# Patient Record
Sex: Female | Born: 1945 | Race: White | Hispanic: No | Smoking: Never smoker
Health system: Southern US, Community
[De-identification: ages and names within clinical notes are randomized; demographics above are authoritative.]

## PROBLEM LIST (undated history)

## (undated) DIAGNOSIS — D649 Anemia, unspecified: Secondary | ICD-10-CM

## (undated) DIAGNOSIS — E785 Hyperlipidemia, unspecified: Secondary | ICD-10-CM

## (undated) DIAGNOSIS — K900 Celiac disease: Secondary | ICD-10-CM

## (undated) DIAGNOSIS — R112 Nausea with vomiting, unspecified: Secondary | ICD-10-CM

## (undated) DIAGNOSIS — K449 Diaphragmatic hernia without obstruction or gangrene: Secondary | ICD-10-CM

## (undated) DIAGNOSIS — Z9889 Other specified postprocedural states: Secondary | ICD-10-CM

## (undated) DIAGNOSIS — E0500 Thyrotoxicosis with diffuse goiter without thyrotoxic crisis or storm: Secondary | ICD-10-CM

## (undated) DIAGNOSIS — J45909 Unspecified asthma, uncomplicated: Secondary | ICD-10-CM

## (undated) DIAGNOSIS — R011 Cardiac murmur, unspecified: Secondary | ICD-10-CM

## (undated) DIAGNOSIS — E039 Hypothyroidism, unspecified: Secondary | ICD-10-CM

## (undated) DIAGNOSIS — K219 Gastro-esophageal reflux disease without esophagitis: Secondary | ICD-10-CM

## (undated) DIAGNOSIS — I10 Essential (primary) hypertension: Secondary | ICD-10-CM

## (undated) DIAGNOSIS — Z9289 Personal history of other medical treatment: Secondary | ICD-10-CM

## (undated) DIAGNOSIS — G43909 Migraine, unspecified, not intractable, without status migrainosus: Secondary | ICD-10-CM

## (undated) DIAGNOSIS — Z8739 Personal history of other diseases of the musculoskeletal system and connective tissue: Secondary | ICD-10-CM

## (undated) DIAGNOSIS — J302 Other seasonal allergic rhinitis: Secondary | ICD-10-CM

## (undated) DIAGNOSIS — J189 Pneumonia, unspecified organism: Secondary | ICD-10-CM

## (undated) DIAGNOSIS — M1990 Unspecified osteoarthritis, unspecified site: Secondary | ICD-10-CM

## (undated) DIAGNOSIS — I639 Cerebral infarction, unspecified: Secondary | ICD-10-CM

## (undated) HISTORY — PX: COLONOSCOPY: SHX174

## (undated) HISTORY — DX: Diaphragmatic hernia without obstruction or gangrene: K44.9

## (undated) HISTORY — DX: Gastro-esophageal reflux disease without esophagitis: K21.9

## (undated) HISTORY — DX: Unspecified osteoarthritis, unspecified site: M19.90

## (undated) HISTORY — DX: Cerebral infarction, unspecified: I63.9

## (undated) HISTORY — DX: Anemia, unspecified: D64.9

## (undated) HISTORY — PX: THYROIDECTOMY: SHX17

## (undated) HISTORY — PX: SHOULDER ARTHROSCOPY W/ ROTATOR CUFF REPAIR: SHX2400

## (undated) HISTORY — PX: TONSILLECTOMY: SUR1361

## (undated) HISTORY — DX: Thyrotoxicosis with diffuse goiter without thyrotoxic crisis or storm: E05.00

## (undated) HISTORY — DX: Essential (primary) hypertension: I10

## (undated) HISTORY — DX: Hyperlipidemia, unspecified: E78.5

## (undated) HISTORY — DX: Cardiac murmur, unspecified: R01.1

## (undated) HISTORY — DX: Celiac disease: K90.0

## (undated) HISTORY — DX: Unspecified asthma, uncomplicated: J45.909

## (undated) HISTORY — PX: POLYPECTOMY: SHX149

---

## 1998-07-09 ENCOUNTER — Other Ambulatory Visit: Admission: RE | Admit: 1998-07-09 | Discharge: 1998-07-09 | Payer: Self-pay | Admitting: Obstetrics and Gynecology

## 1999-01-03 ENCOUNTER — Other Ambulatory Visit: Admission: RE | Admit: 1999-01-03 | Discharge: 1999-01-03 | Payer: Self-pay | Admitting: Obstetrics and Gynecology

## 2007-09-09 ENCOUNTER — Ambulatory Visit: Payer: Self-pay | Admitting: Internal Medicine

## 2007-10-19 ENCOUNTER — Ambulatory Visit: Payer: Self-pay | Admitting: Internal Medicine

## 2007-10-19 ENCOUNTER — Encounter: Payer: Self-pay | Admitting: Internal Medicine

## 2007-10-19 DIAGNOSIS — K5730 Diverticulosis of large intestine without perforation or abscess without bleeding: Secondary | ICD-10-CM | POA: Insufficient documentation

## 2007-10-19 DIAGNOSIS — K210 Gastro-esophageal reflux disease with esophagitis: Secondary | ICD-10-CM

## 2007-10-19 DIAGNOSIS — D126 Benign neoplasm of colon, unspecified: Secondary | ICD-10-CM

## 2008-03-16 DIAGNOSIS — E7800 Pure hypercholesterolemia, unspecified: Secondary | ICD-10-CM | POA: Insufficient documentation

## 2008-03-16 DIAGNOSIS — K900 Celiac disease: Secondary | ICD-10-CM | POA: Insufficient documentation

## 2008-03-16 DIAGNOSIS — E785 Hyperlipidemia, unspecified: Secondary | ICD-10-CM

## 2008-03-16 DIAGNOSIS — I10 Essential (primary) hypertension: Secondary | ICD-10-CM | POA: Insufficient documentation

## 2008-03-16 DIAGNOSIS — E039 Hypothyroidism, unspecified: Secondary | ICD-10-CM | POA: Insufficient documentation

## 2008-03-16 DIAGNOSIS — K219 Gastro-esophageal reflux disease without esophagitis: Secondary | ICD-10-CM

## 2009-01-03 ENCOUNTER — Ambulatory Visit: Payer: Self-pay | Admitting: Internal Medicine

## 2009-01-03 DIAGNOSIS — R05 Cough: Secondary | ICD-10-CM

## 2009-01-03 DIAGNOSIS — R930 Abnormal findings on diagnostic imaging of skull and head, not elsewhere classified: Secondary | ICD-10-CM | POA: Insufficient documentation

## 2009-01-21 ENCOUNTER — Telehealth (INDEPENDENT_AMBULATORY_CARE_PROVIDER_SITE_OTHER): Payer: Self-pay | Admitting: *Deleted

## 2009-01-24 ENCOUNTER — Telehealth (INDEPENDENT_AMBULATORY_CARE_PROVIDER_SITE_OTHER): Payer: Self-pay | Admitting: *Deleted

## 2009-01-24 ENCOUNTER — Ambulatory Visit: Payer: Self-pay | Admitting: Internal Medicine

## 2009-01-25 ENCOUNTER — Ambulatory Visit: Payer: Self-pay | Admitting: Cardiovascular Disease

## 2009-02-08 ENCOUNTER — Ambulatory Visit: Payer: Self-pay | Admitting: Internal Medicine

## 2009-02-08 DIAGNOSIS — L509 Urticaria, unspecified: Secondary | ICD-10-CM

## 2009-02-20 ENCOUNTER — Ambulatory Visit: Payer: Self-pay | Admitting: Internal Medicine

## 2009-05-06 ENCOUNTER — Ambulatory Visit (HOSPITAL_COMMUNITY): Admission: RE | Admit: 2009-05-06 | Discharge: 2009-05-06 | Payer: Self-pay | Admitting: Endocrinology

## 2009-05-13 ENCOUNTER — Ambulatory Visit: Payer: Self-pay | Admitting: Internal Medicine

## 2009-06-21 ENCOUNTER — Ambulatory Visit: Payer: Self-pay | Admitting: Internal Medicine

## 2009-06-21 DIAGNOSIS — J328 Other chronic sinusitis: Secondary | ICD-10-CM | POA: Insufficient documentation

## 2009-06-21 LAB — CONVERTED CEMR LAB
Eosinophils Absolute: 0.2 10*3/uL (ref 0.0–0.7)
Eosinophils Relative: 2 % (ref 0.0–5.0)
HCT: 39.5 % (ref 36.0–46.0)
Hemoglobin: 14.2 g/dL (ref 12.0–15.0)
Lymphocytes Relative: 27.2 % (ref 12.0–46.0)
Monocytes Absolute: 0.5 10*3/uL (ref 0.1–1.0)
Neutrophils Relative %: 64.3 % (ref 43.0–77.0)
RBC: 4.14 M/uL (ref 3.87–5.11)
Sed Rate: 23 mm/hr — ABNORMAL HIGH (ref 0–22)
WBC: 7.7 10*3/uL (ref 4.5–10.5)

## 2009-07-26 ENCOUNTER — Ambulatory Visit: Payer: Self-pay | Admitting: Internal Medicine

## 2009-08-16 ENCOUNTER — Ambulatory Visit: Payer: Self-pay | Admitting: Internal Medicine

## 2009-08-16 ENCOUNTER — Telehealth: Payer: Self-pay | Admitting: Internal Medicine

## 2009-08-22 ENCOUNTER — Telehealth: Payer: Self-pay | Admitting: Critical Care Medicine

## 2009-08-26 DIAGNOSIS — G43009 Migraine without aura, not intractable, without status migrainosus: Secondary | ICD-10-CM | POA: Insufficient documentation

## 2009-09-09 ENCOUNTER — Telehealth (INDEPENDENT_AMBULATORY_CARE_PROVIDER_SITE_OTHER): Payer: Self-pay | Admitting: *Deleted

## 2010-04-28 ENCOUNTER — Telehealth: Payer: Self-pay | Admitting: Internal Medicine

## 2010-08-25 ENCOUNTER — Encounter: Admission: RE | Admit: 2010-08-25 | Discharge: 2010-08-25 | Payer: Self-pay | Admitting: Orthopedic Surgery

## 2011-01-29 NOTE — Progress Notes (Signed)
Summary: requesting to change doctors  Phone Note Call from Patient Call back at [phone]   Caller: Patient Call For: wert Reason for Call: Talk to Nurse, Talk to Doctor Summary of Call: Patient is a Dr. Melvyn Novas patient. Patient calling to request to change doctors to Dr. Joya Gaskins. Initial call taken by: Mateo Flow,  August 22, 2009 12:47 PM  Follow-up for Phone Call        Dr. Melvyn Novas please advise.  Follow-up by: Shelton Bing CMA,  August 22, 2009 4:29 PM  Additional Follow-up for Phone Call Additional follow up Details #1::        1)needs to keep ov with Tammy if she wants our help.  2)  already sees Dr Annamaria Boots - she should see him after Tammy NP and they can decide whether a third pulmonary opinion is warranted or not Additional Follow-up by: Tanda Rockers MD,  August 22, 2009 5:17 PM    Additional Follow-up for Phone Call Additional follow up Details #2::    Pt is scheduled to see TP on 9/13 and follows Dr. Annamaria Boots for allergies. Pt request to see Dr. Joya Gaskins for pulmonary. Dr. Joya Gaskins please advise.  Follow-up by: [place] Bing CMA,  August 22, 2009 5:24 PM  Additional Follow-up for Phone Call Additional follow up Details #3:: Details for Additional Follow-up Action Taken: she should see Annamaria Boots and Parrett in my opinion  pw Additional Follow-up by: Elsie Stain MD,  August 22, 2009 5:26 PM   Appended Document: requesting to change doctors Spoke to pt and advised that MW and PW recs were to see TP and CY and discuss her issues with CY. Pt was aggitated with this response, but agreed tothe plan.

## 2011-01-29 NOTE — Progress Notes (Signed)
Summary: Needs f/u chest ct due to MPN- To be done by Dr Forde Dandy  ---- Converted from flag ---- ---- 05/13/2009 3:01 PM, Tanda Rockers MD wrote: ct of chest due for f/u MPN ------------------------------  Dr. Melvyn Novas, it looks like this pt switched to Dr Annamaria Boots, but she never followed up with TP or Dr Annamaria Boots, she cancelled her last 3 appts.  Please advise, thanks Tilden Dome  Apr 28, 2010 10:23 AM  Chest CT 05/06/09 showed multiple pulmonary nodules in LLL but pt declined f/u here. Let pt know we have deferred this issue to Dr Forde Dandy since she declined to f/u here and that we strongly rec the f/u scan but we cannot force her to have one.  Hanna Raskin  Apr 28, 2010 3:21 PM  Spoke with pt and informed of the above.  She agrees to have scan done and will call Dr Forde Dandy to get this set up. Tilden Dome  Apr 29, 2010 2:30 PM   Thank you. Tanda Rockers MD  Apr 29, 2010 4:43 PM

## 2011-05-12 NOTE — Assessment & Plan Note (Signed)
[facility]                         GASTROENTEROLOGY OFFICE NOTE   ARLEN, LEGENDRE                        MRN:          [id number]  DATE:09/09/2007                            DOB:          [birth year]    REFERRING PHYSICIAN:  Ishmael Holter. [place], M.D.   OFFICE CONSULTATION NOTE   REASON FOR CONSULTATION:  Celiac disease.   HISTORY:  This is a 65 year old white female with a history of  hypertension, hypothyroidism, hyperlipidemia, and gastroesophageal  reflux disease.  She is referred through the courtesy of Dr. Forde Dandy  regarding newly identified celiac disease.  The patient's twin sister is  followed in this office and is known to have celiac disease.  As such,  the patient underwent antibody testing July 29, 2007.  Antigliadin,  antiendomysial, and tissue transglutaminase antibodies were all markedly  positive.  The patient also had a low vitamin D level.  Her liver  function tests were normal.  As well, her hemoglobin was normal.  In  addition to intermittent reflux, for which she takes Nexium, she does  report occasional urgency with loose stools, which are somewhat  explosive depending upon diet.  She has started to eliminate gluten from  her diet, and she states this makes her feel better.  She has not had  prior GI evaluations.   PAST MEDICAL HISTORY:  1. Hypertension.  2. Dyslipidemia.  3. Hypothyroidism.   PAST SURGICAL HISTORY:  Thyroid surgery in 1980 and again in 1986.   ALLERGIES:  CODEINE.   CURRENT MEDICATIONS:  1. Synthroid 225 mcg daily.  2. Hydrochlorothiazide 25 mg daily.  3. Nexium 40 mg daily.  4. Altace 10 mg daily.  5. Vitamin D once weekly.  6. Ampicillin 500 mg daily.   FAMILY HISTORY:  A twin sister with celiac sprue.  No gastrointestinal  malignancy.   SOCIAL HISTORY:  The patient is widowed with 3 children.  She lives  alone.  She attended high school.  She works as an Passenger transport manager.  She  does not  smoke.  She occasionally uses alcohol.   REVIEW OF SYSTEMS:  Per diagnostic evaluation form.   PHYSICAL EXAM:  Well-appearing female in no acute distress.  Blood pressure 152/90, heart rate is 88, weight 165.4 pounds.  She is 5  feet 6 inches in height.  HEENT:  Sclerae anicteric, conjunctivae pink, no adenopathy.  LUNGS:  Clear.  HEART:  Regular.  ABDOMEN:  Soft without tenderness, mass, or hernia.  Good bowel sounds  heard.  No skin rash on the extremities.   IMPRESSION:  1. Celiac disease.  2. Colon cancer screening.  Baseline risk.   RECOMMENDATIONS:  1. Upper endoscopy with duodenal biopsies to confirm the diagnosis,      though there is little doubt.  2. Discussed today celiac disease, as well reviewed multiple web sites      regarding the disorder and dietary measures.  Offered her an      opportunity to see a dietician, though she states this was not      particularly helpful for her sister.  3. Colonoscopy  with polypectomy if necessary.  The nature of the      procedure as well as the risks, benefits, and alternatives were      reviewed.  She understood and agreed to proceed.     Docia Chuck. Henrene Pastor, MD  Electronically Signed    JNP/MedQ  DD: 09/09/2007  DT: 09/09/2007  Job #: [id number]   cc:   Annie Main A. Forde Dandy, M.D.

## 2011-09-11 ENCOUNTER — Telehealth: Payer: Self-pay | Admitting: *Deleted

## 2011-09-16 NOTE — Telephone Encounter (Signed)
Error

## 2011-10-09 ENCOUNTER — Other Ambulatory Visit: Payer: Self-pay | Admitting: Dermatology

## 2012-10-06 ENCOUNTER — Encounter: Payer: Self-pay | Admitting: Internal Medicine

## 2013-07-07 ENCOUNTER — Encounter: Payer: Self-pay | Admitting: Internal Medicine

## 2013-11-13 DIAGNOSIS — E890 Postprocedural hypothyroidism: Secondary | ICD-10-CM | POA: Insufficient documentation

## 2015-04-18 ENCOUNTER — Encounter: Payer: Self-pay | Admitting: Internal Medicine

## 2015-12-08 ENCOUNTER — Ambulatory Visit (INDEPENDENT_AMBULATORY_CARE_PROVIDER_SITE_OTHER): Payer: Managed Care, Other (non HMO) | Admitting: Family Medicine

## 2015-12-08 VITALS — BP 120/80 | HR 86 | Temp 98.1°F | Resp 16 | Ht 66.0 in | Wt 155.0 lb

## 2015-12-08 DIAGNOSIS — S93602A Unspecified sprain of left foot, initial encounter: Secondary | ICD-10-CM

## 2015-12-08 MED ORDER — PREDNISONE 20 MG PO TABS: ORAL_TABLET | ORAL | Status: DC

## 2015-12-08 NOTE — Patient Instructions (Signed)
This has the features of a foot strain.

## 2015-12-08 NOTE — Progress Notes (Signed)
69 yo woman with new shoes as of last Thursday with subsequent left foot pain and swelling over the proximal dorsum.  No trauma  Tried ibuprofen  Objective:  NAD BP 120/80 mmHg  Pulse 86  Temp(Src) 98.1 F (36.7 C)  Resp 16  Ht 5\' 6"  (1.676 m)  Wt 155 lb (70.308 kg)  BMI 25.03 kg/m2  SpO2 98% Left foot is tender over the proximal dorsum of the left foot. There is mild swelling over the dorsum of the foot as well without ecchymosis. There is no lateral bony tenderness and she has full range of motion.  Assessment. This chart was scribed in my presence and reviewed by me personally.    ICD-9-CM ICD-10-CM   1. Foot sprain, left, initial encounter 845.10 S93.602A predniSONE (DELTASONE) 20 MG tablet     Signed, Robyn Haber, MD

## 2016-01-17 ENCOUNTER — Telehealth: Payer: Self-pay | Admitting: Internal Medicine

## 2016-01-17 NOTE — Telephone Encounter (Signed)
Patient returned phone call. Best # [phone]

## 2016-01-17 NOTE — Telephone Encounter (Signed)
Left message for patient to call back  

## 2016-01-17 NOTE — Telephone Encounter (Signed)
Patient with a history of celiac disease.  She reports that she has been having lower abdominal cramping and diarrhea.  She will come in and see Nicoletta Ba PA on 01/20/16 2:00

## 2016-01-20 ENCOUNTER — Ambulatory Visit (INDEPENDENT_AMBULATORY_CARE_PROVIDER_SITE_OTHER): Payer: Managed Care, Other (non HMO) | Admitting: Physician Assistant

## 2016-01-20 ENCOUNTER — Encounter: Payer: Self-pay | Admitting: Physician Assistant

## 2016-01-20 ENCOUNTER — Other Ambulatory Visit (INDEPENDENT_AMBULATORY_CARE_PROVIDER_SITE_OTHER): Payer: Managed Care, Other (non HMO)

## 2016-01-20 VITALS — BP 94/60 | HR 92 | Ht 64.75 in | Wt 149.4 lb

## 2016-01-20 DIAGNOSIS — K900 Celiac disease: Secondary | ICD-10-CM | POA: Diagnosis not present

## 2016-01-20 DIAGNOSIS — R197 Diarrhea, unspecified: Secondary | ICD-10-CM

## 2016-01-20 DIAGNOSIS — A09 Infectious gastroenteritis and colitis, unspecified: Secondary | ICD-10-CM

## 2016-01-20 DIAGNOSIS — Z86010 Personal history of colonic polyps: Secondary | ICD-10-CM | POA: Diagnosis not present

## 2016-01-20 LAB — CBC WITH DIFFERENTIAL/PLATELET
BASOS PCT: 0.5 % (ref 0.0–3.0)
Basophils Absolute: 0 10*3/uL (ref 0.0–0.1)
EOS PCT: 2.1 % (ref 0.0–5.0)
Eosinophils Absolute: 0.1 10*3/uL (ref 0.0–0.7)
HCT: 35.1 % — ABNORMAL LOW (ref 36.0–46.0)
HEMOGLOBIN: 11.8 g/dL — AB (ref 12.0–15.0)
LYMPHS ABS: 1.8 10*3/uL (ref 0.7–4.0)
Lymphocytes Relative: 28.7 % (ref 12.0–46.0)
MCHC: 33.6 g/dL (ref 30.0–36.0)
MCV: 93.2 fl (ref 78.0–100.0)
MONO ABS: 0.6 10*3/uL (ref 0.1–1.0)
MONOS PCT: 9 % (ref 3.0–12.0)
Neutro Abs: 3.8 10*3/uL (ref 1.4–7.7)
Neutrophils Relative %: 59.7 % (ref 43.0–77.0)
Platelets: 226 10*3/uL (ref 150.0–400.0)
RBC: 3.76 Mil/uL — ABNORMAL LOW (ref 3.87–5.11)
RDW: 12.8 % (ref 11.5–15.5)
WBC: 6.4 10*3/uL (ref 4.0–10.5)

## 2016-01-20 LAB — COMPREHENSIVE METABOLIC PANEL
ALBUMIN: 4.5 g/dL (ref 3.5–5.2)
ALK PHOS: 41 U/L (ref 39–117)
ALT: 15 U/L (ref 0–35)
AST: 19 U/L (ref 0–37)
BUN: 74 mg/dL — AB (ref 6–23)
CALCIUM: 9.8 mg/dL (ref 8.4–10.5)
CHLORIDE: 101 meq/L (ref 96–112)
CO2: 25 mEq/L (ref 19–32)
Creatinine, Ser: 2.29 mg/dL — ABNORMAL HIGH (ref 0.40–1.20)
GFR: 22.56 mL/min — AB (ref >60.00)
Glucose, Bld: 99 mg/dL (ref 70–99)
POTASSIUM: 3.7 meq/L (ref 3.5–5.1)
SODIUM: 139 meq/L (ref 135–145)
TOTAL PROTEIN: 7.4 g/dL (ref 6.0–8.3)
Total Bilirubin: 0.5 mg/dL (ref 0.2–1.2)

## 2016-01-20 MED ORDER — NA SULFATE-K SULFATE-MG SULF 17.5-3.13-1.6 GM/177ML PO SOLN: 1.0000 | Freq: Once | ORAL | Status: AC

## 2016-01-20 NOTE — Progress Notes (Signed)
Patient ID: Felicia Washington, female   DOB: 1946-07-21, 70 y.o.   MRN: [id number]   Subjective:    Patient ID: Felicia Washington, female    DOB: 1946-06-02, 70 y.o.   MRN: [id number]  HPI  Felicia Washington  Is a pleasant 70 year old white female known to Dr. Henrene Pastor who has not been seen here since 2008. She has a diagnosis of celiac disease and history of diverticulosis and adenomatous colon polyps. Her last colonoscopy was in October 2008 she had marked diverticulosis at that time and 2 polyps were removed 8 mm and 3 mm in size , both tubular adenomas. She was due for follow-up in 2013 .  Patient comes in today with complaints of acute diarrheal illness. She says she had been doing very well and follows a strict gluten-free diet at home, says she may get some gluten when she eats out but other than that has been fairly strict with her diet. He had onset about a week and a half ago of an acute diarrheal illness with multiple watery stools per day nonbloody and at least 8-10 episodes per day. She says a lot of this was occurring in the middle of the night. She had some mild abdominal discomfort no nausea vomiting fever chills etc. She says after couple of days of diarrhea she began developing pretty intense cramping in her legs and toes. She started on an over-the-counter k+ supplement.   says over the past today she's been feeling better and today has not had any diarrhea. She was concerned about whether this was due to celiac disease or some other issue. She's not been on any new medications, no recent antibiotics other than low dose amoxicillin she takes for her face, and no known infectious exposures.  She states she knew she was overdue for colonoscopy.  Review of Systems Pertinent positive and negative review of systems were noted in the above HPI section.  All other review of systems was otherwise negative.  Outpatient Encounter Prescriptions as of 01/20/2016  Medication Sig  . albuterol (PROAIR HFA) 108 (90 Base)  MCG/ACT inhaler Inhale 2 puffs into the lungs every 6 (six) hours as needed for wheezing or shortness of breath.  Marland Kitchen atorvastatin (LIPITOR) 40 MG tablet Take 40 mg by mouth daily.  . Choline Fenofibrate (FENOFIBRIC ACID) 135 MG CPDR Take 1 tablet by mouth daily.  Marland Kitchen levothyroxine (SYNTHROID, LEVOTHROID) 125 MCG tablet Take 125 mcg by mouth daily before breakfast.  . montelukast (SINGULAIR) 10 MG tablet Take 10 mg by mouth at bedtime.  Marland Kitchen olmesartan (BENICAR) 40 MG tablet Take 40 mg by mouth daily.  Marland Kitchen omeprazole (PRILOSEC) 40 MG capsule Take 40 mg by mouth daily.  . hydrochlorothiazide (HYDRODIURIL) 25 MG tablet Take 25 mg by mouth daily. Reported on 01/20/2016  . Na Sulfate-K Sulfate-Mg Sulf SOLN Take 1 kit by mouth once.  . [DISCONTINUED] fenofibrate (TRICOR) 145 MG tablet Take 145 mg by mouth daily. Reported on 01/20/2016  . [DISCONTINUED] predniSONE (DELTASONE) 20 MG tablet Two daily with food   No facility-administered encounter medications on file as of 01/20/2016.   Allergies  Allergen Reactions  . Codeine    Patient Active Problem List   Diagnosis Date Noted  . 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 URTICARIA 02/08/2009  . COUGH 01/03/2009  . Nonspecific (abnormal) findings on radiological and other examination of body structure 01/03/2009  . ABNORMAL LUNG XRAY 01/03/2009  . HYPOTHYROIDISM 03/16/2008  . DYSLIPIDEMIA 03/16/2008  . HYPERTENSION 03/16/2008  . GERD 03/16/2008  .  CELIAC DISEASE 03/16/2008  . COLONIC POLYPS 10/19/2007  . ESOPHAGITIS, REFLUX 10/19/2007  . DIVERTICULOSIS, COLON 68/22/2008   Social History   Social History  . Marital Status: Widowed    Spouse Name: N/A  . Number of Children: 3  . Years of Education: N/A   Occupational History  . sales    Social History Main Topics  . Smoking status: Never Smoker   . Smokeless tobacco: Never Used  . Alcohol Use: No  . Drug Use: No  . Sexual Activity: Not on file   Other Topics Concern  . Not on file    Social History Narrative    [name]'s family history includes Bone cancer in her father; Celiac disease in her sister; Gallstones in her mother; Heart attack in her maternal grandfather; Multiple myeloma in her father; Thyroid disease in her mother.      Objective:    Filed Vitals:   01/20/16 1348  BP: 94/60  Pulse: 92    Physical Exam   Well-developed older white female in no acute distress, blood pressure 94/60 pulse 92 height 5 foot 4 weight 149 . HEENT: nontraumatic normocephalic EOMI PERRLA sclera anicteric, Cardiovascular :regular rate and rhythm with S1-S2 no murmur or gallop, Pulmonary :clear bilaterally, Abdomen soft bowel sounds are present she is nontender there is no palpable mass or hepatosplenomegaly , Rectal :exam not done, Extremities: no clubbing cyanosis or edema skin warm and dry, Neuropsych :mood and affect appropriate     Assessment & Plan:   #1 70 yo female with acute diarrheal illness x 68 days -resolving Suspect infectious/viral gastroenteritis #2 Celiac disease- previously asymptomatic on gluten free diet #3Hx adenomatous polyps- overdue for follow up #4 Diverticulosis #5 HTN #6 GERD  Plan; check CBC,BMET,TTG Stool cultures only if diarrhea recurs  Bland diet, push fluids  Add daily Align x one month Schedule for Colonoscopy with Dr Henrene Pastor- procedures discussed in detail with pt and she is agreeable to proceed    Alfredia Ferguson PA-C 01/20/2016   Cc: No ref. provider found

## 2016-01-20 NOTE — Progress Notes (Signed)
Agree with assessment and plans

## 2016-01-20 NOTE — Patient Instructions (Signed)
Please go to the basement level to have your labs drawn.  Call us if diarrhea reoccurs, we will do stool cultures.  You have been scheduled for a colonoscopy. Please follow written instructions given to you at your visit today.  Please pick up your prep supplies at the pharmacy within the next 1-3 days. [place]  If you use inhalers (even only as needed), please bring them with you on the day of your procedure. Your physician has requested that you go to www.startemmi.com and enter the access code given to you at your visit today. This web site gives a general overview about your procedure. However, you should still follow specific instructions given to you by our office regarding your preparation for the procedure.  Start Align, 1 capsule daily for one month. You can get this at the pharmacy, [address], [place], [place]. [employer].

## 2016-01-21 LAB — TISSUE TRANSGLUTAMINASE, IGG: Tissue Transglut Ab: 3 U/mL (ref <6)

## 2016-03-24 ENCOUNTER — Encounter: Payer: Self-pay | Admitting: Internal Medicine

## 2016-03-24 ENCOUNTER — Ambulatory Visit (AMBULATORY_SURGERY_CENTER): Payer: Managed Care, Other (non HMO) | Admitting: Internal Medicine

## 2016-03-24 VITALS — BP 130/78 | HR 72 | Temp 98.0°F | Resp 24 | Ht 64.75 in | Wt 149.0 lb

## 2016-03-24 DIAGNOSIS — Z86010 Personal history of colonic polyps: Secondary | ICD-10-CM

## 2016-03-24 MED ORDER — SODIUM CHLORIDE 0.9 % IV SOLN: 500.0000 mL | INTRAVENOUS | Status: DC

## 2016-03-24 NOTE — Patient Instructions (Addendum)
FOLLOW DISCHARGE INSTRUCTIONS (BLUE AND GREEN SHEETS).YOU HAD AN ENDOSCOPIC PROCEDURE TODAY AT [facility]:   Refer to the procedure report that was given to you for any specific questions about what was found during the examination.  If the procedure report does not answer your questions, please call your gastroenterologist to clarify.  If you requested that your care partner not be given the details of your procedure findings, then the procedure report has been included in a sealed envelope for you to review at your convenience later.  YOU SHOULD EXPECT: Some feelings of bloating in the abdomen. Passage of more gas than usual.  Walking can help get rid of the air that was put into your GI tract during the procedure and reduce the bloating. If you had a lower endoscopy (such as a colonoscopy or flexible sigmoidoscopy) you may notice spotting of blood in your stool or on the toilet paper. If you underwent a bowel prep for your procedure, you may not have a normal bowel movement for a few days.  Please Note:  You might notice some irritation and congestion in your nose or some drainage.  This is from the oxygen used during your procedure.  There is no need for concern and it should clear up in a day or so.  SYMPTOMS TO REPORT IMMEDIATELY:   Following lower endoscopy (colonoscopy or flexible sigmoidoscopy):  Excessive amounts of blood in the stool  Significant tenderness or worsening of abdominal pains  Swelling of the abdomen that is new, acute  Fever of 100F or higher  For urgent or emergent issues, a gastroenterologist can be reached at any hour by calling [phone].   DIET: Your first meal following the procedure should be a small meal and then it is ok to progress to your normal diet. Heavy or fried foods are harder to digest and may make you feel nauseous or bloated.  Likewise, meals heavy in dairy and vegetables can increase bloating.  Drink plenty of fluids but you  should avoid alcoholic beverages for 24 hours.  ACTIVITY:  You should plan to take it easy for the rest of today and you should NOT DRIVE or use heavy machinery until tomorrow (because of the sedation medicines used during the test).    FOLLOW UP: Our staff will call the number listed on your records the next business day following your procedure to check on you and address any questions or concerns that you may have regarding the information given to you following your procedure. If we do not reach you, we will leave a message.  However, if you are feeling well and you are not experiencing any problems, there is no need to return our call.  We will assume that you have returned to your regular daily activities without incident.  If any biopsies were taken you will be contacted by phone or by letter within the next 1-3 weeks.  Please call us at [phone] if you have not heard about the biopsies in 3 weeks.    SIGNATURES/CONFIDENTIALITY: You and/or your care partner have signed paperwork which will be entered into your electronic medical record.  These signatures attest to the fact that that the information above on your After Visit Summary has been reviewed and is understood.  Full responsibility of the confidentiality of this discharge information lies with you and/or your care-partner.  Diverticulosis information given.  Recall 10 years-2027.

## 2016-03-24 NOTE — Progress Notes (Signed)
Patient awakening,vss,report to rn 

## 2016-03-24 NOTE — Op Note (Signed)
[facility] Patient Name: Felicia Washington Procedure Date: 03/24/2016 8:03 AM MRN: [id number] Endoscopist: Docia Chuck. Henrene Pastor , MD Age: 70 Referring MD:  Date of Birth: [birth year] Gender: Female Procedure:                Colonoscopy Indications:              Surveillance: Personal history of adenomatous                            polyps on last colonoscopy > 5 years ago Medicines:                Monitored Anesthesia Care Procedure:                Pre-Anesthesia Assessment:                           - Prior to the procedure, a History and Physical                            was performed, and patient medications and                            allergies were reviewed. The patient's tolerance of                            previous anesthesia was also reviewed. The risks                            and benefits of the procedure and the sedation                            options and risks were discussed with the patient.                            All questions were answered, and informed consent                            was obtained. Prior Anticoagulants: The patient has                            taken no previous anticoagulant or antiplatelet                            agents. ASA Grade Assessment: II - A patient with                            mild systemic disease. After reviewing the risks                            and benefits, the patient was deemed in                            satisfactory condition to undergo the procedure.  After obtaining informed consent, the colonoscope                            was passed under direct vision. Throughout the                            procedure, the patient's blood pressure, pulse, and                            oxygen saturations were monitored continuously. The                            Model CF-[id number]) scope was introduced                            through the anus and advanced to the the  cecum,                            identified by appendiceal orifice and ileocecal                            valve. The colonoscopy was performed with ease. The                            patient tolerated the procedure well. The quality                            of the bowel preparation was excellent. The bowel                            preparation used was SUPREP. The ileocecal valve,                            appendiceal orifice, and rectum were photographed. Scope In: 8:26:58 AM Scope Out: 8:35:30 AM Scope Withdrawal Time: 0 hours 6 minutes 40 seconds  Total Procedure Duration: 0 hours 8 minutes 32 seconds  Findings:      The perianal and digital rectal examinations were normal.      Multiple small and large-mouthed diverticula were found in the entire       colon.      The exam was otherwise without abnormality on direct and retroflexion       views. Complications:            No immediate complications. Estimated Blood Loss:     Estimated blood loss: none. Impression:               - Diverticulosis in the entire examined colon.                           - The examination was otherwise normal on direct                            and retroflexion views.                           -  No specimens collected. Recommendation:           - Patient has a contact number available for                            emergencies. The signs and symptoms of potential                            delayed complications were discussed with the                            patient. Return to normal activities tomorrow.                            Written discharge instructions were provided to the                            patient.                           - Resume previous diet.                           - Continue present medications.                           - Repeat colonoscopy in 10 years for surveillance.                           - Return to referring physician PRN. Procedure Code(s):        ---  Professional ---                           [phone], Colonoscopy, flexible; diagnostic, including                            collection of specimen(s) by brushing or washing,                            when performed (separate procedure) CPT copyright 2016 American Medical Association. All rights reserved. Docia Chuck. Henrene Pastor, MD 03/24/2016 8:45:43 AM This report has been signed electronically. Number of Addenda: 0 Referring MD:      Reynold Bowen

## 2016-03-25 ENCOUNTER — Telehealth: Payer: Self-pay

## 2016-03-25 NOTE — Telephone Encounter (Signed)
  Follow up Call-  Call back number 03/24/2016  Post procedure Call Back phone  # [phone]  Permission to leave phone message Yes    Patient states she was extremely happy with the care that she received while in the [facility]oscopy Center.   Patient questions:  Do you have a fever, pain , or abdominal swelling? No. Pain Score  0 *  Have you tolerated food without any problems? Yes.    Have you been able to return to your normal activities? Yes.    Do you have any questions about your discharge instructions: Diet   No. Medications  No. Follow up visit  No.  Do you have questions or concerns about your Care? No.  Actions: * If pain score is 4 or above: No action needed, pain <4.

## 2016-08-11 ENCOUNTER — Other Ambulatory Visit: Payer: Self-pay | Admitting: Internal Medicine

## 2016-08-11 DIAGNOSIS — N179 Acute kidney failure, unspecified: Secondary | ICD-10-CM

## 2016-08-11 DIAGNOSIS — N189 Chronic kidney disease, unspecified: Principal | ICD-10-CM

## 2016-08-13 ENCOUNTER — Ambulatory Visit
Admission: RE | Admit: 2016-08-13 | Discharge: 2016-08-13 | Disposition: A | Discharge status: Home / Self Care | Payer: Managed Care, Other (non HMO) | Source: Ambulatory Visit | Attending: Internal Medicine | Admitting: Internal Medicine

## 2016-08-13 DIAGNOSIS — N179 Acute kidney failure, unspecified: Secondary | ICD-10-CM

## 2016-08-13 DIAGNOSIS — N189 Chronic kidney disease, unspecified: Principal | ICD-10-CM

## 2016-09-07 ENCOUNTER — Telehealth: Payer: Self-pay | Admitting: Internal Medicine

## 2016-09-07 NOTE — Telephone Encounter (Signed)
Returned pts call and she states she has "already spoken to someone so she is ok."

## 2018-01-26 DIAGNOSIS — M75100 Unspecified rotator cuff tear or rupture of unspecified shoulder, not specified as traumatic: Secondary | ICD-10-CM | POA: Insufficient documentation

## 2018-06-17 DIAGNOSIS — M109 Gout, unspecified: Secondary | ICD-10-CM | POA: Insufficient documentation

## 2018-08-22 ENCOUNTER — Encounter (HOSPITAL_COMMUNITY): Payer: Self-pay

## 2018-08-22 NOTE — Pre-Procedure Instructions (Signed)
Orphia Mctigue  08/22/2018      [id number] [place], [address] AT [facility] OF [address] [place] [address] Phone: [phone] Fax: [phone]    Your procedure is scheduled on Thursday September 5.  Report to [facility] Admitting at 5:30 A.M.  Call this number if you have problems the morning of surgery:  [phone]   Remember:  Do not eat or drink after midnight.    Take these medicines the morning of surgery with A SIP OF WATER:   Omeprazole (prilosec) Allopurinol (Zyloprim) Ampicillin (principen) Doxycycline (Vibramycin) Levothyroxine (synthroid) Albuterol if needed (please bring to hospital with you)  7 days prior to surgery STOP taking any Aspirin(unless otherwise instructed by your surgeon), Aleve, Naproxen, Ibuprofen, Motrin, Advil, Goody's, BC's, all herbal medications, fish oil, and all vitamins     Do not wear jewelry, make-up or nail polish.  Do not wear lotions, powders, or perfumes, or deodorant.  Do not shave 48 hours prior to surgery.  Men may shave face and neck.  Do not bring valuables to the hospital.  [facility] is not responsible for any belongings or valuables.  Contacts, dentures or bridgework may not be worn into surgery.  Leave your suitcase in the car.  After surgery it may be brought to your room.  For patients admitted to the hospital, discharge time will be determined by your treatment team.  Patients discharged the day of surgery will not be allowed to drive home.   Special instructions:    [facility]- Preparing For Surgery  Before surgery, you can play an important role. Because skin is not sterile, your skin needs to be as free of germs as possible. You can reduce the number of germs on your skin by washing with CHG (chlorahexidine gluconate) Soap before surgery.  CHG is an antiseptic cleaner which kills germs and bonds with the skin to continue killing germs  even after washing.    Oral Hygiene is also important to reduce your risk of infection.  Remember - BRUSH YOUR TEETH THE MORNING OF SURGERY WITH YOUR REGULAR TOOTHPASTE  Please do not use if you have an allergy to CHG or antibacterial soaps. If your skin becomes reddened/irritated stop using the CHG.  Do not shave (including legs and underarms) for at least 48 hours prior to first CHG shower. It is OK to shave your face.  Please follow these instructions carefully.   1. Shower the NIGHT BEFORE SURGERY and the MORNING OF SURGERY with CHG.   2. If you chose to wash your hair, wash your hair first as usual with your normal shampoo.  3. After you shampoo, rinse your hair and body thoroughly to remove the shampoo.  4. Use CHG as you would any other liquid soap. You can apply CHG directly to the skin and wash gently with a scrungie or a clean washcloth.   5. Apply the CHG Soap to your body ONLY FROM THE NECK DOWN.  Do not use on open wounds or open sores. Avoid contact with your eyes, ears, mouth and genitals (private parts). Wash Face and genitals (private parts)  with your normal soap.  6. Wash thoroughly, paying special attention to the area where your surgery will be performed.  7. Thoroughly rinse your body with warm water from the neck down.  8. DO NOT shower/wash with your normal soap after using and rinsing off  the CHG Soap.  9. Pat yourself dry with a CLEAN TOWEL.  10. Wear CLEAN PAJAMAS to bed the night before surgery, wear comfortable clothes the morning of surgery  11. Place CLEAN SHEETS on your bed the night of your first shower and DO NOT SLEEP WITH PETS.    Day of Surgery:  Do not apply any deodorants/lotions.  Please wear clean clothes to the hospital/surgery center.   Remember to brush your teeth WITH YOUR REGULAR TOOTHPASTE.    Please read over the following fact sheets that you were given. Coughing and Deep Breathing, MRSA Information and Surgical Site Infection  Prevention

## 2018-08-23 ENCOUNTER — Encounter (HOSPITAL_COMMUNITY)
Admission: RE | Admit: 2018-08-23 | Discharge: 2018-08-23 | Disposition: A | Discharge status: Home / Self Care | Payer: Managed Care, Other (non HMO) | Source: Ambulatory Visit | Attending: Orthopedic Surgery | Admitting: Orthopedic Surgery

## 2018-08-23 ENCOUNTER — Other Ambulatory Visit: Payer: Self-pay

## 2018-08-23 ENCOUNTER — Encounter (HOSPITAL_COMMUNITY): Payer: Self-pay

## 2018-08-23 DIAGNOSIS — Z01810 Encounter for preprocedural cardiovascular examination: Secondary | ICD-10-CM | POA: Diagnosis present

## 2018-08-23 DIAGNOSIS — Z01812 Encounter for preprocedural laboratory examination: Secondary | ICD-10-CM | POA: Diagnosis present

## 2018-08-23 HISTORY — DX: Nausea with vomiting, unspecified: R11.2

## 2018-08-23 HISTORY — DX: Other specified postprocedural states: Z98.890

## 2018-08-23 LAB — CBC
HEMATOCRIT: 38.8 % (ref 36.0–46.0)
HEMOGLOBIN: 12.9 g/dL (ref 12.0–15.0)
MCH: 31.2 pg (ref 26.0–34.0)
MCHC: 33.2 g/dL (ref 30.0–36.0)
MCV: 93.7 fL (ref 78.0–100.0)
Platelets: 166 10*3/uL (ref 150–400)
RBC: 4.14 MIL/uL (ref 3.87–5.11)
RDW: 12.3 % (ref 11.5–15.5)
WBC: 5.4 10*3/uL (ref 4.0–10.5)

## 2018-08-23 LAB — BASIC METABOLIC PANEL
Anion gap: 11 (ref 5–15)
BUN: 33 mg/dL — AB (ref 8–23)
CALCIUM: 10.2 mg/dL (ref 8.9–10.3)
CHLORIDE: 99 mmol/L (ref 98–111)
CO2: 28 mmol/L (ref 22–32)
CREATININE: 1.17 mg/dL — AB (ref 0.44–1.00)
GFR calc Af Amer: 53 mL/min — ABNORMAL LOW (ref >60)
GFR calc non Af Amer: 46 mL/min — ABNORMAL LOW (ref >60)
Glucose, Bld: 99 mg/dL (ref 70–99)
Potassium: 3.4 mmol/L — ABNORMAL LOW (ref 3.5–5.1)
Sodium: 138 mmol/L (ref 135–145)

## 2018-08-23 LAB — SURGICAL PCR SCREEN
MRSA, PCR: NEGATIVE
Staphylococcus aureus: NEGATIVE

## 2018-08-23 NOTE — Progress Notes (Signed)
PCP - Reynold Bowen MD  EKG -  08/23/18 Stress Test - > 10 years    Anesthesia review: none  Patient denies shortness of breath, fever, cough and chest pain at PAT appointment   Patient verbalized understanding of instructions that were given to them at the PAT appointment. Patient was also instructed that they will need to review over the PAT instructions again at home before surgery.

## 2018-08-31 MED ORDER — SODIUM CHLORIDE 0.9 % IV SOLN
1000.0000 mg | INTRAVENOUS | Status: AC
  Administered 2018-09-01: 1000 mg via INTRAVENOUS
  Filled 2018-08-31: qty 1100

## 2018-09-01 ENCOUNTER — Observation Stay (HOSPITAL_COMMUNITY)
Admission: RE | Admit: 2018-09-01 | Discharge: 2018-09-02 | Disposition: A | Discharge status: Home / Self Care | Payer: Managed Care, Other (non HMO) | Source: Ambulatory Visit | Attending: Orthopedic Surgery | Admitting: Orthopedic Surgery

## 2018-09-01 ENCOUNTER — Ambulatory Visit (HOSPITAL_COMMUNITY): Payer: Managed Care, Other (non HMO) | Admitting: Anesthesiology

## 2018-09-01 ENCOUNTER — Encounter (HOSPITAL_COMMUNITY): Payer: Self-pay | Admitting: Certified Registered"

## 2018-09-01 ENCOUNTER — Other Ambulatory Visit: Payer: Self-pay

## 2018-09-01 ENCOUNTER — Encounter (HOSPITAL_COMMUNITY)
Admission: RE | Disposition: A | Discharge status: Home / Self Care | Payer: Self-pay | Source: Ambulatory Visit | Attending: Orthopedic Surgery

## 2018-09-01 DIAGNOSIS — M13811 Other specified arthritis, right shoulder: Principal | ICD-10-CM | POA: Insufficient documentation

## 2018-09-01 DIAGNOSIS — Z86010 Personal history of colonic polyps: Secondary | ICD-10-CM | POA: Diagnosis not present

## 2018-09-01 DIAGNOSIS — Z79890 Hormone replacement therapy (postmenopausal): Secondary | ICD-10-CM | POA: Diagnosis not present

## 2018-09-01 DIAGNOSIS — J45909 Unspecified asthma, uncomplicated: Secondary | ICD-10-CM | POA: Diagnosis not present

## 2018-09-01 DIAGNOSIS — E785 Hyperlipidemia, unspecified: Secondary | ICD-10-CM | POA: Insufficient documentation

## 2018-09-01 DIAGNOSIS — E0500 Thyrotoxicosis with diffuse goiter without thyrotoxic crisis or storm: Secondary | ICD-10-CM | POA: Insufficient documentation

## 2018-09-01 DIAGNOSIS — Z79899 Other long term (current) drug therapy: Secondary | ICD-10-CM | POA: Diagnosis not present

## 2018-09-01 DIAGNOSIS — K219 Gastro-esophageal reflux disease without esophagitis: Secondary | ICD-10-CM | POA: Diagnosis not present

## 2018-09-01 DIAGNOSIS — M109 Gout, unspecified: Secondary | ICD-10-CM | POA: Insufficient documentation

## 2018-09-01 DIAGNOSIS — Z8371 Family history of colonic polyps: Secondary | ICD-10-CM | POA: Diagnosis not present

## 2018-09-01 DIAGNOSIS — I10 Essential (primary) hypertension: Secondary | ICD-10-CM | POA: Diagnosis not present

## 2018-09-01 DIAGNOSIS — Z96611 Presence of right artificial shoulder joint: Secondary | ICD-10-CM

## 2018-09-01 DIAGNOSIS — Z79891 Long term (current) use of opiate analgesic: Secondary | ICD-10-CM | POA: Diagnosis not present

## 2018-09-01 HISTORY — DX: Hypothyroidism, unspecified: E03.9

## 2018-09-01 HISTORY — DX: Personal history of other medical treatment: Z92.89

## 2018-09-01 HISTORY — DX: Personal history of other diseases of the musculoskeletal system and connective tissue: Z87.39

## 2018-09-01 HISTORY — DX: Other seasonal allergic rhinitis: J30.2

## 2018-09-01 HISTORY — PX: REVERSE SHOULDER ARTHROPLASTY: SHX5054

## 2018-09-01 HISTORY — DX: Pneumonia, unspecified organism: J18.9

## 2018-09-01 HISTORY — DX: Migraine, unspecified, not intractable, without status migrainosus: G43.909

## 2018-09-01 SURGERY — ARTHROPLASTY, SHOULDER, TOTAL, REVERSE
Anesthesia: Regional | Site: Shoulder | Laterality: Right

## 2018-09-01 MED ORDER — ZOLPIDEM TARTRATE 5 MG PO TABS
5.0000 mg | ORAL_TABLET | Freq: Once | ORAL | Status: AC
  Administered 2018-09-01: 5 mg via ORAL
  Filled 2018-09-01: qty 1

## 2018-09-01 MED ORDER — HYDROMORPHONE HCL 1 MG/ML IJ SOLN: 0.5000 mg | INTRAMUSCULAR | Status: DC | PRN

## 2018-09-01 MED ORDER — HYDROCHLOROTHIAZIDE 25 MG PO TABS
25.0000 mg | ORAL_TABLET | Freq: Every day | ORAL | Status: DC
  Administered 2018-09-01 – 2018-09-02 (×2): 25 mg via ORAL
  Filled 2018-09-01 (×3): qty 1

## 2018-09-01 MED ORDER — DOCUSATE SODIUM 100 MG PO CAPS
100.0000 mg | ORAL_CAPSULE | Freq: Two times a day (BID) | ORAL | Status: DC
  Administered 2018-09-01 – 2018-09-02 (×3): 100 mg via ORAL
  Filled 2018-09-01 (×3): qty 1

## 2018-09-01 MED ORDER — FENOFIBRATE 54 MG PO TABS
54.0000 mg | ORAL_TABLET | Freq: Every day | ORAL | Status: DC
  Administered 2018-09-02: 54 mg via ORAL
  Filled 2018-09-01 (×2): qty 1

## 2018-09-01 MED ORDER — LIDOCAINE 2% (20 MG/ML) 5 ML SYRINGE
INTRAMUSCULAR | Status: AC
  Filled 2018-09-01: qty 5

## 2018-09-01 MED ORDER — METHOCARBAMOL 1000 MG/10ML IJ SOLN
500.0000 mg | Freq: Four times a day (QID) | INTRAVENOUS | Status: DC | PRN
  Filled 2018-09-01: qty 5

## 2018-09-01 MED ORDER — LEVOTHYROXINE SODIUM 112 MCG PO TABS
112.0000 ug | ORAL_TABLET | Freq: Every day | ORAL | Status: DC
  Administered 2018-09-02: 112 ug via ORAL
  Filled 2018-09-01: qty 1

## 2018-09-01 MED ORDER — MAGNESIUM CITRATE PO SOLN: 1.0000 | Freq: Once | ORAL | Status: DC | PRN

## 2018-09-01 MED ORDER — MENTHOL 3 MG MT LOZG: 1.0000 | LOZENGE | OROMUCOSAL | Status: DC | PRN

## 2018-09-01 MED ORDER — COLCHICINE 0.6 MG PO TABS
0.6000 mg | ORAL_TABLET | Freq: Every day | ORAL | Status: DC
  Administered 2018-09-01 – 2018-09-02 (×2): 0.6 mg via ORAL
  Filled 2018-09-01 (×2): qty 1

## 2018-09-01 MED ORDER — CHLORHEXIDINE GLUCONATE 4 % EX LIQD: 60.0000 mL | Freq: Once | CUTANEOUS | Status: DC

## 2018-09-01 MED ORDER — ALBUTEROL SULFATE (2.5 MG/3ML) 0.083% IN NEBU: 2.5000 mg | INHALATION_SOLUTION | Freq: Four times a day (QID) | RESPIRATORY_TRACT | Status: DC | PRN

## 2018-09-01 MED ORDER — ATORVASTATIN CALCIUM 40 MG PO TABS
40.0000 mg | ORAL_TABLET | Freq: Every day | ORAL | Status: DC
  Administered 2018-09-01: 40 mg via ORAL
  Filled 2018-09-01: qty 1

## 2018-09-01 MED ORDER — AMPICILLIN 500 MG PO CAPS
500.0000 mg | ORAL_CAPSULE | Freq: Every day | ORAL | Status: DC
  Administered 2018-09-02: 500 mg via ORAL
  Filled 2018-09-01: qty 1

## 2018-09-01 MED ORDER — SUGAMMADEX SODIUM 200 MG/2ML IV SOLN
INTRAVENOUS | Status: DC | PRN
  Administered 2018-09-01: 150 mg via INTRAVENOUS

## 2018-09-01 MED ORDER — EPHEDRINE SULFATE-NACL 50-0.9 MG/10ML-% IV SOSY
PREFILLED_SYRINGE | INTRAVENOUS | Status: DC | PRN
  Administered 2018-09-01 (×2): 5 mg via INTRAVENOUS

## 2018-09-01 MED ORDER — LIDOCAINE 2% (20 MG/ML) 5 ML SYRINGE
INTRAMUSCULAR | Status: DC | PRN
  Administered 2018-09-01: 40 mg via INTRAVENOUS

## 2018-09-01 MED ORDER — ALLOPURINOL 100 MG PO TABS
100.0000 mg | ORAL_TABLET | Freq: Two times a day (BID) | ORAL | Status: DC
  Administered 2018-09-01 – 2018-09-02 (×2): 100 mg via ORAL
  Filled 2018-09-01 (×2): qty 1

## 2018-09-01 MED ORDER — FENTANYL CITRATE (PF) 100 MCG/2ML IJ SOLN
INTRAMUSCULAR | Status: DC | PRN
  Administered 2018-09-01 (×2): 50 ug via INTRAVENOUS

## 2018-09-01 MED ORDER — ONDANSETRON HCL 4 MG/2ML IJ SOLN
INTRAMUSCULAR | Status: DC | PRN
  Administered 2018-09-01 (×2): 4 mg via INTRAVENOUS

## 2018-09-01 MED ORDER — SODIUM CHLORIDE 0.9 % IV SOLN
INTRAVENOUS | Status: DC | PRN
  Administered 2018-09-01: 40 ug/min via INTRAVENOUS

## 2018-09-01 MED ORDER — MIDAZOLAM HCL 2 MG/2ML IJ SOLN
INTRAMUSCULAR | Status: AC
  Filled 2018-09-01: qty 2

## 2018-09-01 MED ORDER — LACTATED RINGERS IV SOLN
INTRAVENOUS | Status: DC
  Administered 2018-09-01: 17:00:00 via INTRAVENOUS

## 2018-09-01 MED ORDER — DOXYCYCLINE HYCLATE 50 MG PO CAPS
50.0000 mg | ORAL_CAPSULE | Freq: Two times a day (BID) | ORAL | Status: DC
  Administered 2018-09-01 – 2018-09-02 (×2): 50 mg via ORAL
  Filled 2018-09-01 (×2): qty 1

## 2018-09-01 MED ORDER — LACTATED RINGERS IV SOLN
INTRAVENOUS | Status: DC | PRN
  Administered 2018-09-01 (×2): via INTRAVENOUS

## 2018-09-01 MED ORDER — EPHEDRINE 5 MG/ML INJ
INTRAVENOUS | Status: AC
  Filled 2018-09-01: qty 10

## 2018-09-01 MED ORDER — PHENOL 1.4 % MT LIQD: 1.0000 | OROMUCOSAL | Status: DC | PRN

## 2018-09-01 MED ORDER — DIPHENHYDRAMINE HCL 12.5 MG/5ML PO ELIX: 12.5000 mg | ORAL_SOLUTION | ORAL | Status: DC | PRN

## 2018-09-01 MED ORDER — OXYCODONE HCL 5 MG/5ML PO SOLN: 5.0000 mg | Freq: Once | ORAL | Status: DC | PRN

## 2018-09-01 MED ORDER — DEXAMETHASONE SODIUM PHOSPHATE 10 MG/ML IJ SOLN
INTRAMUSCULAR | Status: DC | PRN
  Administered 2018-09-01: 10 mg via INTRAVENOUS

## 2018-09-01 MED ORDER — ONDANSETRON HCL 4 MG/2ML IJ SOLN
INTRAMUSCULAR | Status: AC
  Filled 2018-09-01: qty 2

## 2018-09-01 MED ORDER — IVERMECTIN 1 % EX CREA: 1.0000 "application " | TOPICAL_CREAM | Freq: Every morning | CUTANEOUS | Status: DC

## 2018-09-01 MED ORDER — DEXAMETHASONE SODIUM PHOSPHATE 10 MG/ML IJ SOLN
INTRAMUSCULAR | Status: AC
  Filled 2018-09-01: qty 1

## 2018-09-01 MED ORDER — MIDAZOLAM HCL 5 MG/5ML IJ SOLN
INTRAMUSCULAR | Status: DC | PRN
  Administered 2018-09-01: 1 mg via INTRAVENOUS

## 2018-09-01 MED ORDER — KETOROLAC TROMETHAMINE 15 MG/ML IJ SOLN
7.5000 mg | Freq: Four times a day (QID) | INTRAMUSCULAR | Status: AC
  Administered 2018-09-01 – 2018-09-02 (×4): 7.5 mg via INTRAVENOUS
  Filled 2018-09-01 (×4): qty 1

## 2018-09-01 MED ORDER — METOCLOPRAMIDE HCL 5 MG PO TABS: 5.0000 mg | ORAL_TABLET | Freq: Three times a day (TID) | ORAL | Status: DC | PRN

## 2018-09-01 MED ORDER — METHOCARBAMOL 500 MG PO TABS
500.0000 mg | ORAL_TABLET | Freq: Four times a day (QID) | ORAL | Status: DC | PRN
  Administered 2018-09-01: 500 mg via ORAL
  Filled 2018-09-01: qty 1

## 2018-09-01 MED ORDER — PROPOFOL 10 MG/ML IV BOLUS
INTRAVENOUS | Status: AC
  Filled 2018-09-01: qty 20

## 2018-09-01 MED ORDER — POLYETHYLENE GLYCOL 3350 17 G PO PACK: 17.0000 g | PACK | Freq: Every day | ORAL | Status: DC | PRN

## 2018-09-01 MED ORDER — 0.9 % SODIUM CHLORIDE (POUR BTL) OPTIME
TOPICAL | Status: DC | PRN
  Administered 2018-09-01: 1000 mL

## 2018-09-01 MED ORDER — ROCURONIUM BROMIDE 50 MG/5ML IV SOSY
PREFILLED_SYRINGE | INTRAVENOUS | Status: AC
  Filled 2018-09-01: qty 5

## 2018-09-01 MED ORDER — METOCLOPRAMIDE HCL 5 MG/ML IJ SOLN: 5.0000 mg | Freq: Three times a day (TID) | INTRAMUSCULAR | Status: DC | PRN

## 2018-09-01 MED ORDER — MONTELUKAST SODIUM 10 MG PO TABS
10.0000 mg | ORAL_TABLET | Freq: Every day | ORAL | Status: DC
  Administered 2018-09-01: 10 mg via ORAL
  Filled 2018-09-01: qty 1

## 2018-09-01 MED ORDER — ROCURONIUM BROMIDE 100 MG/10ML IV SOLN
INTRAVENOUS | Status: DC | PRN
  Administered 2018-09-01: 50 mg via INTRAVENOUS

## 2018-09-01 MED ORDER — CEFAZOLIN SODIUM-DEXTROSE 2-4 GM/100ML-% IV SOLN
2.0000 g | INTRAVENOUS | Status: AC
  Administered 2018-09-01: 2 g via INTRAVENOUS
  Filled 2018-09-01: qty 100

## 2018-09-01 MED ORDER — OXYCODONE HCL 5 MG PO TABS
5.0000 mg | ORAL_TABLET | ORAL | Status: DC | PRN
  Administered 2018-09-01: 5 mg via ORAL
  Filled 2018-09-01: qty 1

## 2018-09-01 MED ORDER — ALUM & MAG HYDROXIDE-SIMETH 200-200-20 MG/5ML PO SUSP: 30.0000 mL | ORAL | Status: DC | PRN

## 2018-09-01 MED ORDER — ONDANSETRON HCL 4 MG/2ML IJ SOLN: 4.0000 mg | Freq: Four times a day (QID) | INTRAMUSCULAR | Status: DC | PRN

## 2018-09-01 MED ORDER — ONDANSETRON HCL 4 MG PO TABS: 4.0000 mg | ORAL_TABLET | Freq: Four times a day (QID) | ORAL | Status: DC | PRN

## 2018-09-01 MED ORDER — PROPOFOL 10 MG/ML IV BOLUS
INTRAVENOUS | Status: DC | PRN
  Administered 2018-09-01: 20 mg via INTRAVENOUS
  Administered 2018-09-01: 100 mg via INTRAVENOUS

## 2018-09-01 MED ORDER — FENTANYL CITRATE (PF) 100 MCG/2ML IJ SOLN: 25.0000 ug | INTRAMUSCULAR | Status: DC | PRN

## 2018-09-01 MED ORDER — OXYCODONE HCL 5 MG PO TABS: 5.0000 mg | ORAL_TABLET | Freq: Once | ORAL | Status: DC | PRN

## 2018-09-01 MED ORDER — BISACODYL 5 MG PO TBEC: 5.0000 mg | DELAYED_RELEASE_TABLET | Freq: Every day | ORAL | Status: DC | PRN

## 2018-09-01 MED ORDER — FENTANYL CITRATE (PF) 250 MCG/5ML IJ SOLN
INTRAMUSCULAR | Status: AC
  Filled 2018-09-01: qty 5

## 2018-09-01 MED ORDER — ACETAMINOPHEN 325 MG PO TABS: 325.0000 mg | ORAL_TABLET | Freq: Four times a day (QID) | ORAL | Status: DC | PRN

## 2018-09-01 MED ORDER — BUPIVACAINE HCL (PF) 0.5 % IJ SOLN
INTRAMUSCULAR | Status: DC | PRN
  Administered 2018-09-01: 15 mL via PERINEURAL

## 2018-09-01 MED ORDER — OXYCODONE HCL 5 MG PO TABS: 5.0000 mg | ORAL_TABLET | ORAL | Status: DC | PRN

## 2018-09-01 MED ORDER — BUPIVACAINE LIPOSOME 1.3 % IJ SUSP
INTRAMUSCULAR | Status: DC | PRN
  Administered 2018-09-01: 10 mL via PERINEURAL

## 2018-09-01 SURGICAL SUPPLY — 73 items
ADH SKN CLS APL DERMABOND .7 (GAUZE/BANDAGES/DRESSINGS) ×1
ADH SKN CLS LQ APL DERMABOND (GAUZE/BANDAGES/DRESSINGS) ×1
AID PSTN UNV HD RSTRNT DISP (MISCELLANEOUS) ×1
BASEPLATE GLENOID SHLDR SM (Shoulder) ×2 IMPLANT
BLADE SAW SGTL 83.5X18.5 (BLADE) ×3 IMPLANT
BSPLAT GLND SM PRFT SHLDR CA (Shoulder) ×1 IMPLANT
COVER SURGICAL LIGHT HANDLE (MISCELLANEOUS) ×3 IMPLANT
CUP SUT UNIV REVERS 36+2 RT (Cup) ×2 IMPLANT
DERMABOND ADHESIVE PROPEN (GAUZE/BANDAGES/DRESSINGS) ×2
DERMABOND ADVANCED (GAUZE/BANDAGES/DRESSINGS) ×2
DERMABOND ADVANCED .7 DNX12 (GAUZE/BANDAGES/DRESSINGS) ×1 IMPLANT
DERMABOND ADVANCED .7 DNX6 (GAUZE/BANDAGES/DRESSINGS) IMPLANT
DRAPE ORTHO SPLIT 77X108 STRL (DRAPES) ×6
DRAPE SURG 17X11 SM STRL (DRAPES) ×3 IMPLANT
DRAPE SURG ORHT 6 SPLT 77X108 (DRAPES) ×2 IMPLANT
DRAPE U-SHAPE 47X51 STRL (DRAPES) ×3 IMPLANT
DRSG AQUACEL AG ADV 3.5X10 (GAUZE/BANDAGES/DRESSINGS) ×3 IMPLANT
DURAPREP 26ML APPLICATOR (WOUND CARE) ×3 IMPLANT
ELECT BLADE 4.0 EZ CLEAN MEGAD (MISCELLANEOUS) ×3
ELECT CAUTERY BLADE 6.4 (BLADE) ×3 IMPLANT
ELECT REM PT RETURN 9FT ADLT (ELECTROSURGICAL) ×3
ELECTRODE BLDE 4.0 EZ CLN MEGD (MISCELLANEOUS) ×1 IMPLANT
ELECTRODE REM PT RTRN 9FT ADLT (ELECTROSURGICAL) ×1 IMPLANT
FACESHIELD WRAPAROUND (MASK) ×12 IMPLANT
FACESHIELD WRAPAROUND OR TEAM (MASK) ×3 IMPLANT
GLENOSPHERE LATERAL 36MM+4 (Shoulder) ×2 IMPLANT
GLOVE BIO SURGEON STRL SZ7.5 (GLOVE) ×3 IMPLANT
GLOVE BIO SURGEON STRL SZ8 (GLOVE) ×3 IMPLANT
GLOVE BIOGEL PI ORTHO PRO 7.5 (GLOVE) ×2
GLOVE EUDERMIC 7 POWDERFREE (GLOVE) ×3 IMPLANT
GLOVE INDICATOR 7.5 STRL GRN (GLOVE) ×2 IMPLANT
GLOVE PI ORTHO PRO STRL 7.5 (GLOVE) IMPLANT
GLOVE SS BIOGEL STRL SZ 7.5 (GLOVE) ×1 IMPLANT
GLOVE SUPERSENSE BIOGEL SZ 7.5 (GLOVE) ×2
GLOVE SURG SS PI 6.0 STRL IVOR (GLOVE) ×2 IMPLANT
GLOVE SURG SS PI 7.0 STRL IVOR (GLOVE) ×2 IMPLANT
GLOVE SURG SS PI 8.0 STRL IVOR (GLOVE) ×2 IMPLANT
GLOVE SURG SYN 7.5  E (GLOVE) ×2
GLOVE SURG SYN 7.5 E (GLOVE) ×1 IMPLANT
GLOVE SURG SYN 7.5 PF PI (GLOVE) IMPLANT
GLOVE SURG SYN 8.0 (GLOVE) ×3 IMPLANT
GLOVE SURG SYN 8.0 PF PI (GLOVE) IMPLANT
GOWN STRL REUS W/ TWL LRG LVL3 (GOWN DISPOSABLE) ×1 IMPLANT
GOWN STRL REUS W/ TWL XL LVL3 (GOWN DISPOSABLE) ×2 IMPLANT
GOWN STRL REUS W/TWL LRG LVL3 (GOWN DISPOSABLE) ×6
GOWN STRL REUS W/TWL XL LVL3 (GOWN DISPOSABLE) ×6
INSERT HUMERAL 36 +6 (Shoulder) ×2 IMPLANT
KIT BASIN OR (CUSTOM PROCEDURE TRAY) ×3 IMPLANT
KIT TURNOVER KIT B (KITS) ×3 IMPLANT
MANIFOLD NEPTUNE II (INSTRUMENTS) ×3 IMPLANT
NDL TAPERED W/ NITINOL LOOP (MISCELLANEOUS) ×1 IMPLANT
NEEDLE TAPERED W/ NITINOL LOOP (MISCELLANEOUS) ×3 IMPLANT
NS IRRIG 1000ML POUR BTL (IV SOLUTION) ×3 IMPLANT
PACK SHOULDER (CUSTOM PROCEDURE TRAY) ×3 IMPLANT
PAD ARMBOARD 7.5X6 YLW CONV (MISCELLANEOUS) ×6 IMPLANT
RESTRAINT HEAD UNIVERSAL NS (MISCELLANEOUS) ×3 IMPLANT
SCREW CENTRAL NONLOCK 25MM (Screw) ×2 IMPLANT
SCREW LOCK PERIPHERAL 30MM (Shoulder) ×2 IMPLANT
SCREW LOCK PERIPHERAL 36MM (Screw) ×2 IMPLANT
SET PIN UNIVERSAL REVERSE (SET/KITS/TRAYS/PACK) ×2 IMPLANT
SLING ARM FOAM STRAP LRG (SOFTGOODS) ×2 IMPLANT
SLING ARM FOAM STRAP MED (SOFTGOODS) ×2 IMPLANT
SPONGE LAP 18X18 X RAY DECT (DISPOSABLE) ×3 IMPLANT
SPONGE LAP 4X18 RFD (DISPOSABLE) ×3 IMPLANT
STEM HUMERAL MOD SZ 5 135 DEG (Stem) ×2 IMPLANT
SUT MNCRL AB 3-0 PS2 18 (SUTURE) ×3 IMPLANT
SUT MON AB 2-0 CT1 27 (SUTURE) ×3 IMPLANT
SUT VIC AB 1 CT1 27 (SUTURE) ×3
SUT VIC AB 1 CT1 27XBRD ANBCTR (SUTURE) ×1 IMPLANT
SUTURE TAPE 1.3 40 TPR END (SUTURE) ×1 IMPLANT
SUTURETAPE 1.3 40 TPR END (SUTURE) ×6
TOWEL OR 17X26 10 PK STRL BLUE (TOWEL DISPOSABLE) ×3 IMPLANT
WATER STERILE IRR 1000ML POUR (IV SOLUTION) ×3 IMPLANT

## 2018-09-01 NOTE — H&P (Signed)
[name]    Chief Complaint: right shoulder rotator cuff tear arthropathy HPI: The patient is a 72 y.o. 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 Allergy    SEASONAL  . Anemia   . Arthritis    FINGERS  . Asthma   . Blood transfusion without reported diagnosis   . Celiac disease   . GERD (gastroesophageal reflux disease)   . Gout    fingers  . Graves disease   . Heart murmur   . HLD (hyperlipidemia)   . HTN (hypertension)   . PONV (postoperative nausea and vomiting)     Past Surgical History:  Procedure Laterality Date  . CESAREAN SECTION    . COLONOSCOPY    . POLYPECTOMY    . ROTATOR CUFF REPAIR Bilateral   . THYROIDECTOMY    . TONSILLECTOMY      Family History  Problem Relation Age of Onset  . Multiple myeloma Father   . Bone cancer Father   . Celiac disease Sister   . Colon polyps Sister   . Thyroid disease Mother   . Gallstones Mother   . Heart attack Maternal Grandfather   . Colon polyps Brother   . Diabetes Maternal Grandmother     Social History:  reports that she has never smoked. She has never used smokeless tobacco. She reports that she does not drink alcohol or use drugs.   Medications Prior to Admission  Medication Sig Dispense Refill  . allopurinol (ZYLOPRIM) 100 MG tablet Take 100 mg by mouth 2 (two) times daily.  5  . ampicillin (PRINCIPEN) 500 MG capsule Take 500 mg by mouth daily.   5  . atorvastatin (LIPITOR) 40 MG tablet Take 40 mg by mouth daily.    . Colchicine 0.6 MG CAPS Take 0.6 mg by mouth daily.  3  . doxycycline (VIBRAMYCIN) 50 MG capsule Take 50 mg by mouth 2 (two) times daily.  0  . fenofibrate micronized (LOFIBRA) 67 MG capsule Take 67 mg by mouth daily.  5  . hydrochlorothiazide (HYDRODIURIL) 25 MG tablet Take 25 mg by mouth daily.     . Ivermectin (SOOLANTRA) 1 % CREA Apply 1 application topically every morning.    Marland Kitchen levothyroxine (SYNTHROID,  LEVOTHROID) 112 MCG tablet Take 124 mcg by mouth daily before breakfast.     . magnesium oxide (MAG-OX) 400 MG tablet Take 400 mg by mouth 2 (two) times daily.    . montelukast (SINGULAIR) 10 MG tablet Take 10 mg by mouth at bedtime.    Marland Kitchen olmesartan (BENICAR) 40 MG tablet Take 40 mg by mouth daily.    . Omega-3 Fatty Acids (FISH OIL) 1000 MG CAPS Take 1,000 mg by mouth 2 (two) times daily.    . Omeprazole 20 MG TBDD Take 20 mg by mouth 2 (two) times daily.     Marland Kitchen albuterol (PROAIR HFA) 108 (90 Base) MCG/ACT inhaler Inhale 2 puffs into the lungs every 6 (six) hours as needed for wheezing or shortness of breath.       Physical Exam: right shoulder with painful and restricted motion as noted at recent office visits  Vitals  Temp:  [97.8 F (36.6 C)] 97.8 F (36.6 C) (09/05 0548) Pulse Rate:  [85] 85 (09/05 0548) Resp:  [18] 18 (09/05 0548) BP: (117)/(69) 117/69 (09/05 0548) SpO2:  [100 %] 100 % (09/05 0548)  Assessment/Plan  Impression: right shoulder rotator cuff tear arthropathy  Plan of Action: Procedure(s): RIGHT REVERSE SHOULDER ARTHROPLASTY  Addaleigh Nicholls M Attie Nawabi 09/01/2018, 6:45 AM Contact # [phone]

## 2018-09-01 NOTE — Anesthesia Preprocedure Evaluation (Signed)
Anesthesia Evaluation  Patient identified by MRN, date of birth, ID band Patient awake    Reviewed: Allergy & Precautions, H&P , NPO status , Patient's Chart, lab work & pertinent test results  History of Anesthesia Complications (+) PONV and history of anesthetic complications  Airway Mallampati: II   Neck ROM: full    Dental   Pulmonary asthma ,    breath sounds clear to auscultation       Cardiovascular hypertension,  Rhythm:regular Rate:Normal     Neuro/Psych    GI/Hepatic GERD  ,  Endo/Other  Hypothyroidism Hyperthyroidism   Renal/GU      Musculoskeletal  (+) Arthritis ,   Abdominal   Peds  Hematology  (+) Blood dyscrasia, anemia ,   Anesthesia Other Findings   Reproductive/Obstetrics                             Anesthesia Physical Anesthesia Plan  ASA: II  Anesthesia Plan: General and Regional   Post-op Pain Management:  Regional for Post-op pain   Induction: Intravenous  PONV Risk Score and Plan: 4 or greater and Ondansetron, Dexamethasone, Scopolamine patch - Pre-op and Treatment may vary due to age or medical condition  Airway Management Planned: Oral ETT  Additional Equipment:   Intra-op Plan:   Post-operative Plan: Extubation in OR  Informed Consent: I have reviewed the patients History and Physical, chart, labs and discussed the procedure including the risks, benefits and alternatives for the proposed anesthesia with the patient or authorized representative who has indicated his/her understanding and acceptance.     Plan Discussed with: CRNA, Surgeon and Anesthesiologist  Anesthesia Plan Comments:         Anesthesia Quick Evaluation

## 2018-09-01 NOTE — Discharge Instructions (Signed)
° °Kevin M. Supple, M.D., F.A.A.O.S. °Orthopaedic Surgery °Specializing in Arthroscopic and Reconstructive °Surgery of the Shoulder and Knee °[phone] °[address] - [place], [place] [address] - Fax [phone] ° ° °POST-OP TOTAL SHOULDER REPLACEMENT INSTRUCTIONS ° °1. Call the office at [phone] to schedule your first post-op appointment 10-14 days from the date of your surgery. ° °2. The bandage over your incision is waterproof. You may begin showering with this dressing on. You may leave this dressing on until first follow up appointment within 2 weeks. We prefer you leave this dressing in place until follow up however after 5-7 days if you are having itching or skin irritation and would like to remove it you may do so. Go slow and tug at the borders gently to break the bond the dressing has with the skin. At this point if there is no drainage it is okay to go without a bandage or you may cover it with a light guaze and tape. You can also expect significant bruising around your shoulder that will drift down your arm and into your chest wall. This is very normal and should resolve over several days. ° ° 3. Wear your sling/immobilizer at all times except to perform the exercises below or to occasionally let your arm dangle by your side to stretch your elbow. You also need to sleep in your sling immobilizer until instructed otherwise. ° °4. Range of motion to your elbow, wrist, and hand are encouraged 3-5 times daily. Exercise to your hand and fingers helps to reduce swelling you may experience. ° °5. Utilize ice to the shoulder 3-5 times minimum a day and additionally if you are experiencing pain. ° °6. Prescriptions for a pain medication and a muscle relaxant are provided for you. It is recommended that if you are experiencing pain that you pain medication alone is not controlling, add the muscle relaxant along with the pain medication which can give additional pain relief. The first 1-2 days  is generally the most severe of your pain and then should gradually decrease. As your pain lessens it is recommended that you decrease your use of the pain medications to an "as needed basis'" only and to always comply with the recommended dosages of the pain medications. ° °7. Pain medications can produce constipation along with their use. If you experience this, the use of an over the counter stool softener or laxative daily is recommended.  ° °8. For additional questions or concerns, please do not hesitate to call the office. If after hours there is an answering service to forward your concerns to the physician on call. ° °9.Pain control following an exparel block ° °To help control your post-operative pain you received a nerve block  performed with Exparel which is a long acting anesthetic (numbing agent) which can provide pain relief and sensations of numbness (and relief of pain) in the operative shoulder and arm for up to 3 days. Sometimes it provides mixed relief, meaning you may still have numbness in certain areas of the arm but can still be able to move  parts of that arm, hand, and fingers. We recommend that your prescribed pain medications  be used as needed. We do not feel it is necessary to "pre medicate" and "stay ahead" of pain.  Taking narcotic pain medications when you are not having any pain can lead to unnecessary and potentially dangerous side effects.  ° °POST-OP EXERCISES ° °Pendulum Exercises ° °Perform pendulum exercises while standing and bending at   the waist. Support your uninvolved arm on a table or chair and allow your operated arm to hang freely. Make sure to do these exercises passively - not using you shoulder muscles. ° °Repeat 20 times. Do 3 sessions per day. ° ° ° ° °

## 2018-09-01 NOTE — Op Note (Signed)
09/01/2018  9:29 AM  PATIENT:   Felicia Washington  72 y.o. female  PRE-OPERATIVE DIAGNOSIS:  right shoulder rotator cuff tear arthropathy  POST-OPERATIVE DIAGNOSIS:  same  PROCEDURE:  R RSA #5.5 stem, +6 poly, 36/+4 glenosphere, small baseplate  SURGEON:  Christasia Angeletti, Metta Clines M.D.  ASSISTANTS: Jenetta Loges, PA-C  ANESTHESIA:   General endotracheal as well as an interscalene block with Exparel  EBL: 200 cc  SPECIMEN: None  Drains: None   PATIENT DISPOSITION:  PACU - hemodynamically stable.    PLAN OF CARE: Admit for overnight observation  Brief history:  Felicia Washington has been followed for chronic and progressively increasing right shoulder pain related to end stage rotator cuff tear arthropathy.  Her clinical examination shows anterosuperior prominence of the humeral head with profound restrictions in mobility and severe pain with both active and passive motion.  Due to her increasing pain and functional mentation she is brought to the operating this time for planned right shoulder reverse arthroplasty.  Preoperatively and counseled Felicia Washington regarding treatment options as well as the potential risks versus benefits thereof.  Possible surgical complication reviewed including potential for bleeding, infection, neurovascular injury, persistent pain, anesthetic complication, failure of the implant, and possible need for additional surgery.  She understands and accepts and agrees with her planned procedure.  Procedure detail:  After undergoing routine preop evaluation patient received prophylactic antibiotics and interscalene block with Exparel was established in the holding area by the anesthesia department.  Placed supine on the operative table underwent smooth induction of a general endotracheal anesthesia.  Placed into the beachchair position and appropriate padding protected.  The right shoulder girdle region was sterilely prepped and draped in standard fashion.  Timeout was  called.  An anterior deltopectoral approach to the right shoulder was made through an 8 cm incision.  Skin flaps were elevated dissection carried deeply cephalic vein taken laterally deltoid and the deltopectoral interval was then developed with electrocautery used for hemostasis.  Upper border of the pectoralis major tendon was then tenotomized to improve exposure and adhesions were then divided beneath the deltoid and then a subscapularis peel was used to remove the subscap from the lesser tuberosity.  The free margin was then tagged with a pair of suture tape sutures.  We then divided the capsular attachments from the anterior inferior and inferior margins of the humeral neck deliver the humeral head through the wound.  Extra medullary guide was then used to outline the proposed humeral head resection was then completed with an oscillating saw at approximate 20 degrees retroversion.  Metal cap was placed with cut proximal humeral surface we then turned our attention to the glenoid which was exposed with standard retractors circumferential labral resection was then performed.  Guidepin placed into the center of the glenoid with a 10 degree inferior tilt and this was then reamed with central peripheral reamer obtaining excellent subchondral bony bed for placement of the baseplate.  The baseplate was then transfixed with a central lag screw followed by the inferior and superior locking screws all of which obtained excellent purchase and fixation.  A 36+4 glenosphere was then placed onto the baseplate.  We then returned to attention back to the proximal humerus and remove several retained suture anchors from the humeral metaphysis and then performed hand reaming the canal up to size 6 broached up to size 5.5 with excellent canal fit fixation.  The proximal metaphysis was then reamed with a posterior offset reamer.  Trial was placed  and this showed good soft tissue balance and excellent stability.  Our final size 5.5  stem with posterior offset metaphysis with a assembled and then impacted with excellent interference fit and fixation.  We then performed a series of trial reductions and felt that the +6 polyethylene insert gave [place] excellent soft tissue balance good stability.  The final +6 poly-was then impacted the joint was copiously irrigated final reduction was then performed and this again showed excellent shoulder motion good stability.  At this point the subscapularis showed good elasticity and so it was repaired back to the collar of our implant using the previously placed suture tapes to the eyelets of the collar.  The deltopectoral interval was then reapproximated with a series of figure-of-eight #1 Vicryl sutures.  2-0 Vicryl used with subcu layer intracuticular through Monocryl for the skin followed by Dermabond and Aquasol dressing.  Patient was then awakened and extubated and taken recovery in stable condition with right arm in a sling.  Jenetta Loges, PA-C was used as an Environmental consultant throughout this case essential for help with positioning the patient, position extremity, tissue manipulation, suture management, implantation prosthesis, wound closure, and intraoperative decision-making.  Marin Shutter MD   Contact # [phone]

## 2018-09-01 NOTE — Plan of Care (Signed)

## 2018-09-01 NOTE — Anesthesia Procedure Notes (Signed)
Procedure Name: Intubation Date/Time: 09/01/2018 7:36 AM Performed by: Gwyndolyn Saxon, CRNA Pre-anesthesia Checklist: Patient identified, Emergency Drugs available, Suction available and Patient being monitored Patient Re-evaluated:Patient Re-evaluated prior to induction Oxygen Delivery Method: Circle System Utilized Preoxygenation: Pre-oxygenation with 100% oxygen Induction Type: IV induction Ventilation: Mask ventilation without difficulty Laryngoscope Size: Mac and 3 Grade View: Grade I Tube type: Oral Tube size: 7.0 mm Number of attempts: 1 Airway Equipment and Method: Stylet and Oral airway Placement Confirmation: ETT inserted through vocal cords under direct vision,  positive ETCO2 and breath sounds checked- equal and bilateral Secured at: 21 cm Tube secured with: Tape Dental Injury: Teeth and Oropharynx as per pre-operative assessment  Comments: Intubated by [facility]

## 2018-09-01 NOTE — Anesthesia Procedure Notes (Signed)
Anesthesia Regional Block: Interscalene brachial plexus block   Pre-Anesthetic Checklist: ,, timeout performed, Correct Patient, Correct Site, Correct Laterality, Correct Procedure, Correct Position, site marked, Risks and benefits discussed,  Surgical consent,  Pre-op evaluation,  At surgeon's request and post-op pain management  Laterality: Right  Prep: chloraprep       Needles:  Injection technique: Single-shot  Needle Type: Echogenic Stimulator Needle     Needle Length: 5cm  Needle Gauge: 22     Additional Needles:   Procedures:, nerve stimulator,,,,,,,  Narrative:  Start time: 09/01/2018 7:01 AM End time: 09/01/2018 7:13 AM Injection made incrementally with aspirations every 5 mL.  Performed by: Personally  Anesthesiologist: Albertha Ghee, MD  Additional Notes: Functioning IV was confirmed and monitors were applied.  A 23mm 22ga Arrow echogenic stimulator needle was used. Sterile prep and drape,hand hygiene and sterile gloves were used.  Negative aspiration and negative test dose prior to incremental administration of local anesthetic. The patient tolerated the procedure well.  Ultrasound guidance: relevent anatomy identified, needle position confirmed, local anesthetic spread visualized around nerve(s), vascular puncture avoided.  Image printed for medical record.

## 2018-09-01 NOTE — Transfer of Care (Signed)
Immediate Anesthesia Transfer of Care Note  Patient: Felicia Washington  Procedure(s) Performed: RIGHT REVERSE SHOULDER ARTHROPLASTY (Right Shoulder)  Patient Location: PACU  Anesthesia Type:GA combined with regional for post-op pain  Level of Consciousness: awake, alert  and oriented  Airway & Oxygen Therapy: Patient Spontanous Breathing and Patient connected to face mask oxygen  Post-op Assessment: Report given to RN and Post -op Vital signs reviewed and stable  Post vital signs: Reviewed and stable  Last Vitals:  Vitals Value Taken Time  BP 124/60 09/01/2018  9:35 AM  Temp 36.5 C 09/01/2018  9:35 AM  Pulse 89 09/01/2018  9:40 AM  Resp 22 09/01/2018  9:40 AM  SpO2 100 % 09/01/2018  9:40 AM  Vitals shown include unvalidated device data.  Last Pain:  Vitals:   09/01/18 0615  TempSrc:   PainSc: 0-No pain         Complications: No apparent anesthesia complications

## 2018-09-01 NOTE — Progress Notes (Signed)
Patient requesting for sleeping pill. Paged MD. Awaiting call back.

## 2018-09-02 ENCOUNTER — Encounter (HOSPITAL_COMMUNITY): Payer: Self-pay | Admitting: Orthopedic Surgery

## 2018-09-02 DIAGNOSIS — M13811 Other specified arthritis, right shoulder: Secondary | ICD-10-CM | POA: Diagnosis not present

## 2018-09-02 MED ORDER — METHOCARBAMOL 500 MG PO TABS: 500.0000 mg | ORAL_TABLET | Freq: Three times a day (TID) | ORAL | 1 refills | Status: DC | PRN

## 2018-09-02 MED ORDER — OXYCODONE-ACETAMINOPHEN 5-325 MG PO TABS: 1.0000 | ORAL_TABLET | ORAL | 0 refills | Status: DC | PRN

## 2018-09-02 MED ORDER — ONDANSETRON HCL 4 MG PO TABS: 4.0000 mg | ORAL_TABLET | Freq: Three times a day (TID) | ORAL | 0 refills | Status: DC | PRN

## 2018-09-02 NOTE — Progress Notes (Deleted)
Pt given meds whole in med cup. Pt poured all pills in mouth at once . Began to choke after swallowing meds.. Unable to speak and stridor heard. Began heimlich maneuver with effectiveness. Voice returned with some coughing. Placed on 3L o2 via nasal cannula. Assisted bedside to assist with airway clearance. Lungs clear with diminished bases.  Stayed at bedside to monitor. Notified MD.

## 2018-09-02 NOTE — Evaluation (Signed)
Occupational Therapy Evaluation Patient Details Name: Felicia Washington MRN: [id number] DOB: Apr 30, 1946 Today's Date: 09/02/2018    History of Present Illness Pt is a 72 y/o female now s/p reverse R TSA. PMHx includes HTN, anemia, asthma   Clinical Impression   This 72 y/o female presents with the above. At baseline pt is independent with ADLs, iADLs and functional mobility, was driving and working. Pt currently requiring modA for UB and LB ADLs secondary to RUE functional limitations, demonstrates functional mobility in room and hallway without AD and minguard-supervision throughout. Pt reports family will be staying with her initially to assist with ADLs PRN. Educated both pt/pt's family regarding shoulder protocol, sling management, HEP, safety and compensatory strategies for completing ADLs while maintaining precautions with pt and pt's family verbalizing understanding. Questions answered throughout. Feel pt is safe to return home from OT stand point and follow up as recommended per MD once medically ready. No further acute OT needs identified at this time. Will sign off.     Follow Up Recommendations  Follow surgeon's recommendation for DC plan and follow-up therapies;Supervision/Assistance - 24 hour    Equipment Recommendations  None recommended by OT           Precautions / Restrictions Precautions Precautions: Shoulder Type of Shoulder Precautions:  sling on at all times except ADL/exercise, okay to come out of sling if sitting in controlled environment, okay for AROM to e/w/h to tolerance, no resisted internal rotation; okay for lap slides and pendulums; okay for shoulder P/AA/AROM within following parameters for ADLs: ER 0-20, ABD 0-45, FF 0-60  Shoulder Interventions: Shoulder sling/immobilizer;At all times;Off for dressing/bathing/exercises Precaution Booklet Issued: Yes (comment) Precaution Comments: issued and reviewed with pt/pt's family  Required Braces or Orthoses:  Sling Restrictions Weight Bearing Restrictions: Yes RUE Weight Bearing: Non weight bearing      Mobility Bed Mobility Overal bed mobility: Needs Assistance Bed Mobility: Supine to Sit     Supine to sit: HOB elevated;Supervision     General bed mobility comments: for safety, no physical assist required; educated to exit bed to the L for increased adherence to NWB status in RUE   Transfers Overall transfer level: Needs assistance Equipment used: None Transfers: Sit to/from Stand Sit to Stand: Supervision         General transfer comment: for safety, no LOB noted     Balance Overall balance assessment: No apparent balance deficits (not formally assessed)                                         ADL either performed or assessed with clinical judgement   ADL Overall ADL's : Needs assistance/impaired Eating/Feeding: Independent;Sitting   Grooming: Set up;Sitting;Min guard;Standing   Upper Body Bathing: Minimal assistance;Sitting   Lower Body Bathing: Minimal assistance;Sit to/from stand Lower Body Bathing Details (indicate cue type and reason): educated on performing bathing task while sitting for increased safety and ease of task completion  Upper Body Dressing : Moderate assistance;Sitting Upper Body Dressing Details (indicate cue type and reason): educated both pt/pt's family in techniques for donning shirt and for sling management  Lower Body Dressing: Moderate assistance;Sit to/from stand Lower Body Dressing Details (indicate cue type and reason): assist to thread LEs into underwear and to advance over hips in standing  Toilet Transfer: Min guard;Ambulation;Regular Museum/gallery exhibitions officer and Hygiene: Min guard;Sit to/from stand  Functional mobility during ADLs: Min guard General ADL Comments: educated both pt/pt's family regarding shoulder precautions, safety and compensatory strategies for completing ADLs while maintaining  precautions; pt and pt's family verbalizing and return demonstrating understanding                          Pertinent Vitals/Pain Pain Assessment: No/denies pain(still feeling nerve block )     Hand Dominance Right   Extremity/Trunk Assessment Upper Extremity Assessment Upper Extremity Assessment: RUE deficits/detail RUE Deficits / Details: s/p reverse R TSA; pt reports arm still feels "dead" s/p surgery, able to wiggle fingers but otherwise with decreased control of UE from recent nerve block  RUE: Unable to fully assess due to immobilization RUE Sensation: decreased light touch RUE Coordination: decreased gross motor   Lower Extremity Assessment Lower Extremity Assessment: Overall WFL for tasks assessed       Communication Communication Communication: No difficulties   Cognition Arousal/Alertness: Awake/alert Behavior During Therapy: WFL for tasks assessed/performed Overall Cognitive Status: Within Functional Limits for tasks assessed                                     General Comments       Exercises Shoulder Exercises Pendulum Exercise: PROM;10 reps;Right;Standing Elbow Flexion: PROM;5 reps;Right;Seated;Limitations Elbow Flexion Limitations: PROM due to pt still feeling effects of nerve block  Elbow Extension: PROM;5 reps;Right;Seated Wrist Flexion: PROM;10 reps;Right;Seated;Limitations Wrist Flexion Limitations: PROM due to pt still feeling effects of nerve block  Wrist Extension: PROM;Right;Seated;10 reps Digit Composite Flexion: AROM;10 reps;Right;Seated Composite Extension: AROM;10 reps;Right;Seated Neck Flexion: AROM;Seated Neck Extension: AROM;Seated Neck Lateral Flexion - Right: AROM;Seated Neck Lateral Flexion - Left: AROM;Seated Hand Exercises Forearm Supination: PROM;10 reps;Right;Seated;Limitations Forearm Supination Limitations: PROM due to pt still feeling effects of nerve block  Forearm Pronation: PROM;10 reps;Seated;Right    Shoulder Instructions Shoulder Instructions Donning/doffing shirt without moving shoulder: Modified independent;Caregiver independent with task;Patient able to independently direct caregiver Method for sponge bathing under operated UE: Min-guard;Caregiver independent with task;Patient able to independently direct caregiver Donning/doffing sling/immobilizer: Moderate assistance;Caregiver independent with task;Patient able to independently direct caregiver Correct positioning of sling/immobilizer: Minimal assistance;Caregiver independent with task;Patient able to independently direct caregiver Pendulum exercises (written home exercise program): Min-guard ROM for elbow, wrist and digits of operated UE: Min-guard Sling wearing schedule (on at all times/off for ADL's): Independent Proper positioning of operated UE when showering: Min-guard Positioning of UE while sleeping: Min-guard    Home Living Family/patient expects to be discharged to:: Private residence Living Arrangements: Alone Available Help at Discharge: Family;Available 24 hours/day(sister initially and then daughters to assist as well ) Type of Home: House   Entrance Stairs-Number of Steps: 2   Home Layout: One level     Bathroom Shower/Tub: Occupational psychologist: Standard     Home Equipment: Shower seat          Prior Functioning/Environment Level of Independence: Independent        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[employer], driving         OT Problem List: Decreased strength;Decreased range of motion;Impaired UE functional use      OT Treatment/Interventions:      OT Goals(Current goals can be found in the care plan section) Acute Rehab OT Goals Patient Stated Goal: home today, to shower  OT Goal Formulation: All assessment and education complete, DC therapy  OT Frequency:  Barriers to D/C:                          AM-PAC PT "6 Clicks" Daily Activity     Outcome Measure Help from another  person eating meals?: None Help from another person taking care of personal grooming?: A Little Help from another person toileting, which includes using toliet, bedpan, or urinal?: A Little Help from another person bathing (including washing, rinsing, drying)?: A Little Help from another person to put on and taking off regular upper body clothing?: A Lot Help from another person to put on and taking off regular lower body clothing?: A Little 6 Click Score: 18   End of Session Equipment Utilized During Treatment: Other (comment)(sling ) Nurse Communication: Mobility status  Activity Tolerance: Patient tolerated treatment well Patient left: with family/visitor present;Other (comment)(sitting EOB )  OT Visit Diagnosis: Other (comment)(s/p reverse R TSA )                Time: 5625-6389 OT Time Calculation (min): 38 min Charges:  OT General Charges $OT Visit: 1 Visit OT Evaluation $OT Eval Low Complexity: 1 Low OT Treatments $Self Care/Home Management : 8-22 mins  Lou Cal, OT Pager [phone] 09/02/2018   Raymondo Band 09/02/2018, 10:00 AM

## 2018-09-02 NOTE — Anesthesia Postprocedure Evaluation (Signed)
Anesthesia Post Note  Patient: Felicia Washington  Procedure(s) Performed: RIGHT REVERSE SHOULDER ARTHROPLASTY (Right Shoulder)     Patient location during evaluation: PACU Anesthesia Type: Regional and General Level of consciousness: awake and alert Pain management: pain level controlled Vital Signs Assessment: post-procedure vital signs reviewed and stable Respiratory status: spontaneous breathing, nonlabored ventilation, respiratory function stable and patient connected to nasal cannula oxygen Cardiovascular status: blood pressure returned to baseline and stable Postop Assessment: no apparent nausea or vomiting Anesthetic complications: no    Last Vitals:  Vitals:   09/02/18 0100 09/02/18 0552  BP: 100/71 101/76  Pulse: 77 85  Resp: 14 16  Temp: 36.6 C 36.9 C  SpO2: 100% 100%    Last Pain:  Vitals:   09/02/18 0940  TempSrc:   PainSc: 0-No pain                 Kingsley Farace S

## 2018-09-02 NOTE — Discharge Summary (Signed)
PATIENT ID:      Felicia Washington  MRN:     [id number] DOB/AGE:    [birth year] / 72 y.o.     DISCHARGE SUMMARY  ADMISSION DATE:    09/01/2018 DISCHARGE DATE:    ADMISSION DIAGNOSIS: right shoulder rotator cuff tear arthropathy Past Medical History:  Diagnosis Date  . Anemia   . Arthritis    "fingers, shoulders" (09/01/2018)  . Asthma   . Celiac disease   . GERD (gastroesophageal reflux disease)   . Graves disease   . Heart murmur   . History of blood transfusion    "related to c-section"  . History of gout    fingers  . HLD (hyperlipidemia)   . HTN (hypertension)   . Hypothyroidism   . Migraine    "1 q couple years" (09/01/2018)  . Pneumonia    "several times in 1 yr" (09/01/2018)  . PONV (postoperative nausea and vomiting)   . Seasonal allergies     DISCHARGE DIAGNOSIS:   Active Problems:   S/P reverse total shoulder arthroplasty, right   PROCEDURE: Procedure(s): RIGHT REVERSE SHOULDER ARTHROPLASTY on 09/01/2018  CONSULTS:    HISTORY:  See H&P in chart.  HOSPITAL COURSE:  Felicia Washington is a 72 y.o. admitted on 09/01/2018 with a diagnosis of right shoulder rotator cuff tear arthropathy.  They were brought to the operating room on 09/01/2018 and underwent Procedure(s): RIGHT REVERSE SHOULDER ARTHROPLASTY.    They were given perioperative antibiotics:  Anti-infectives (From admission, onward)   Start     Dose/Rate Route Frequency Ordered Stop   09/02/18 1000  ampicillin (PRINCIPEN) capsule 500 mg     500 mg Oral Daily 09/01/18 1031     09/01/18 2200  doxycycline (VIBRAMYCIN) 50 MG capsule 50 mg     50 mg Oral 2 times daily 09/01/18 1031     09/01/18 0600  ceFAZolin (ANCEF) IVPB 2g/100 mL premix     2 g 200 mL/hr over 30 Minutes Intravenous On call to O.R. 09/01/18 3662 09/01/18 9476    .  Patient underwent the above named procedure and tolerated it well. The following day they were hemodynamically stable and pain was controlled on oral analgesics. They were neurovascularly  intact to the operative extremity. OT was ordered and worked with patient per protocol. They were medically and orthopaedically stable for discharge on day 1 .    DIAGNOSTIC STUDIES:  RECENT RADIOGRAPHIC STUDIES :  No results found.  RECENT VITAL SIGNS:   Patient Vitals for the past 24 hrs:  BP Temp Temp src Pulse Resp SpO2  09/02/18 0552 101/76 98.5 F (36.9 C) Oral 85 16 100 %  09/02/18 0100 100/71 97.9 F (36.6 C) Oral 77 14 100 %  09/01/18 2100 105/72 98 F (36.7 C) Oral 79 16 99 %  09/01/18 1607 109/78 98.4 F (36.9 C) Oral 85 - 99 %  09/01/18 1044 110/67 97.6 F (36.4 C) Oral 78 - 100 %  09/01/18 1015 108/64 98 F (36.7 C) - 87 16 100 %  09/01/18 1000 - - - 81 (!) 21 100 %  09/01/18 0950 109/62 - - 84 19 100 %  09/01/18 0935 124/60 97.7 F (36.5 C) - 88 20 100 %  .  RECENT EKG RESULTS:    Orders placed or performed during the hospital encounter of 08/23/18  . EKG 12-Lead  . EKG 12-Lead  . EKG 12-Lead  . EKG 12-Lead    DISCHARGE INSTRUCTIONS:    DISCHARGE  MEDICATIONS:   Allergies as of 09/02/2018      Reactions   Adhesive [tape] Other (See Comments)   Removes skin   Codeine Nausea Only      Medication List    TAKE these medications   allopurinol 100 MG tablet Commonly known as:  ZYLOPRIM Take 100 mg by mouth 2 (two) times daily.   ampicillin 500 MG capsule Commonly known as:  PRINCIPEN Take 500 mg by mouth daily.   atorvastatin 40 MG tablet Commonly known as:  LIPITOR Take 40 mg by mouth daily.   Colchicine 0.6 MG Caps Take 0.6 mg by mouth daily.   doxycycline 50 MG capsule Commonly known as:  VIBRAMYCIN Take 50 mg by mouth 2 (two) times daily.   fenofibrate micronized 67 MG capsule Commonly known as:  LOFIBRA Take 67 mg by mouth daily.   Fish Oil 1000 MG Caps Take 1,000 mg by mouth 2 (two) times daily.   hydrochlorothiazide 25 MG tablet Commonly known as:  HYDRODIURIL Take 25 mg by mouth daily.   levothyroxine 112 MCG  tablet Commonly known as:  SYNTHROID, LEVOTHROID Take 124 mcg by mouth daily before breakfast.   magnesium oxide 400 MG tablet Commonly known as:  MAG-OX Take 400 mg by mouth 2 (two) times daily.   methocarbamol 500 MG tablet Commonly known as:  ROBAXIN Take 1 tablet (500 mg total) by mouth every 8 (eight) hours as needed for muscle spasms.   montelukast 10 MG tablet Commonly known as:  SINGULAIR Take 10 mg by mouth at bedtime.   olmesartan 40 MG tablet Commonly known as:  BENICAR Take 40 mg by mouth daily.   Omeprazole 20 MG Tbdd Take 20 mg by mouth 2 (two) times daily.   ondansetron 4 MG tablet Commonly known as:  ZOFRAN Take 1 tablet (4 mg total) by mouth every 8 (eight) hours as needed for nausea or vomiting.   oxyCODONE-acetaminophen 5-325 MG tablet Commonly known as:  PERCOCET/ROXICET Take 1 tablet by mouth every 4 (four) hours as needed (max 6 q).   PROAIR HFA 108 (90 Base) MCG/ACT inhaler Generic drug:  albuterol Inhale 2 puffs into the lungs every 6 (six) hours as needed for wheezing or shortness of breath.   SOOLANTRA 1 % Crea Generic drug:  Ivermectin Apply 1 application topically every morning.       FOLLOW UP VISIT:   Follow-up Information    Justice Britain, MD.   Specialty:  Orthopedic Surgery Why:  call to be seen in 10-14 days Contact information: [address] [place] [address] [phone]           DISCHARGE TO: Home   DISCHARGE CONDITION:  Felicia Washington for Dr. Justice Britain 09/02/2018, 8:26 AM

## 2018-09-02 NOTE — Progress Notes (Signed)
Written and verbal discharged instructions given to family and x2 daughters. Removed peripheral IV without complications. Notified Network engineer for transport assistance.

## 2018-09-02 NOTE — Progress Notes (Signed)
Felicia Washington  MRN: [id number] DOB/Age: 1946/05/21 72 y.o. [name] Procedure: Procedure(s) (LRB): RIGHT REVERSE SHOULDER ARTHROPLASTY (Right)     Subjective: Patient still comfortable and numb from block She was medicated last night only because she could feel her fingers, not because of pain  Vital Signs Temp:  [97.6 F (36.4 C)-98.5 F (36.9 C)] 98.5 F (36.9 C) (09/06 0552) Pulse Rate:  [77-88] 85 (09/06 0552) Resp:  [14-21] 16 (09/06 0552) BP: (100-124)/(60-78) 101/76 (09/06 0552) SpO2:  [99 %-100 %] 100 % (09/06 0552)  Lab Results No results for input(s): WBC, HGB, HCT, PLT in the last 72 hours. BMET No results for input(s): NA, K, CL, CO2, GLUCOSE, BUN, CREATININE, CALCIUM in the last 72 hours. No results found for: INR   Exam Block still in effect        Plan Educated patient and family on block and expectations DC after OT  Jenetta Loges PA-C  09/02/2018, 8:20 AM Contact # [phone]

## 2018-09-24 ENCOUNTER — Encounter (HOSPITAL_BASED_OUTPATIENT_CLINIC_OR_DEPARTMENT_OTHER): Payer: Self-pay | Admitting: Emergency Medicine

## 2018-09-24 ENCOUNTER — Other Ambulatory Visit: Payer: Self-pay

## 2018-09-24 ENCOUNTER — Emergency Department (HOSPITAL_BASED_OUTPATIENT_CLINIC_OR_DEPARTMENT_OTHER)
Admission: EM | Admit: 2018-09-24 | Discharge: 2018-09-24 | Disposition: A | Discharge status: Home / Self Care | Payer: Managed Care, Other (non HMO) | Attending: Emergency Medicine | Admitting: Emergency Medicine

## 2018-09-24 DIAGNOSIS — E039 Hypothyroidism, unspecified: Secondary | ICD-10-CM | POA: Diagnosis not present

## 2018-09-24 DIAGNOSIS — Z79899 Other long term (current) drug therapy: Secondary | ICD-10-CM | POA: Diagnosis not present

## 2018-09-24 DIAGNOSIS — I10 Essential (primary) hypertension: Secondary | ICD-10-CM | POA: Diagnosis not present

## 2018-09-24 DIAGNOSIS — R21 Rash and other nonspecific skin eruption: Secondary | ICD-10-CM | POA: Diagnosis not present

## 2018-09-24 DIAGNOSIS — R600 Localized edema: Secondary | ICD-10-CM | POA: Insufficient documentation

## 2018-09-24 MED ORDER — FAMOTIDINE 20 MG PO TABS: 20.0000 mg | ORAL_TABLET | Freq: Two times a day (BID) | ORAL | 0 refills | Status: DC

## 2018-09-24 MED ORDER — FAMOTIDINE 20 MG PO TABS
20.0000 mg | ORAL_TABLET | Freq: Once | ORAL | Status: AC
  Administered 2018-09-24: 20 mg via ORAL
  Filled 2018-09-24: qty 1

## 2018-09-24 MED ORDER — PREDNISONE 20 MG PO TABS
40.0000 mg | ORAL_TABLET | Freq: Once | ORAL | Status: AC
  Administered 2018-09-24: 40 mg via ORAL
  Filled 2018-09-24: qty 2

## 2018-09-24 MED ORDER — PREDNISONE 10 MG PO TABS: 20.0000 mg | ORAL_TABLET | Freq: Every day | ORAL | 0 refills | Status: DC

## 2018-09-24 NOTE — ED Notes (Signed)
ED Provider at bedside. 

## 2018-09-24 NOTE — ED Triage Notes (Signed)
Oral swelling since yesterday with pain the roof of her mouth.

## 2018-09-24 NOTE — Discharge Instructions (Signed)
The cause of your facial swelling was unclear today but may be related to your Benicar - please stop taking this medication and follow up with your doctor regarding different medications you can take.  You may take zyrtec, available over the counter for itching.  You may also take benadryl if you have continued itching.  Get rechecked immediately if you have difficulty breathing, swelling of the tongue or throat or new concerning symptoms.

## 2018-09-24 NOTE — ED Provider Notes (Signed)
[facility] EMERGENCY DEPARTMENT Provider Note   CSN: [id number] Arrival date & time: 09/24/18  0827     History   Chief Complaint Chief Complaint  Patient presents with  . Allergic Reaction    HPI Felicia Washington is a 72 y.o. female.  The history is provided by the patient. No language interpreter was used.  Allergic Reaction   Felicia Washington is a 72 y.o. female who presents to the Emergency Department complaining of lip swelling. He presents to the emergency department complaining of swelling to her lips and tongue. On September 5 that she had a shoulder surgery performed and following the procedure she noted that she had an itchy rash to her chest, arms and legs. Overall the rash has mildly worsened. Five days ago she was eating popcorn and noticed irritation in her mouth. Yesterday she noticed increased pain and swelling to her lower lip as well as the roof of her mouth. Last night she had swelling to her tongue to the point that it did not fit in her mouth she noted swelling to her lower lip and face today and she presents for evaluation. Overall the tongue swelling has resolved. She has ongoing soreness to the roof of her mouth as well as ongoing swelling to her lower lip and cheeks. She denies any fevers, difficulty breathing. She has itching to the rash on her arms and legs but no itching in the mouth. 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 She denies any new medications. She did use I remapped and cream prior to her surgery. Past Medical History:  Diagnosis Date  . Anemia   . Arthritis    "fingers, shoulders" (09/01/2018)  . Asthma   . Celiac disease   . GERD (gastroesophageal reflux disease)   . Graves disease   . Heart murmur   . History of blood transfusion    "related to c-section"  . History of gout    fingers  . HLD (hyperlipidemia)   . HTN (hypertension)   . Hypothyroidism   . Migraine    "1 q couple years" (09/01/2018)  . Pneumonia     "several times in 1 yr" (09/01/2018)  . PONV (postoperative nausea and vomiting)   . Seasonal allergies     Patient Active Problem List   Diagnosis Date Noted  . 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 URTICARIA 02/08/2009  . COUGH 01/03/2009  . Nonspecific (abnormal) findings on radiological and other examination of body structure 01/03/2009  . ABNORMAL LUNG XRAY 01/03/2009  . HYPOTHYROIDISM 03/16/2008  . DYSLIPIDEMIA 03/16/2008  . HYPERTENSION 03/16/2008  . GERD 03/16/2008  . CELIAC DISEASE 03/16/2008  . COLONIC POLYPS 10/19/2007  . ESOPHAGITIS, REFLUX 10/19/2007  . DIVERTICULOSIS, COLON 10/19/2007    Past Surgical History:  Procedure Laterality Date  . CESAREAN SECTION  1975  . COLONOSCOPY    . POLYPECTOMY    . REVERSE SHOULDER ARTHROPLASTY Right 09/01/2018  . REVERSE SHOULDER ARTHROPLASTY Right 09/01/2018   Procedure: RIGHT REVERSE SHOULDER ARTHROPLASTY;  Surgeon: Justice Britain, MD;  Location: [facility];  Service: Orthopedics;  Laterality: Right;  . SHOULDER ARTHROSCOPY W/ ROTATOR CUFF REPAIR Bilateral   . THYROIDECTOMY    . TONSILLECTOMY       OB History   None      Home Medications    Prior to Admission medications   Medication Sig Start Date End Date Taking? Authorizing Provider  albuterol (PROAIR  HFA) 108 (90 Base) MCG/ACT inhaler Inhale 2 puffs into the lungs every 6 (six) hours as needed for wheezing or shortness of breath.    [provider]  allopurinol (ZYLOPRIM) 100 MG tablet Take 100 mg by mouth 2 (two) times daily. 08/09/18   [provider]  ampicillin (PRINCIPEN) 500 MG capsule Take 500 mg by mouth daily.  03/07/16   [provider]  atorvastatin (LIPITOR) 40 MG tablet Take 40 mg by mouth daily.    [provider]  Colchicine 0.6 MG CAPS Take 0.6 mg by mouth daily. 07/17/18   [provider]  doxycycline (VIBRAMYCIN) 50 MG capsule Take 50 mg by mouth 2  (two) times daily. 08/02/18   [provider]  famotidine (PEPCID) 20 MG tablet Take 1 tablet (20 mg total) by mouth 2 (two) times daily. 09/24/18   Quintella Reichert, MD  fenofibrate micronized (LOFIBRA) 67 MG capsule Take 67 mg by mouth daily. 08/08/18   [provider]  hydrochlorothiazide (HYDRODIURIL) 25 MG tablet Take 25 mg by mouth daily.     [provider]  levothyroxine (SYNTHROID, LEVOTHROID) 112 MCG tablet Take 124 mcg by mouth daily before breakfast.     [provider]  magnesium oxide (MAG-OX) 400 MG tablet Take 400 mg by mouth 2 (two) times daily.    [provider]  methocarbamol (ROBAXIN) 500 MG tablet Take 1 tablet (500 mg total) by mouth every 8 (eight) hours as needed for muscle spasms. 09/02/18   Shuford, Olivia Mackie, PA-C  montelukast (SINGULAIR) 10 MG tablet Take 10 mg by mouth at bedtime.    [provider]  Omega-3 Fatty Acids (FISH OIL) 1000 MG CAPS Take 1,000 mg by mouth 2 (two) times daily.    [provider]  Omeprazole 20 MG TBDD Take 20 mg by mouth 2 (two) times daily.     [provider]  ondansetron (ZOFRAN) 4 MG tablet Take 1 tablet (4 mg total) by mouth every 8 (eight) hours as needed for nausea or vomiting. 09/02/18   Shuford, Olivia Mackie, PA-C  predniSONE (DELTASONE) 10 MG tablet Take 2 tablets (20 mg total) by mouth daily. 09/25/18   Quintella Reichert, MD    Family History Family History  Problem Relation Age of Onset  . Multiple myeloma Father   . Bone cancer Father   . Celiac disease Sister   . Colon polyps Sister   . Thyroid disease Mother   . Gallstones Mother   . Heart attack Maternal Grandfather   . Colon polyps Brother   . Diabetes Maternal Grandmother     Social History Social History   Tobacco Use  . Smoking status: Never Smoker  . Smokeless tobacco: Never Used  Substance Use Topics  . Alcohol use: No    Alcohol/week: 0.0 standard drinks  . Drug use: No     Allergies   Adhesive  [tape] and Codeine   Review of Systems Review of Systems  All other systems reviewed and are negative.    Physical Exam Updated Vital Signs BP (!) 144/89 (BP Location: Left Arm)   Pulse 92   Temp 98.3 F (36.8 C) (Oral)   Resp 18   Ht _0  (1.651 m)   Wt 63.5 kg   SpO2 100%   BMI 23.30 kg/m   Physical Exam  Constitutional: She is oriented to person, place, and time. She appears well-developed and well-nourished.  HENT:  Head: Normocephalic and atraumatic.  No edema of the tongue  or posterior oropharynx. There is minimal erythema to the soft palate. There is mild to moderate edema of the lower lip as well as the lower cheeks.  Cardiovascular: Normal rate and regular rhythm.  No murmur heard. Pulmonary/Chest: Effort normal and breath sounds normal. No respiratory distress.  Abdominal: Soft. There is no tenderness. There is no rebound and no guarding.  Musculoskeletal: She exhibits no edema or tenderness.  Neurological: She is alert and oriented to person, place, and time.  Skin: Skin is warm and dry.  Faint, scattered macular papular rash to upper chest, bilateral upper extremities as well as the left lower extremity.  Psychiatric: She has a normal mood and affect. Her behavior is normal.  Nursing note and vitals reviewed.    ED Treatments / Results  Labs (all labs ordered are listed, but only abnormal results are displayed) Labs Reviewed - No data to display  EKG None  Radiology No results found.  Procedures Procedures (including critical care time)  Medications Ordered in ED Medications  predniSONE (DELTASONE) tablet 40 mg (has no administration in time range)  famotidine (PEPCID) tablet 20 mg (has no administration in time range)     Initial Impression / Assessment and Plan / ED Course  I have reviewed the triage vital signs and the nursing notes.  Pertinent labs & imaging results that were available during my care of the patient were reviewed by me  and considered in my medical decision making (see chart for details).     Patient here for evaluation of swelling to lips, tongue as well as rash. In terms of the rash this is a mild scattered rash that is been present for the last few weeks, feel this is likely unrelated to her facial swelling today. Question if this is a reaction related to the ever marked and used prior to her surgery. In terms of the facial/tongue swelling. There is no evidence of acute infectious process at this time. There may be some elements of angioedema based on the report of tongue swelling prior to ED arrival as well as lower lip swelling currently. Discussed discontinuing her Benicar as this may be the culprit. Will treat with steroids, Pepcid. Plan to discharge home with close outpatient follow-up as well as return precautions.  Final Clinical Impressions(s) / ED Diagnoses   Final diagnoses:  Facial edema  Rash    ED Discharge Orders         Ordered    predniSONE (DELTASONE) 10 MG tablet  Daily     09/24/18 0850    famotidine (PEPCID) 20 MG tablet  2 times daily     09/24/18 0850           Quintella Reichert, MD 09/24/18 [phone]

## 2018-10-07 ENCOUNTER — Institutional Professional Consult (permissible substitution): Payer: Managed Care, Other (non HMO) | Admitting: Internal Medicine

## 2019-04-14 DIAGNOSIS — I129 Hypertensive chronic kidney disease with stage 1 through stage 4 chronic kidney disease, or unspecified chronic kidney disease: Secondary | ICD-10-CM | POA: Insufficient documentation

## 2019-06-05 DIAGNOSIS — N183 Chronic kidney disease, stage 3 (moderate): Secondary | ICD-10-CM | POA: Diagnosis not present

## 2019-06-08 DIAGNOSIS — I129 Hypertensive chronic kidney disease with stage 1 through stage 4 chronic kidney disease, or unspecified chronic kidney disease: Secondary | ICD-10-CM | POA: Diagnosis not present

## 2019-06-08 DIAGNOSIS — E559 Vitamin D deficiency, unspecified: Secondary | ICD-10-CM | POA: Diagnosis not present

## 2019-06-08 DIAGNOSIS — N183 Chronic kidney disease, stage 3 (moderate): Secondary | ICD-10-CM | POA: Diagnosis not present

## 2019-08-30 DIAGNOSIS — R609 Edema, unspecified: Secondary | ICD-10-CM | POA: Diagnosis not present

## 2019-08-30 DIAGNOSIS — I10 Essential (primary) hypertension: Secondary | ICD-10-CM | POA: Diagnosis not present

## 2019-08-30 DIAGNOSIS — J45909 Unspecified asthma, uncomplicated: Secondary | ICD-10-CM | POA: Diagnosis not present

## 2019-08-30 DIAGNOSIS — E7849 Other hyperlipidemia: Secondary | ICD-10-CM | POA: Diagnosis not present

## 2019-08-30 DIAGNOSIS — N183 Chronic kidney disease, stage 3 (moderate): Secondary | ICD-10-CM | POA: Diagnosis not present

## 2019-08-30 DIAGNOSIS — Z23 Encounter for immunization: Secondary | ICD-10-CM | POA: Diagnosis not present

## 2019-08-30 DIAGNOSIS — R011 Cardiac murmur, unspecified: Secondary | ICD-10-CM | POA: Diagnosis not present

## 2019-08-30 DIAGNOSIS — K900 Celiac disease: Secondary | ICD-10-CM | POA: Diagnosis not present

## 2019-08-30 DIAGNOSIS — M109 Gout, unspecified: Secondary | ICD-10-CM | POA: Diagnosis not present

## 2019-08-30 DIAGNOSIS — E0500 Thyrotoxicosis with diffuse goiter without thyrotoxic crisis or storm: Secondary | ICD-10-CM | POA: Diagnosis not present

## 2019-08-30 DIAGNOSIS — E890 Postprocedural hypothyroidism: Secondary | ICD-10-CM | POA: Diagnosis not present

## 2019-09-06 ENCOUNTER — Other Ambulatory Visit (HOSPITAL_COMMUNITY): Payer: Self-pay | Admitting: Endocrinology

## 2019-09-06 DIAGNOSIS — R011 Cardiac murmur, unspecified: Secondary | ICD-10-CM

## 2019-09-06 DIAGNOSIS — R609 Edema, unspecified: Secondary | ICD-10-CM

## 2019-09-08 ENCOUNTER — Ambulatory Visit (HOSPITAL_COMMUNITY): Payer: Medicare Other | Attending: Cardiology

## 2019-09-08 ENCOUNTER — Other Ambulatory Visit: Payer: Self-pay

## 2019-09-08 DIAGNOSIS — R011 Cardiac murmur, unspecified: Secondary | ICD-10-CM

## 2019-09-08 DIAGNOSIS — R609 Edema, unspecified: Secondary | ICD-10-CM | POA: Diagnosis not present

## 2019-09-19 ENCOUNTER — Telehealth: Payer: Self-pay | Admitting: Internal Medicine

## 2019-09-19 NOTE — Telephone Encounter (Signed)
lmtcb for pt. We do not have any records of seeing this patient in our clinic, and do not have a referral in chart to see patient.

## 2019-09-20 NOTE — Telephone Encounter (Signed)
LMTCB x2 for pt 

## 2019-09-21 NOTE — Telephone Encounter (Signed)
Patient scheduled for appt with Dr. Melvyn Novas on 09/26/19 there is a referral from Dr. Forde Dandy for Edema.  Nothing further needed at this time.

## 2019-09-25 DIAGNOSIS — R768 Other specified abnormal immunological findings in serum: Secondary | ICD-10-CM | POA: Diagnosis not present

## 2019-09-25 DIAGNOSIS — Z79899 Other long term (current) drug therapy: Secondary | ICD-10-CM | POA: Diagnosis not present

## 2019-09-25 DIAGNOSIS — M1A9XX1 Chronic gout, unspecified, with tophus (tophi): Secondary | ICD-10-CM | POA: Diagnosis not present

## 2019-09-25 DIAGNOSIS — M150 Primary generalized (osteo)arthritis: Secondary | ICD-10-CM | POA: Diagnosis not present

## 2019-09-25 DIAGNOSIS — M2550 Pain in unspecified joint: Secondary | ICD-10-CM | POA: Diagnosis not present

## 2019-09-26 ENCOUNTER — Institutional Professional Consult (permissible substitution): Payer: Medicare Other | Admitting: Internal Medicine

## 2019-09-27 ENCOUNTER — Ambulatory Visit (INDEPENDENT_AMBULATORY_CARE_PROVIDER_SITE_OTHER)
Admission: RE | Admit: 2019-09-27 | Discharge: 2019-09-27 | Disposition: A | Discharge status: Home / Self Care | Payer: Medicare Other | Source: Ambulatory Visit | Attending: Pulmonary Disease | Admitting: Pulmonary Disease

## 2019-09-27 ENCOUNTER — Other Ambulatory Visit: Payer: Self-pay

## 2019-09-27 ENCOUNTER — Encounter: Payer: Self-pay | Admitting: Pulmonary Disease

## 2019-09-27 ENCOUNTER — Ambulatory Visit (INDEPENDENT_AMBULATORY_CARE_PROVIDER_SITE_OTHER): Payer: Medicare Other | Admitting: Pulmonary Disease

## 2019-09-27 VITALS — BP 118/82 | HR 85 | Temp 98.1°F | Ht 66.0 in | Wt 139.8 lb

## 2019-09-27 DIAGNOSIS — R600 Localized edema: Secondary | ICD-10-CM | POA: Diagnosis not present

## 2019-09-27 DIAGNOSIS — R609 Edema, unspecified: Secondary | ICD-10-CM | POA: Diagnosis not present

## 2019-09-27 NOTE — Progress Notes (Signed)
[facility]  Chief Complaint  Patient presents with  . Consult    Edema in feet and ankles for last 6 - 8 weeks. Has been evaluated and cleared by cardiology.    Constitutional:  BP 118/82 (BP Location: Right Arm, Patient Position: Sitting, Cuff Size: Normal)   Pulse 85   Temp 98.1 F (36.7 C)   Ht 5' 6"  (1.676 m)   Wt 139 lb 12.8 oz (63.4 kg)   SpO2 99% Comment: on room air  BMI 22.56 kg/m   Past Medical History:  Allergies, Asthma, PNA, Migraine HA, HTN, Graves disease, Hypothyroidism, HLD, Gout, GERD, Celiac disease, OA, Anemia  Brief Summary:  Felicia Washington is a 73 y.o. female with ankle edema.  She was seen years ago by Dr. Annamaria Boots for allergy and asthma.  She went to the beach a few weeks ago.  When she returned she noticed swelling in her feet.  This happens on both sides.  She has also noticed more prominence of varicose veins.  When she keeps her feet up at night, the swelling gets better.  She has not noticed any pain in her legs or discoloration.    She never smoked cigarettes.  She doesn't feel like she has any issues with her breathing at present.  Specifically, she isn't having cough, wheeze, sputum, shortness of breath, chest pain, fever, weight loss, hemoptysis, sweats.  She is due to see a rheumatologist for assessment of gout.  She denies history of other connective tissue diseases.  No history of thromboembolic disease.  She had Echocardiogram early this month with showed diastolic dysfunction, but otherwise normal.    She had lab work from 08/30/19 - BNP 70, Creatinine 0.8, protein 6.6, albumin 3.7, TSH 0.12, FT4 2, T3 89.9.  Physical Exam:   Appearance - well kempt   ENMT - clear nasal mucosa, midline nasal  septum, no oral exudates, no LAN, trachea midline  Respiratory - normal chest wall, normal respiratory effort, no accessory muscle use, no wheeze/rales  CV - s1s2 regular rate and rhythm, no murmurs, radial  pulses symmetric  GI - soft, non tender, no masses  Lymph - no adenopathy noted in neck and axillary areas  MSK - mild pitting edema in ankles b/l  Ext - no cyanosis, clubbing, or joint inflammation noted  Skin - no rashes, lesions, or ulcers  Neuro - normal strength, oriented x 3  Psych - normal mood and affect  Discussion:  She has recently development of ankle swelling.  She has history of asthma which seems well controlled on current medications.  She does not appear to have symptoms to suggest other underlying lung disease.  She did have recent echo that showed diastolic dysfunction, and she has mild findings of varicose veins.  Plan:  - for completeness will arrange for chest xray, pulmonary function test, and overnight oximetry - if these test are unrevealing, then it is unlikely that she has a pulmonary condition that would be contributing to her ankle swelling    Patient Instructions  Will arrange for chest xray, pulmonary function test, and overnight oxygen test  Follow up in 3 weeks    Chesley Mires, MD [place] Pager: [phone] 09/27/2019, 12:42 PM  Flow Sheet     Pulmonary tests:    Sleep tests:    Cardiac tests:  Echo 09/08/19 >> EF greater than 65%, grade 2 DD, normal RV function   Review of Systems:  Constitutional:  Negative.   HENT: Negative.   Eyes: Negative.   Respiratory: Negative.   Cardiovascular: Negative.   Gastrointestinal: Positive for diarrhea.  Endocrine: Negative.   Genitourinary: Negative.   Musculoskeletal: Negative.   Skin: Negative.   Allergic/Immunologic: Positive for environmental allergies.  Neurological: Negative.   Hematological: Negative.   Psychiatric/Behavioral: Negative.    Medications:   Allergies as of 09/27/2019      Reactions   Adhesive [tape] Other (See Comments)   Removes skin   Allopurinol Swelling, Hives, Itching   Codeine Nausea Only   Latex       Medication List        Accurate as of September 27, 2019 12:42 PM. If you have any questions, ask your nurse or doctor.        STOP taking these medications   allopurinol 100 MG tablet Commonly known as: ZYLOPRIM Stopped by: Chesley Mires, MD   Colchicine 0.6 MG Caps Stopped by: Chesley Mires, MD   doxycycline 50 MG capsule Commonly known as: VIBRAMYCIN Stopped by: Chesley Mires, MD   famotidine 20 MG tablet Commonly known as: PEPCID Stopped by: Chesley Mires, MD   magnesium oxide 400 MG tablet Commonly known as: MAG-OX Stopped by: Chesley Mires, MD   methocarbamol 500 MG tablet Commonly known as: Robaxin Stopped by: Chesley Mires, MD   ondansetron 4 MG tablet Commonly known as: Zofran Stopped by: Chesley Mires, MD   predniSONE 10 MG tablet Commonly known as: DELTASONE Stopped by: Chesley Mires, MD     TAKE these medications   ampicillin 500 MG capsule Commonly known as: PRINCIPEN Take 500 mg by mouth daily.   atorvastatin 40 MG tablet Commonly known as: LIPITOR Take 40 mg by mouth daily.   Breo Ellipta 200-25 MCG/INH Aepb Generic drug: fluticasone furoate-vilanterol Breo Ellipta 200 mcg-25 mcg/dose powder for inhalation   clonazePAM 0.5 MG tablet Commonly known as: KLONOPIN TK 1 T PO UP TO BID PRA OR MUSCLE SPASM   febuxostat 40 MG tablet Commonly known as: ULORIC Take 40 mg by mouth daily.   fenofibrate micronized 67 MG capsule Commonly known as: LOFIBRA Take 67 mg by mouth daily.   Fish Oil 1000 MG Caps Take 1,000 mg by mouth 2 (two) times daily.   hydrochlorothiazide 25 MG tablet Commonly known as: HYDRODIURIL Take 25 mg by mouth daily.   levothyroxine 112 MCG tablet Commonly known as: SYNTHROID Take 112 mcg by mouth daily before breakfast.   Magnesium 400 MG Caps Take 1 capsule by mouth daily.   montelukast 10 MG tablet Commonly known as: SINGULAIR Take 10 mg by mouth at bedtime.   olmesartan 40 MG tablet Commonly known as: BENICAR olmesartan 40 mg tablet   40 mg  by oral route.   Omeprazole 20 MG Tbdd Take 20 mg by mouth 2 (two) times daily.   ProAir HFA 108 (90 Base) MCG/ACT inhaler Generic drug: albuterol Inhale 2 puffs into the lungs every 6 (six) hours as needed for wheezing or shortness of breath.   valACYclovir 500 MG tablet Commonly known as: VALTREX Take 500 mg by mouth daily.   Vitamin D3 25 MCG (1000 UT) Caps Take 1 capsule by mouth daily.       Past Surgical History:  She  has a past surgical history that includes Thyroidectomy; Cesarean section (1975); Tonsillectomy; Colonoscopy; Polypectomy; Reverse shoulder arthroplasty (Right, 09/01/2018); Shoulder arthroscopy w/ rotator cuff repair (Bilateral); and Reverse shoulder arthroplasty (Right, 09/01/2018).  Family History:  Her family history includes Bone  cancer in her father; Celiac disease in her sister; Colon polyps in her brother and sister; Diabetes in her maternal grandmother; Gallstones in her mother; Heart attack in her maternal grandfather; Multiple myeloma in her father; Thyroid disease in her mother.  Social History:  She  reports that she has never smoked. She has never used smokeless tobacco. She reports that she does not drink alcohol or use drugs.

## 2019-09-27 NOTE — Patient Instructions (Signed)
Will arrange for chest xray, pulmonary function test, and overnight oxygen test  Follow up in 3 weeks

## 2019-09-27 NOTE — Progress Notes (Signed)
  Subjective:     Patient ID: Felicia Washington, female   DOB: 01-Sep-1946, 73 y.o.   MRN: [id number]  HPI   Review of Systems  Constitutional: Negative.   HENT: Negative.   Eyes: Negative.   Respiratory: Negative.   Cardiovascular: Negative.   Gastrointestinal: Positive for diarrhea.  Endocrine: Negative.   Genitourinary: Negative.   Musculoskeletal: Negative.   Skin: Negative.   Allergic/Immunologic: Positive for environmental allergies.  Neurological: Negative.   Hematological: Negative.   Psychiatric/Behavioral: Negative.        Objective:   Physical Exam     Assessment:         Plan:

## 2019-09-29 ENCOUNTER — Telehealth: Payer: Self-pay | Admitting: Pulmonary Disease

## 2019-09-30 DIAGNOSIS — J45909 Unspecified asthma, uncomplicated: Secondary | ICD-10-CM | POA: Diagnosis not present

## 2019-10-13 NOTE — Telephone Encounter (Signed)
CLINICAL DATA:  Bilateral foot edema for 6 weeks  EXAM: CHEST - 2 VIEW  COMPARISON:  01/24/2009 chest radiograph.  FINDINGS: Partially visualized right shoulder arthroplasty. Stable cardiomediastinal silhouette with normal heart size and large hiatal hernia. No pneumothorax. No pleural effusion. Lungs appear clear, with no acute consolidative airspace disease and no pulmonary edema.  IMPRESSION: No active cardiopulmonary disease.  Large hiatal hernia.   Electronically Signed   By: Ilona Sorrel M.D.   On: 09/28/2019 08:19   Please let her know her chest xray shows a hiatal hernia.  She should follow up with her PCP if she is having any GI related issues.  Nothing on CXR to explain why she would be having ankle swelling.

## 2019-10-13 NOTE — Telephone Encounter (Signed)
Yes.  Need to complete assessment of pulmonary status to exclude pulmonary cause for leg swelling.

## 2019-10-13 NOTE — Telephone Encounter (Signed)
Spoke with the pt and notified response  She verbalized understanding  Will keep appt for PFT   She asked about ONO results  I do not see that these are scanned in yet  She states done by [place] 09/29/19  I called [employer] and spoke with [place] and she is going to fax these  Will forward back to [place] to keep an eye out for these results

## 2019-10-13 NOTE — Telephone Encounter (Signed)
Called the patient and advised of the results noted below. Patient voiced understanding.   Dr. Halford Chessman, the patient asked if she still needs to have the PFT on 11/13/19?

## 2019-10-17 NOTE — Telephone Encounter (Signed)
Fax received. Placed in Dr. Juanetta Gosling look at folder for review.

## 2019-10-19 ENCOUNTER — Telehealth: Payer: Self-pay | Admitting: Pulmonary Disease

## 2019-10-19 NOTE — Telephone Encounter (Signed)
ONO with RA 10/20/19 >> test time 10 hrs 2 min.  Mean SpO2 94.5%, low SpO2 84%.  Spent 36 sec with SpO2 < 89%.   Please let her know that her overnight oxygen test was normal.

## 2019-10-19 NOTE — Telephone Encounter (Signed)
Called the patient and advised of results. Patient voiced understanding nothing further needed.

## 2019-11-10 ENCOUNTER — Other Ambulatory Visit (HOSPITAL_COMMUNITY): Payer: Medicare Other

## 2019-11-10 ENCOUNTER — Other Ambulatory Visit (HOSPITAL_COMMUNITY)
Admission: RE | Admit: 2019-11-10 | Discharge: 2019-11-10 | Disposition: A | Discharge status: Home / Self Care | Payer: Medicare Other | Source: Ambulatory Visit | Attending: Pulmonary Disease | Admitting: Pulmonary Disease

## 2019-11-10 DIAGNOSIS — Z01812 Encounter for preprocedural laboratory examination: Secondary | ICD-10-CM | POA: Insufficient documentation

## 2019-11-10 DIAGNOSIS — Z20828 Contact with and (suspected) exposure to other viral communicable diseases: Secondary | ICD-10-CM | POA: Diagnosis not present

## 2019-11-11 LAB — SARS CORONAVIRUS 2 (TAT 6-24 HRS): SARS Coronavirus 2: NEGATIVE

## 2019-11-13 ENCOUNTER — Ambulatory Visit (INDEPENDENT_AMBULATORY_CARE_PROVIDER_SITE_OTHER): Payer: Medicare Other | Admitting: Pulmonary Disease

## 2019-11-13 ENCOUNTER — Other Ambulatory Visit: Payer: Self-pay

## 2019-11-13 ENCOUNTER — Encounter: Payer: Self-pay | Admitting: Pulmonary Disease

## 2019-11-13 VITALS — BP 138/80 | HR 104 | Temp 97.6°F | Ht 65.5 in | Wt 146.0 lb

## 2019-11-13 DIAGNOSIS — R609 Edema, unspecified: Secondary | ICD-10-CM

## 2019-11-13 LAB — PULMONARY FUNCTION TEST
DL/VA % pred: 135 %
DL/VA: 5.52 ml/min/mmHg/L
DLCO unc % pred: 96 %
DLCO unc: 19.61 ml/min/mmHg
FEF 25-75 Post: 3.21 L/sec
FEF 25-75 Pre: 2.59 L/sec
FEF2575-%Change-Post: 23 %
FEF2575-%Pred-Post: 165 %
FEF2575-%Pred-Pre: 133 %
FEV1-%Change-Post: 4 %
FEV1-%Pred-Post: 78 %
FEV1-%Pred-Pre: 74 %
FEV1-Post: 1.86 L
FEV1-Pre: 1.78 L
FEV1FVC-%Change-Post: 4 %
FEV1FVC-%Pred-Pre: 115 %
FEV6-%Change-Post: 0 %
FEV6-%Pred-Post: 67 %
FEV6-%Pred-Pre: 67 %
FEV6-Post: 2.03 L
FEV6-Pre: 2.03 L
FEV6FVC-%Pred-Post: 104 %
FEV6FVC-%Pred-Pre: 104 %
FVC-%Change-Post: 0 %
FVC-%Pred-Post: 64 %
FVC-%Pred-Pre: 64 %
FVC-Post: 2.03 L
FVC-Pre: 2.03 L
Post FEV1/FVC ratio: 92 %
Post FEV6/FVC ratio: 100 %
Pre FEV1/FVC ratio: 88 %
Pre FEV6/FVC Ratio: 100 %
RV % pred: 93 %
RV: 2.12 L
TLC % pred: 78 %
TLC: 4.12 L

## 2019-11-13 NOTE — Progress Notes (Signed)
[facility]  Chief Complaint  Patient presents with  . Follow-up    PFT    Constitutional:  BP 138/80 (BP Location: Right Arm, Cuff Size: Normal)   Pulse (!) 104   Temp 97.6 F (36.4 C) (Temporal)   Ht 5' 5.5" (1.664 m)   Wt 146 lb (66.2 kg)   SpO2 96%   BMI 23.93 kg/m   Past Medical History:  Allergies, Asthma, PNA, Migraine HA, HTN, Graves disease, Hypothyroidism, HLD, Gout, GERD, Celiac disease, OA, Anemia  Brief Summary:  Felicia Washington is a 73 y.o. female with ankle edema.  Since her last visit she has overnight oximetry - this was normal.  She had PFT today - this showed very mild restrictive defect, but not diffusion defect.  Likely related to history of hiatal hernia.  She feels her feet/ankle swelling is related to issues with her veins.  Physical Exam:   Appearance - well kempt   ENMT - clear nasal mucosa, midline nasal  septum, no oral exudates, no LAN, trachea midline  Respiratory - normal chest wall, normal respiratory effort, no accessory muscle use, no wheeze/rales  CV - s1s2 regular rate and rhythm, no murmurs, radial pulses symmetric  GI - soft, non tender, no masses  Lymph - no adenopathy noted in neck and axillary areas  MSK - mild pitting edema in ankles b/l  Ext - no cyanosis, clubbing, or joint inflammation noted  Skin - no rashes, lesions, or ulcers  Neuro - normal strength, oriented x 3  Psych - normal mood and affect   Assessment/Plan:   Feet/ankle edema. - no pulmonary explanation for this finding - advised her to f/u with PCP to determine if she needs further assessment with cardiology and/or vascular surgery - she can return to pulmonary as needed if she develops respiratory symptoms   Patient Instructions  Follow up with pulmonary medicine as needed if you develop new respiratory symptoms    Chesley Mires, MD [place] Pager: [phone] 11/13/2019, 10:37  AM  Flow Sheet     Pulmonary tests:  PFT 11/13/19 >> FEV1 1.86 (78%), FEV1% 92, TLC 4.12 (78%), DLCO 96%, no BD  Sleep tests:  ONO with RA 10/20/19 >> test time 10 hrs 2 min.  Mean SpO2 94.5%, low SpO2 84%.  Spent 36 sec with SpO2 < 89%.  Cardiac tests:  Echo 09/08/19 >> EF greater than 65%, grade 2 DD, normal RV function  Medications:   Allergies as of 11/13/2019      Reactions   Adhesive [tape] Other (See Comments)   Removes skin   Allopurinol Swelling, Hives, Itching   Codeine Nausea Only   Latex       Medication List       Accurate as of November 13, 2019 10:37 AM. If you have any questions, ask your nurse or doctor.        STOP taking these medications   Fish Oil 1000 MG Caps Stopped by: Chesley Mires, MD     TAKE these medications   ampicillin 500 MG capsule Commonly known as: PRINCIPEN Take 500 mg by mouth daily.   atorvastatin 40 MG tablet Commonly known as: LIPITOR Take 40 mg by mouth daily.   Breo Ellipta 200-25 MCG/INH Aepb Generic drug: fluticasone furoate-vilanterol Breo Ellipta 200 mcg-25 mcg/dose powder for inhalation   clonazePAM 0.5 MG tablet Commonly known as: KLONOPIN TK 1 T PO UP TO BID PRA OR MUSCLE SPASM  febuxostat 40 MG tablet Commonly known as: ULORIC Take 40 mg by mouth daily.   fenofibrate micronized 67 MG capsule Commonly known as: LOFIBRA Take 67 mg by mouth daily.   hydrochlorothiazide 25 MG tablet Commonly known as: HYDRODIURIL Take 25 mg by mouth daily.   levothyroxine 112 MCG tablet Commonly known as: SYNTHROID Take 112 mcg by mouth daily before breakfast.   Magnesium 400 MG Caps Take 1 capsule by mouth daily.   montelukast 10 MG tablet Commonly known as: SINGULAIR Take 10 mg by mouth at bedtime.   olmesartan 40 MG tablet Commonly known as: BENICAR olmesartan 40 mg tablet   40 mg by oral route.   Omeprazole 20 MG Tbdd Take 20 mg by mouth 2 (two) times daily.   ProAir HFA 108 (90 Base) MCG/ACT inhaler  Generic drug: albuterol Inhale 2 puffs into the lungs every 6 (six) hours as needed for wheezing or shortness of breath.   valACYclovir 500 MG tablet Commonly known as: VALTREX Take 500 mg by mouth daily.   Vitamin D3 25 MCG (1000 UT) Caps Take 1 capsule by mouth daily.       Past Surgical History:  She  has a past surgical history that includes Thyroidectomy; Cesarean section (1975); Tonsillectomy; Colonoscopy; Polypectomy; Reverse shoulder arthroplasty (Right, 09/01/2018); Shoulder arthroscopy w/ rotator cuff repair (Bilateral); and Reverse shoulder arthroplasty (Right, 09/01/2018).  Family History:  Her family history includes Bone cancer in her father; Celiac disease in her sister; Colon polyps in her brother and sister; Diabetes in her maternal grandmother; Gallstones in her mother; Heart attack in her maternal grandfather; Multiple myeloma in her father; Thyroid disease in her mother.  Social History:  She  reports that she has never smoked. She has never used smokeless tobacco. She reports that she does not drink alcohol or use drugs.

## 2019-11-13 NOTE — Patient Instructions (Signed)
Follow up with pulmonary medicine as needed if you develop new respiratory symptoms

## 2019-11-13 NOTE — Progress Notes (Signed)
[place] PFT performed today. 

## 2020-01-15 DIAGNOSIS — I8310 Varicose veins of unspecified lower extremity with inflammation: Secondary | ICD-10-CM | POA: Insufficient documentation

## 2020-01-23 ENCOUNTER — Ambulatory Visit: Payer: Medicare Other

## 2020-02-02 ENCOUNTER — Ambulatory Visit: Payer: Medicare Other

## 2020-02-13 ENCOUNTER — Ambulatory Visit: Payer: Medicare Other

## 2020-02-22 ENCOUNTER — Ambulatory Visit (HOSPITAL_COMMUNITY)
Admission: RE | Admit: 2020-02-22 | Discharge: 2020-02-22 | Disposition: A | Discharge status: Home / Self Care | Payer: Medicare Other | Source: Ambulatory Visit | Attending: Vascular Surgery | Admitting: Vascular Surgery

## 2020-02-22 ENCOUNTER — Encounter: Payer: Self-pay | Admitting: Vascular Surgery

## 2020-02-22 ENCOUNTER — Other Ambulatory Visit: Payer: Self-pay

## 2020-02-22 ENCOUNTER — Ambulatory Visit (INDEPENDENT_AMBULATORY_CARE_PROVIDER_SITE_OTHER): Payer: Medicare Other | Admitting: Physician Assistant

## 2020-02-22 DIAGNOSIS — I8393 Asymptomatic varicose veins of bilateral lower extremities: Secondary | ICD-10-CM

## 2020-02-22 DIAGNOSIS — I83893 Varicose veins of bilateral lower extremities with other complications: Secondary | ICD-10-CM | POA: Insufficient documentation

## 2020-02-22 NOTE — Progress Notes (Signed)
Requested by:  Reynold Bowen, [place],  [place] [address]  Reason for consultation: spider veins    History of Present Illness   Felicia Washington is a 74 y.o. (1946/05/31) female who presents with chief complaint: spider veins.   Patient states spider veins have become more noticeable over the past several months.  She denies any significant edema bilateral lower extremities.  She denies any history of DVT or venous ulcerations.  She had a bleeding episode from a spider vein left medial ankle in the past while shaving in the shower however this has not happened since then.  She is not worried about the cosmesis of her veins she just wanted reassurance that there is not a significant underlying problem.  She denies tobacco use.  Past Medical History:  Diagnosis Date  . Anemia   . Arthritis    "fingers, shoulders" (09/01/2018)  . Asthma   . Celiac disease   . GERD (gastroesophageal reflux disease)   . Graves disease   . Heart murmur   . History of blood transfusion    "related to c-section"  . History of gout    fingers  . HLD (hyperlipidemia)   . HTN (hypertension)   . Hypothyroidism   . Migraine    "1 q couple years" (09/01/2018)  . Pneumonia    "several times in 1 yr" (09/01/2018)  . PONV (postoperative nausea and vomiting)   . Seasonal allergies     Past Surgical History:  Procedure Laterality Date  . CESAREAN SECTION  1975  . COLONOSCOPY    . POLYPECTOMY    . REVERSE SHOULDER ARTHROPLASTY Right 09/01/2018  . REVERSE SHOULDER ARTHROPLASTY Right 09/01/2018   Procedure: RIGHT REVERSE SHOULDER ARTHROPLASTY;  Surgeon: Justice Britain, MD;  Location: [facility];  Service: Orthopedics;  Laterality: Right;  . SHOULDER ARTHROSCOPY W/ ROTATOR CUFF REPAIR Bilateral   . THYROIDECTOMY    . TONSILLECTOMY      Social History   Socioeconomic History  . Marital status: Widowed    Spouse name: Not on file  . Number of children: 3  . Years of education: Not on file  .  Highest education level: Not on file  Occupational History  . Occupation: Press photographer  Tobacco Use  . Smoking status: Never Smoker  . Smokeless tobacco: Never Used  Substance and Sexual Activity  . Alcohol use: No    Alcohol/week: 0.0 standard drinks  . Drug use: No  . Sexual activity: Not on file  Other Topics Concern  . Not on file  Social History Narrative  . Not on file   Social Determinants of Health   Financial Resource Strain:   . Difficulty of Paying Living Expenses: Not on file  Food Insecurity:   . Worried About Charity fundraiser in the Last Year: Not on file  . Ran Out of Food in the Last Year: Not on file  Transportation Needs:   . Lack of Transportation (Medical): Not on file  . Lack of Transportation (Non-Medical): Not on file  Physical Activity:   . Days of Exercise per Week: Not on file  . Minutes of Exercise per Session: Not on file  Stress:   . Feeling of Stress : Not on file  Social Connections:   . Frequency of Communication with Friends and Family: Not on file  . Frequency of Social Gatherings with Friends and Family: Not on file  . Attends Religious Services: Not on file  .  Active Member of Clubs or Organizations: Not on file  . Attends Archivist Meetings: Not on file  . Marital Status: Not on file  Intimate Partner Violence:   . Fear of Current or Ex-Partner: Not on file  . Emotionally Abused: Not on file  . Physically Abused: Not on file  . Sexually Abused: Not on file    Family History  Problem Relation Age of Onset  . Multiple myeloma Father   . Bone cancer Father   . Celiac disease Sister   . Colon polyps Sister   . Thyroid disease Mother   . Gallstones Mother   . Heart attack Maternal Grandfather   . Colon polyps Brother   . Diabetes Maternal Grandmother     Current Outpatient Medications  Medication Sig Dispense Refill  . albuterol (PROAIR HFA) 108 (90 Base) MCG/ACT inhaler Inhale 2 puffs into the lungs every 6 (six)  hours as needed for wheezing or shortness of breath.    Marland Kitchen ampicillin (PRINCIPEN) 500 MG capsule Take 500 mg by mouth daily.   5  . atorvastatin (LIPITOR) 40 MG tablet Take 40 mg by mouth daily.    . Cholecalciferol (VITAMIN D3) 25 MCG (1000 UT) CAPS Take 1 capsule by mouth daily.    . clonazePAM (KLONOPIN) 0.5 MG tablet TK 1 T PO UP TO BID PRA OR MUSCLE SPASM    . febuxostat (ULORIC) 40 MG tablet Take 40 mg by mouth daily.    . fenofibrate micronized (LOFIBRA) 67 MG capsule Take 67 mg by mouth daily.  5  . fluticasone furoate-vilanterol (BREO ELLIPTA) 200-25 MCG/INH AEPB Breo Ellipta 200 mcg-25 mcg/dose powder for inhalation    . hydrochlorothiazide (HYDRODIURIL) 25 MG tablet Take 25 mg by mouth daily.     Marland Kitchen levothyroxine (SYNTHROID, LEVOTHROID) 112 MCG tablet Take 112 mcg by mouth daily before breakfast.     . Magnesium 400 MG CAPS Take 1 capsule by mouth daily.    . montelukast (SINGULAIR) 10 MG tablet Take 10 mg by mouth at bedtime.    Marland Kitchen olmesartan (BENICAR) 40 MG tablet olmesartan 40 mg tablet   40 mg by oral route.    . Omeprazole 20 MG TBDD Take 20 mg by mouth 2 (two) times daily.     . valACYclovir (VALTREX) 500 MG tablet Take 500 mg by mouth daily.     No current facility-administered medications for this visit.    Allergies  Allergen Reactions  . Adhesive [Tape] Other (See Comments)    Removes skin  . Allopurinol Swelling, Hives and Itching  . Codeine Nausea Only  . Latex     REVIEW OF SYSTEMS (negative unless checked):   Cardiac:  []  Chest pain or chest pressure? []  Shortness of breath upon activity? []  Shortness of breath when lying flat? []  Irregular heart rhythm?  Vascular:  []  Pain in calf, thigh, or hip brought on by walking? []  Pain in feet at night that wakes you up from your sleep? []  Blood clot in your veins? []  Leg swelling?  Pulmonary:  []  Oxygen at home? []  Productive cough? []  Wheezing?  Neurologic:  []  Sudden weakness in arms or legs? []   Sudden numbness in arms or legs? []  Sudden onset of difficult speaking or slurred speech? []  Temporary loss of vision in one eye? []  Problems with dizziness?  Gastrointestinal:  []  Blood in stool? []  Vomited blood?  Genitourinary:  []  Burning when urinating? []  Blood in urine?  Psychiatric:  []  Major depression  Hematologic:  []  Bleeding problems? []  Problems with blood clotting?  Dermatologic:  []  Rashes or ulcers?  Constitutional:  []  Fever or chills?  Ear/Nose/Throat:  []  Change in hearing? []  Nose bleeds? []  Sore throat?  Musculoskeletal:  []  Back pain? [x]  Joint pain? []  Muscle pain?   Physical Examination     Vitals:   02/22/20 1455  BP: (!) 147/89  Pulse: 87  Resp: 16  Temp: (!) 97.3 F (36.3 C)  TempSrc: Temporal  SpO2: 99%  Weight: 136 lb (61.7 kg)  Height: 5' 5.5" (1.664 m)   Body mass index is 22.29 kg/m.  General Alert, O x 3, WD, NAD  Head [place]/AT,    Neck Supple, mid-line trachea,    Pulmonary Sym exp, good B air movt  Cardiac RRR, Nl S1, S2,   Vascular Vessel Right Left  Radial Palpable Palpable  PT Faintly palpable Faintly palpable    Gastro- intestinal soft, non-distended, non-tender to palpation,   Musculo- skeletal M/S 5/5 throughout  , Extremities without ischemic changes  , No edema present, No visible varicosities , No ropey varicosities noted however there are numerous spider veins especially around the ankles of both legs  Neurologic Cranial nerves 2-12 intact , Pain and light touch intact in extremities , Motor exam as listed above  Psychiatric Judgement intact, Mood & affect appropriate for pt's clinical situation  Dermatologic See M/S exam for extremity exam, No rashes otherwise noted  Lymphatic  Palpable lymph nodes: None    Non-invasive Vascular Imaging   BLE Venous Insufficiency Duplex :   RLE:   no DVT and SVT,   Distal thigh GSV reflux,   no SSV reflux,  no deep venous reflux  LLE:  no DVT and SVT,    Proximal calf GSV reflux,   no SSV reflux,  no deep venous reflux   Medical Decision Making   JACKIE RUSSMAN is a 74 y.o. female who presents with spider veins and evaluation for venous reflux   Duplex negative for DVT bilateral lower extremities  Patient does have some reflux of greater saphenous vein of right lower extremity at distal thigh and left lower extremity at proximal calf however no other reflux is noted and greater saphenous vein is very small bilaterally  Patient is not a candidate for laser ablation  Patient has no interest in sclerotherapy  Nothing to offer from a vascular surgery standpoint  She may follow-up on an as-needed basis   Dagoberto Ligas, PA-C [facility] Office: [phone]  02/22/2020, 3:23 PM  Clinic MD: Oneida Alar

## 2020-03-25 DIAGNOSIS — M1A9XX1 Chronic gout, unspecified, with tophus (tophi): Secondary | ICD-10-CM | POA: Diagnosis not present

## 2020-03-25 DIAGNOSIS — Z6823 Body mass index (BMI) 23.0-23.9, adult: Secondary | ICD-10-CM | POA: Diagnosis not present

## 2020-03-25 DIAGNOSIS — M150 Primary generalized (osteo)arthritis: Secondary | ICD-10-CM | POA: Diagnosis not present

## 2020-03-25 DIAGNOSIS — M2550 Pain in unspecified joint: Secondary | ICD-10-CM | POA: Diagnosis not present

## 2020-03-25 DIAGNOSIS — R768 Other specified abnormal immunological findings in serum: Secondary | ICD-10-CM | POA: Diagnosis not present

## 2020-04-17 DIAGNOSIS — Z1231 Encounter for screening mammogram for malignant neoplasm of breast: Secondary | ICD-10-CM | POA: Diagnosis not present

## 2020-04-23 DIAGNOSIS — N6312 Unspecified lump in the right breast, upper inner quadrant: Secondary | ICD-10-CM | POA: Diagnosis not present

## 2020-04-23 DIAGNOSIS — R922 Inconclusive mammogram: Secondary | ICD-10-CM | POA: Diagnosis not present

## 2020-04-23 DIAGNOSIS — N6314 Unspecified lump in the right breast, lower inner quadrant: Secondary | ICD-10-CM | POA: Diagnosis not present

## 2020-04-29 ENCOUNTER — Other Ambulatory Visit: Payer: Self-pay | Admitting: Radiology

## 2020-04-29 DIAGNOSIS — D241 Benign neoplasm of right breast: Secondary | ICD-10-CM | POA: Diagnosis not present

## 2020-04-29 DIAGNOSIS — R928 Other abnormal and inconclusive findings on diagnostic imaging of breast: Secondary | ICD-10-CM | POA: Diagnosis not present

## 2020-04-29 DIAGNOSIS — D242 Benign neoplasm of left breast: Secondary | ICD-10-CM | POA: Diagnosis not present

## 2020-07-11 DIAGNOSIS — N183 Chronic kidney disease, stage 3 unspecified: Secondary | ICD-10-CM | POA: Diagnosis not present

## 2020-07-15 DIAGNOSIS — M109 Gout, unspecified: Secondary | ICD-10-CM | POA: Diagnosis not present

## 2020-07-15 DIAGNOSIS — N182 Chronic kidney disease, stage 2 (mild): Secondary | ICD-10-CM | POA: Diagnosis not present

## 2020-07-15 DIAGNOSIS — E559 Vitamin D deficiency, unspecified: Secondary | ICD-10-CM | POA: Diagnosis not present

## 2020-07-15 DIAGNOSIS — I129 Hypertensive chronic kidney disease with stage 1 through stage 4 chronic kidney disease, or unspecified chronic kidney disease: Secondary | ICD-10-CM | POA: Diagnosis not present

## 2020-09-19 DIAGNOSIS — M109 Gout, unspecified: Secondary | ICD-10-CM | POA: Diagnosis not present

## 2020-09-19 DIAGNOSIS — D126 Benign neoplasm of colon, unspecified: Secondary | ICD-10-CM | POA: Diagnosis not present

## 2020-09-19 DIAGNOSIS — G43009 Migraine without aura, not intractable, without status migrainosus: Secondary | ICD-10-CM | POA: Diagnosis not present

## 2020-09-19 DIAGNOSIS — K219 Gastro-esophageal reflux disease without esophagitis: Secondary | ICD-10-CM | POA: Diagnosis not present

## 2020-09-19 DIAGNOSIS — Z23 Encounter for immunization: Secondary | ICD-10-CM | POA: Diagnosis not present

## 2020-09-19 DIAGNOSIS — I10 Essential (primary) hypertension: Secondary | ICD-10-CM | POA: Diagnosis not present

## 2020-09-19 DIAGNOSIS — R945 Abnormal results of liver function studies: Secondary | ICD-10-CM | POA: Diagnosis not present

## 2020-09-19 DIAGNOSIS — I8310 Varicose veins of unspecified lower extremity with inflammation: Secondary | ICD-10-CM | POA: Diagnosis not present

## 2020-09-19 DIAGNOSIS — E785 Hyperlipidemia, unspecified: Secondary | ICD-10-CM | POA: Diagnosis not present

## 2020-09-19 DIAGNOSIS — J45909 Unspecified asthma, uncomplicated: Secondary | ICD-10-CM | POA: Diagnosis not present

## 2020-09-19 DIAGNOSIS — E890 Postprocedural hypothyroidism: Secondary | ICD-10-CM | POA: Diagnosis not present

## 2020-09-19 DIAGNOSIS — K900 Celiac disease: Secondary | ICD-10-CM | POA: Diagnosis not present

## 2020-09-25 DIAGNOSIS — Z6824 Body mass index (BMI) 24.0-24.9, adult: Secondary | ICD-10-CM | POA: Diagnosis not present

## 2020-09-25 DIAGNOSIS — M1A9XX1 Chronic gout, unspecified, with tophus (tophi): Secondary | ICD-10-CM | POA: Diagnosis not present

## 2020-09-25 DIAGNOSIS — R768 Other specified abnormal immunological findings in serum: Secondary | ICD-10-CM | POA: Diagnosis not present

## 2020-09-25 DIAGNOSIS — M2550 Pain in unspecified joint: Secondary | ICD-10-CM | POA: Diagnosis not present

## 2020-09-25 DIAGNOSIS — Z79899 Other long term (current) drug therapy: Secondary | ICD-10-CM | POA: Diagnosis not present

## 2020-09-25 DIAGNOSIS — M150 Primary generalized (osteo)arthritis: Secondary | ICD-10-CM | POA: Diagnosis not present

## 2020-11-02 DIAGNOSIS — Z23 Encounter for immunization: Secondary | ICD-10-CM | POA: Diagnosis not present

## 2020-12-04 DIAGNOSIS — H9201 Otalgia, right ear: Secondary | ICD-10-CM | POA: Diagnosis not present

## 2020-12-04 DIAGNOSIS — H60501 Unspecified acute noninfective otitis externa, right ear: Secondary | ICD-10-CM | POA: Diagnosis not present

## 2020-12-04 DIAGNOSIS — H6690 Otitis media, unspecified, unspecified ear: Secondary | ICD-10-CM | POA: Diagnosis not present

## 2021-03-25 DIAGNOSIS — R768 Other specified abnormal immunological findings in serum: Secondary | ICD-10-CM | POA: Diagnosis not present

## 2021-03-25 DIAGNOSIS — Z6824 Body mass index (BMI) 24.0-24.9, adult: Secondary | ICD-10-CM | POA: Diagnosis not present

## 2021-03-25 DIAGNOSIS — M2550 Pain in unspecified joint: Secondary | ICD-10-CM | POA: Diagnosis not present

## 2021-03-25 DIAGNOSIS — Z79899 Other long term (current) drug therapy: Secondary | ICD-10-CM | POA: Diagnosis not present

## 2021-03-25 DIAGNOSIS — M150 Primary generalized (osteo)arthritis: Secondary | ICD-10-CM | POA: Diagnosis not present

## 2021-03-25 DIAGNOSIS — M1A9XX1 Chronic gout, unspecified, with tophus (tophi): Secondary | ICD-10-CM | POA: Diagnosis not present

## 2021-04-01 DIAGNOSIS — K219 Gastro-esophageal reflux disease without esophagitis: Secondary | ICD-10-CM | POA: Diagnosis not present

## 2021-04-01 DIAGNOSIS — R945 Abnormal results of liver function studies: Secondary | ICD-10-CM | POA: Diagnosis not present

## 2021-04-01 DIAGNOSIS — J45909 Unspecified asthma, uncomplicated: Secondary | ICD-10-CM | POA: Diagnosis not present

## 2021-04-01 DIAGNOSIS — I129 Hypertensive chronic kidney disease with stage 1 through stage 4 chronic kidney disease, or unspecified chronic kidney disease: Secondary | ICD-10-CM | POA: Diagnosis not present

## 2021-04-01 DIAGNOSIS — E890 Postprocedural hypothyroidism: Secondary | ICD-10-CM | POA: Diagnosis not present

## 2021-04-01 DIAGNOSIS — I5189 Other ill-defined heart diseases: Secondary | ICD-10-CM | POA: Diagnosis not present

## 2021-04-01 DIAGNOSIS — I8310 Varicose veins of unspecified lower extremity with inflammation: Secondary | ICD-10-CM | POA: Diagnosis not present

## 2021-04-01 DIAGNOSIS — E785 Hyperlipidemia, unspecified: Secondary | ICD-10-CM | POA: Diagnosis not present

## 2021-04-01 DIAGNOSIS — K900 Celiac disease: Secondary | ICD-10-CM | POA: Diagnosis not present

## 2021-04-01 DIAGNOSIS — G43009 Migraine without aura, not intractable, without status migrainosus: Secondary | ICD-10-CM | POA: Diagnosis not present

## 2021-04-01 DIAGNOSIS — D126 Benign neoplasm of colon, unspecified: Secondary | ICD-10-CM | POA: Diagnosis not present

## 2021-04-01 DIAGNOSIS — M109 Gout, unspecified: Secondary | ICD-10-CM | POA: Diagnosis not present

## 2021-04-30 DIAGNOSIS — R921 Mammographic calcification found on diagnostic imaging of breast: Secondary | ICD-10-CM | POA: Diagnosis not present

## 2021-05-19 ENCOUNTER — Other Ambulatory Visit: Payer: Self-pay | Admitting: Radiology

## 2021-05-19 DIAGNOSIS — R921 Mammographic calcification found on diagnostic imaging of breast: Secondary | ICD-10-CM | POA: Diagnosis not present

## 2021-05-19 DIAGNOSIS — N6011 Diffuse cystic mastopathy of right breast: Secondary | ICD-10-CM | POA: Diagnosis not present

## 2021-09-08 DIAGNOSIS — Z23 Encounter for immunization: Secondary | ICD-10-CM | POA: Diagnosis not present

## 2021-09-18 DIAGNOSIS — I5189 Other ill-defined heart diseases: Secondary | ICD-10-CM | POA: Diagnosis not present

## 2021-09-18 DIAGNOSIS — K900 Celiac disease: Secondary | ICD-10-CM | POA: Diagnosis not present

## 2021-09-18 DIAGNOSIS — D126 Benign neoplasm of colon, unspecified: Secondary | ICD-10-CM | POA: Diagnosis not present

## 2021-09-18 DIAGNOSIS — E890 Postprocedural hypothyroidism: Secondary | ICD-10-CM | POA: Diagnosis not present

## 2021-09-18 DIAGNOSIS — E785 Hyperlipidemia, unspecified: Secondary | ICD-10-CM | POA: Diagnosis not present

## 2021-09-18 DIAGNOSIS — I8310 Varicose veins of unspecified lower extremity with inflammation: Secondary | ICD-10-CM | POA: Diagnosis not present

## 2021-09-18 DIAGNOSIS — I10 Essential (primary) hypertension: Secondary | ICD-10-CM | POA: Diagnosis not present

## 2021-09-18 DIAGNOSIS — M109 Gout, unspecified: Secondary | ICD-10-CM | POA: Diagnosis not present

## 2021-09-25 DIAGNOSIS — R600 Localized edema: Secondary | ICD-10-CM | POA: Diagnosis not present

## 2021-09-25 DIAGNOSIS — M1A9XX1 Chronic gout, unspecified, with tophus (tophi): Secondary | ICD-10-CM | POA: Diagnosis not present

## 2021-09-25 DIAGNOSIS — R768 Other specified abnormal immunological findings in serum: Secondary | ICD-10-CM | POA: Diagnosis not present

## 2021-09-25 DIAGNOSIS — Z6823 Body mass index (BMI) 23.0-23.9, adult: Secondary | ICD-10-CM | POA: Diagnosis not present

## 2021-09-25 DIAGNOSIS — M150 Primary generalized (osteo)arthritis: Secondary | ICD-10-CM | POA: Diagnosis not present

## 2021-09-25 DIAGNOSIS — Z79899 Other long term (current) drug therapy: Secondary | ICD-10-CM | POA: Diagnosis not present

## 2021-09-25 DIAGNOSIS — M2550 Pain in unspecified joint: Secondary | ICD-10-CM | POA: Diagnosis not present

## 2021-10-04 DIAGNOSIS — Z23 Encounter for immunization: Secondary | ICD-10-CM | POA: Diagnosis not present

## 2021-11-24 DIAGNOSIS — I129 Hypertensive chronic kidney disease with stage 1 through stage 4 chronic kidney disease, or unspecified chronic kidney disease: Secondary | ICD-10-CM | POA: Diagnosis not present

## 2021-11-24 DIAGNOSIS — N182 Chronic kidney disease, stage 2 (mild): Secondary | ICD-10-CM | POA: Diagnosis not present

## 2021-11-24 DIAGNOSIS — E559 Vitamin D deficiency, unspecified: Secondary | ICD-10-CM | POA: Diagnosis not present

## 2021-11-24 DIAGNOSIS — N390 Urinary tract infection, site not specified: Secondary | ICD-10-CM | POA: Diagnosis not present

## 2021-12-18 DIAGNOSIS — N182 Chronic kidney disease, stage 2 (mild): Secondary | ICD-10-CM | POA: Diagnosis not present

## 2021-12-25 ENCOUNTER — Other Ambulatory Visit: Payer: Self-pay | Admitting: Nephrology

## 2021-12-25 DIAGNOSIS — N182 Chronic kidney disease, stage 2 (mild): Secondary | ICD-10-CM

## 2021-12-26 ENCOUNTER — Ambulatory Visit
Admission: RE | Admit: 2021-12-26 | Discharge: 2021-12-26 | Disposition: A | Discharge status: Home / Self Care | Payer: Medicare Other | Source: Ambulatory Visit | Attending: Nephrology | Admitting: Nephrology

## 2021-12-26 DIAGNOSIS — N182 Chronic kidney disease, stage 2 (mild): Secondary | ICD-10-CM

## 2021-12-26 DIAGNOSIS — N281 Cyst of kidney, acquired: Secondary | ICD-10-CM | POA: Diagnosis not present

## 2022-01-01 ENCOUNTER — Inpatient Hospital Stay (HOSPITAL_COMMUNITY)
Admission: EM | Admit: 2022-01-01 | Discharge: 2022-01-03 | DRG: 064 | Disposition: A | Discharge status: Home / Self Care | Payer: Medicare Other | Attending: Family Medicine | Admitting: Family Medicine

## 2022-01-01 ENCOUNTER — Encounter (HOSPITAL_COMMUNITY): Payer: Self-pay | Admitting: Internal Medicine

## 2022-01-01 ENCOUNTER — Emergency Department (HOSPITAL_COMMUNITY): Payer: Medicare Other

## 2022-01-01 ENCOUNTER — Other Ambulatory Visit: Payer: Self-pay

## 2022-01-01 DIAGNOSIS — I63413 Cerebral infarction due to embolism of bilateral middle cerebral arteries: Secondary | ICD-10-CM | POA: Diagnosis not present

## 2022-01-01 DIAGNOSIS — Z888 Allergy status to other drugs, medicaments and biological substances status: Secondary | ICD-10-CM | POA: Diagnosis not present

## 2022-01-01 DIAGNOSIS — Z8379 Family history of other diseases of the digestive system: Secondary | ICD-10-CM | POA: Diagnosis not present

## 2022-01-01 DIAGNOSIS — Z20822 Contact with and (suspected) exposure to covid-19: Secondary | ICD-10-CM | POA: Diagnosis not present

## 2022-01-01 DIAGNOSIS — G319 Degenerative disease of nervous system, unspecified: Secondary | ICD-10-CM | POA: Diagnosis not present

## 2022-01-01 DIAGNOSIS — K219 Gastro-esophageal reflux disease without esophagitis: Secondary | ICD-10-CM | POA: Diagnosis present

## 2022-01-01 DIAGNOSIS — E876 Hypokalemia: Secondary | ICD-10-CM | POA: Diagnosis present

## 2022-01-01 DIAGNOSIS — I10 Essential (primary) hypertension: Secondary | ICD-10-CM | POA: Diagnosis present

## 2022-01-01 DIAGNOSIS — Z8349 Family history of other endocrine, nutritional and metabolic diseases: Secondary | ICD-10-CM

## 2022-01-01 DIAGNOSIS — I639 Cerebral infarction, unspecified: Secondary | ICD-10-CM | POA: Diagnosis not present

## 2022-01-01 DIAGNOSIS — E039 Hypothyroidism, unspecified: Secondary | ICD-10-CM | POA: Diagnosis not present

## 2022-01-01 DIAGNOSIS — Z91040 Latex allergy status: Secondary | ICD-10-CM

## 2022-01-01 DIAGNOSIS — E7800 Pure hypercholesterolemia, unspecified: Secondary | ICD-10-CM | POA: Diagnosis not present

## 2022-01-01 DIAGNOSIS — R4781 Slurred speech: Secondary | ICD-10-CM | POA: Diagnosis not present

## 2022-01-01 DIAGNOSIS — J45909 Unspecified asthma, uncomplicated: Secondary | ICD-10-CM | POA: Diagnosis present

## 2022-01-01 DIAGNOSIS — J4520 Mild intermittent asthma, uncomplicated: Secondary | ICD-10-CM | POA: Diagnosis present

## 2022-01-01 DIAGNOSIS — R270 Ataxia, unspecified: Secondary | ICD-10-CM | POA: Diagnosis present

## 2022-01-01 DIAGNOSIS — Z885 Allergy status to narcotic agent status: Secondary | ICD-10-CM | POA: Diagnosis not present

## 2022-01-01 DIAGNOSIS — R29818 Other symptoms and signs involving the nervous system: Secondary | ICD-10-CM | POA: Diagnosis not present

## 2022-01-01 DIAGNOSIS — Z96611 Presence of right artificial shoulder joint: Secondary | ICD-10-CM | POA: Diagnosis not present

## 2022-01-01 DIAGNOSIS — I6389 Other cerebral infarction: Secondary | ICD-10-CM | POA: Diagnosis not present

## 2022-01-01 DIAGNOSIS — Z743 Need for continuous supervision: Secondary | ICD-10-CM | POA: Diagnosis not present

## 2022-01-01 DIAGNOSIS — Z823 Family history of stroke: Secondary | ICD-10-CM

## 2022-01-01 DIAGNOSIS — G9341 Metabolic encephalopathy: Secondary | ICD-10-CM | POA: Diagnosis not present

## 2022-01-01 DIAGNOSIS — I517 Cardiomegaly: Secondary | ICD-10-CM | POA: Diagnosis present

## 2022-01-01 DIAGNOSIS — R29700 NIHSS score 0: Secondary | ICD-10-CM | POA: Diagnosis present

## 2022-01-01 DIAGNOSIS — Z8371 Family history of colonic polyps: Secondary | ICD-10-CM | POA: Diagnosis not present

## 2022-01-01 DIAGNOSIS — Z833 Family history of diabetes mellitus: Secondary | ICD-10-CM | POA: Diagnosis not present

## 2022-01-01 DIAGNOSIS — K449 Diaphragmatic hernia without obstruction or gangrene: Secondary | ICD-10-CM | POA: Diagnosis not present

## 2022-01-01 DIAGNOSIS — N390 Urinary tract infection, site not specified: Secondary | ICD-10-CM | POA: Diagnosis not present

## 2022-01-01 DIAGNOSIS — Z8249 Family history of ischemic heart disease and other diseases of the circulatory system: Secondary | ICD-10-CM | POA: Diagnosis not present

## 2022-01-01 DIAGNOSIS — G4489 Other headache syndrome: Secondary | ICD-10-CM | POA: Diagnosis not present

## 2022-01-01 DIAGNOSIS — Z87440 Personal history of urinary (tract) infections: Secondary | ICD-10-CM

## 2022-01-01 DIAGNOSIS — K900 Celiac disease: Secondary | ICD-10-CM | POA: Diagnosis present

## 2022-01-01 DIAGNOSIS — J449 Chronic obstructive pulmonary disease, unspecified: Secondary | ICD-10-CM | POA: Diagnosis present

## 2022-01-01 DIAGNOSIS — Z807 Family history of other malignant neoplasms of lymphoid, hematopoietic and related tissues: Secondary | ICD-10-CM

## 2022-01-01 DIAGNOSIS — R200 Anesthesia of skin: Secondary | ICD-10-CM | POA: Diagnosis present

## 2022-01-01 DIAGNOSIS — R519 Headache, unspecified: Secondary | ICD-10-CM | POA: Diagnosis not present

## 2022-01-01 DIAGNOSIS — R4182 Altered mental status, unspecified: Secondary | ICD-10-CM | POA: Diagnosis not present

## 2022-01-01 DIAGNOSIS — R410 Disorientation, unspecified: Secondary | ICD-10-CM | POA: Diagnosis not present

## 2022-01-01 DIAGNOSIS — R000 Tachycardia, unspecified: Secondary | ICD-10-CM | POA: Diagnosis not present

## 2022-01-01 DIAGNOSIS — Z5329 Procedure and treatment not carried out because of patient's decision for other reasons: Secondary | ICD-10-CM | POA: Diagnosis present

## 2022-01-01 LAB — CBC WITH DIFFERENTIAL/PLATELET
Abs Immature Granulocytes: 0.03 10*3/uL (ref 0.00–0.07)
Basophils Absolute: 0 10*3/uL (ref 0.0–0.1)
Basophils Relative: 0 %
Eosinophils Absolute: 0 10*3/uL (ref 0.0–0.5)
Eosinophils Relative: 0 %
HCT: 46.9 % — ABNORMAL HIGH (ref 36.0–46.0)
Hemoglobin: 15.9 g/dL — ABNORMAL HIGH (ref 12.0–15.0)
Immature Granulocytes: 0 %
Lymphocytes Relative: 12 %
Lymphs Abs: 0.8 10*3/uL (ref 0.7–4.0)
MCH: 30.6 pg (ref 26.0–34.0)
MCHC: 33.9 g/dL (ref 30.0–36.0)
MCV: 90.4 fL (ref 80.0–100.0)
Monocytes Absolute: 0.4 10*3/uL (ref 0.1–1.0)
Monocytes Relative: 7 %
Neutro Abs: 5.5 10*3/uL (ref 1.7–7.7)
Neutrophils Relative %: 81 %
Platelets: 190 10*3/uL (ref 150–400)
RBC: 5.19 MIL/uL — ABNORMAL HIGH (ref 3.87–5.11)
RDW: 12.3 % (ref 11.5–15.5)
WBC: 6.8 10*3/uL (ref 4.0–10.5)
nRBC: 0 % (ref 0.0–0.2)

## 2022-01-01 LAB — RESP PANEL BY RT-PCR (FLU A&B, COVID) ARPGX2
Influenza A by PCR: NEGATIVE
Influenza B by PCR: NEGATIVE
SARS Coronavirus 2 by RT PCR: NEGATIVE

## 2022-01-01 LAB — COMPREHENSIVE METABOLIC PANEL
ALT: 25 U/L (ref 0–44)
AST: 29 U/L (ref 15–41)
Albumin: 4.4 g/dL (ref 3.5–5.0)
Alkaline Phosphatase: 61 U/L (ref 38–126)
Anion gap: 11 (ref 5–15)
BUN: 23 mg/dL (ref 8–23)
CO2: 28 mmol/L (ref 22–32)
Calcium: 10.4 mg/dL — ABNORMAL HIGH (ref 8.9–10.3)
Chloride: 99 mmol/L (ref 98–111)
Creatinine, Ser: 0.82 mg/dL (ref 0.44–1.00)
GFR, Estimated: >60 mL/min (ref >60)
Glucose, Bld: 107 mg/dL — ABNORMAL HIGH (ref 70–99)
Potassium: 4 mmol/L (ref 3.5–5.1)
Sodium: 138 mmol/L (ref 135–145)
Total Bilirubin: 0.9 mg/dL (ref 0.3–1.2)
Total Protein: 7.2 g/dL (ref 6.5–8.1)

## 2022-01-01 LAB — URINALYSIS, ROUTINE W REFLEX MICROSCOPIC
Bilirubin Urine: NEGATIVE
Glucose, UA: NEGATIVE mg/dL
Ketones, ur: NEGATIVE mg/dL
Leukocytes,Ua: NEGATIVE
Nitrite: NEGATIVE
Protein, ur: 100 mg/dL — AB
Specific Gravity, Urine: 1.008 (ref 1.005–1.030)
pH: 8 (ref 5.0–8.0)

## 2022-01-01 LAB — PROTIME-INR
INR: 0.9 (ref 0.8–1.2)
Prothrombin Time: 12.4 seconds (ref 11.4–15.2)

## 2022-01-01 LAB — LACTIC ACID, PLASMA: Lactic Acid, Venous: 1.3 mmol/L (ref 0.5–1.9)

## 2022-01-01 LAB — APTT: aPTT: 23 seconds — ABNORMAL LOW (ref 24–36)

## 2022-01-01 MED ORDER — LORAZEPAM 2 MG/ML IJ SOLN
INTRAMUSCULAR | Status: AC
  Administered 2022-01-01: 1 mg via INTRAVENOUS
  Filled 2022-01-01: qty 1

## 2022-01-01 MED ORDER — OMEPRAZOLE 20 MG PO CPDR
20.0000 mg | DELAYED_RELEASE_CAPSULE | Freq: Two times a day (BID) | ORAL | Status: DC
  Administered 2022-01-01 – 2022-01-03 (×4): 20 mg via ORAL
  Filled 2022-01-01 (×5): qty 1

## 2022-01-01 MED ORDER — LABETALOL HCL 5 MG/ML IV SOLN
10.0000 mg | Freq: Once | INTRAVENOUS | Status: AC
  Administered 2022-01-01: 10 mg via INTRAVENOUS
  Filled 2022-01-01: qty 4

## 2022-01-01 MED ORDER — LACTATED RINGERS IV SOLN: INTRAVENOUS | Status: AC

## 2022-01-01 MED ORDER — ASPIRIN 300 MG RE SUPP: 300.0000 mg | Freq: Every day | RECTAL | Status: DC

## 2022-01-01 MED ORDER — MONTELUKAST SODIUM 10 MG PO TABS
10.0000 mg | ORAL_TABLET | Freq: Every day | ORAL | Status: DC
Start: 2022-01-01 — End: 2022-01-03
  Administered 2022-01-01 – 2022-01-02 (×2): 10 mg via ORAL
  Filled 2022-01-01 (×3): qty 1

## 2022-01-01 MED ORDER — LEVOTHYROXINE SODIUM 112 MCG PO TABS
112.0000 ug | ORAL_TABLET | Freq: Every day | ORAL | Status: DC
  Administered 2022-01-02 – 2022-01-03 (×2): 112 ug via ORAL
  Filled 2022-01-01 (×3): qty 1

## 2022-01-01 MED ORDER — LACTATED RINGERS IV BOLUS (SEPSIS): 1000.0000 mL | Freq: Once | INTRAVENOUS | Status: DC

## 2022-01-01 MED ORDER — CLOPIDOGREL BISULFATE 75 MG PO TABS
75.0000 mg | ORAL_TABLET | Freq: Every day | ORAL | Status: DC
  Administered 2022-01-02 – 2022-01-03 (×2): 75 mg via ORAL
  Filled 2022-01-01 (×2): qty 1

## 2022-01-01 MED ORDER — ACETAMINOPHEN 325 MG PO TABS
650.0000 mg | ORAL_TABLET | Freq: Once | ORAL | Status: AC
Start: 2022-01-01 — End: 2022-01-01
  Administered 2022-01-01: 650 mg via ORAL
  Filled 2022-01-01: qty 2

## 2022-01-01 MED ORDER — ASPIRIN 325 MG PO TABS
325.0000 mg | ORAL_TABLET | Freq: Every day | ORAL | Status: DC
  Administered 2022-01-01 – 2022-01-02 (×2): 325 mg via ORAL
  Filled 2022-01-01 (×2): qty 1

## 2022-01-01 MED ORDER — ACETAMINOPHEN 650 MG RE SUPP: 650.0000 mg | RECTAL | Status: DC | PRN

## 2022-01-01 MED ORDER — LORAZEPAM 2 MG/ML IJ SOLN
1.0000 mg | Freq: Once | INTRAMUSCULAR | Status: AC
Start: 2022-01-01 — End: 2022-01-01

## 2022-01-01 MED ORDER — STROKE: EARLY STAGES OF RECOVERY BOOK
Freq: Once | Status: DC
  Filled 2022-01-01: qty 1

## 2022-01-01 MED ORDER — ACETAMINOPHEN 160 MG/5ML PO SOLN: 650.0000 mg | ORAL | Status: DC | PRN

## 2022-01-01 MED ORDER — FENOFIBRATE 160 MG PO TABS
160.0000 mg | ORAL_TABLET | Freq: Every day | ORAL | Status: DC
  Administered 2022-01-01 – 2022-01-03 (×3): 160 mg via ORAL
  Filled 2022-01-01 (×3): qty 1

## 2022-01-01 MED ORDER — ACETAMINOPHEN 325 MG PO TABS
650.0000 mg | ORAL_TABLET | ORAL | Status: DC | PRN
  Administered 2022-01-02: 650 mg via ORAL
  Filled 2022-01-01: qty 2

## 2022-01-01 MED ORDER — LACTATED RINGERS IV BOLUS (SEPSIS)
1000.0000 mL | Freq: Once | INTRAVENOUS | Status: AC
  Administered 2022-01-01: 1000 mL via INTRAVENOUS

## 2022-01-01 MED ORDER — SODIUM CHLORIDE 0.9 % IV SOLN
1.0000 g | INTRAVENOUS | Status: DC
  Administered 2022-01-01: 1 g via INTRAVENOUS
  Filled 2022-01-01: qty 10

## 2022-01-01 MED ORDER — SODIUM CHLORIDE 0.9 % IV SOLN: INTRAVENOUS | Status: DC

## 2022-01-01 NOTE — Assessment & Plan Note (Signed)
Chronic stable cont home meds

## 2022-01-01 NOTE — Assessment & Plan Note (Signed)
Obtain echo

## 2022-01-01 NOTE — H&P (Signed)
Felicia Washington HWK:[id number] DOB: [birth year] DOA: 01/01/2022    PCP: Reynold Bowen, MD   Outpatient Specialists:      NEphrology: Dr. Posey Pronto  Endocrinology Dr. Milas Gain  Patient arrived to ER on 01/01/22 at 0829 Referred by Attending Toy Baker, MD   Patient coming from: home Lives alone,        Chief Complaint:   Chief Complaint  Patient presents with   Altered Mental Status    HPI: Felicia Washington is a 76 y.o. female with medical history significant of asthma hypertension hyperlipidemia GERD hypothyroidism, celiac disease    Presented with    confusion and slurred speech Increqsed confusion LKW last night When daughter called in this AM was too confused Family states she has been having dysuria and increased urination was diagnosed with UTI and treated with amoxicillin finished course of antibiotics with only some improvement she also has been complaining of headache patient herself just stating she feels off but she is unsure which way.  No pain no hematuria per daughter maybe she had had a mild fever this morning.  Recently seen by nephrology bc she had a lot of protein in her urine had recent US  No tobacco no EtOH Had low grade fever subjectively in ER 99.7  Has   been vaccinated against COVID  and boosted had    flu shot   Initial COVID TEST  NEGATIVE   Lab Results  Component Value Date   [place] NEGATIVE 01/01/2022   [place] NEGATIVE 11/10/2019     Regarding pertinent Chronic problems:     Hyperlipidemia -  on fenofibrate Lipid Panel  No results found for: CHOL, TRIG, HDL, CHOLHDL, VLDL, LDLCALC, LDLDIRECT, LABVLDL   HTN on Benicar   Hypothyroidism: No results found for: TSH on synthroid     Asthma -well   controlled on home inhalers       While in ER:   CT head unremarkable but MRI of the brain showed evidence of CVA Dr. Quinn Axe neurology has been notified   Ordered  CT HEAD   NON acute  CXR -  Cardiomegaly without acute  abnormality of the lungs in AP portable projection    Following Medications were ordered in ER: Medications  lactated ringers infusion ( Intravenous Not Given 01/01/22 1537)  lactated ringers bolus 1,000 mL (0 mLs Intravenous Stopped 01/01/22 1235)  labetalol (NORMODYNE) injection 10 mg (10 mg Intravenous Given 01/01/22 0929)  labetalol (NORMODYNE) injection 10 mg (10 mg Intravenous Given 01/01/22 1421)  LORazepam (ATIVAN) injection 1 mg (1 mg Intravenous Given 01/01/22 1534)  acetaminophen (TYLENOL) tablet 650 mg (650 mg Oral Given 01/01/22 1806)    _______________________________________________________ ER Provider Called: Neurology    Dr. Quinn Axe They Recommend admit to medicine   Will see in   ER   ED Triage Vitals  Enc Vitals Group     BP 01/01/22 0839 (!) 191/119     Pulse Rate 01/01/22 0839 (!) 116     Resp 01/01/22 0839 (!) 21     Temp 01/01/22 0839 98.8 F (37.1 C)     Temp Source 01/01/22 0839 Oral     SpO2 01/01/22 0839 96 %     Weight 01/01/22 0844 136 lb 0.4 oz (61.7 kg)     Height 01/01/22 0844 5' 5.5" (1.664 m)     Head Circumference --      Peak Flow --      Pain Score 01/01/22 0844 0  Pain Loc --      Pain Edu? --      Excl. in [place]? --   TMAX(24)@     _________________________________________ Significant initial  Findings: Abnormal Labs Reviewed  COMPREHENSIVE METABOLIC PANEL - Abnormal; Notable for the following components:      Result Value   Glucose, Bld 107 (*)    Calcium 10.4 (*)    All other components within normal limits  CBC WITH DIFFERENTIAL/PLATELET - Abnormal; Notable for the following components:   RBC 5.19 (*)    Hemoglobin 15.9 (*)    HCT 46.9 (*)    All other components within normal limits  APTT - Abnormal; Notable for the following components:   aPTT 23 (*)    All other components within normal limits  URINALYSIS, ROUTINE W REFLEX MICROSCOPIC - Abnormal; Notable for the following components:   Color, Urine STRAW (*)    Hgb urine dipstick  SMALL (*)    Protein, ur 100 (*)    Bacteria, UA RARE (*)    All other components within normal limits    ECG: Ordered Personally reviewed by me showing: HR : 116 Rhythm:  Sinus tachycardia   no evidence of ischemic changes QTC 463    The recent clinical data is shown below. Vitals:   01/01/22 1700 01/01/22 1705 01/01/22 1745 01/01/22 1900  BP: (!) 167/95  (!) 171/95 135/76  Pulse: 88  95 79  Resp: (!) 24  (!) 22 (!) 22  Temp:  99 F (37.2 C)    TempSrc:  Rectal    SpO2: 98%  95% 95%  Weight:      Height:        WBC     Component Value Date/Time   WBC 6.8 01/01/2022 0942   LYMPHSABS 0.8 01/01/2022 0942   MONOABS 0.4 01/01/2022 0942   EOSABS 0.0 01/01/2022 0942   BASOSABS 0.0 01/01/2022 0942     Lactic Acid, Venous    Component Value Date/Time   LATICACIDVEN 1.3 01/01/2022 0942       UA  few bacteria   Urine analysis:    Component Value Date/Time   COLORURINE STRAW (A) 01/01/2022 1133   APPEARANCEUR CLEAR 01/01/2022 1133   LABSPEC 1.008 01/01/2022 1133   PHURINE 8.0 01/01/2022 1133   GLUCOSEU NEGATIVE 01/01/2022 1133   HGBUR SMALL (A) 01/01/2022 1133   BILIRUBINUR NEGATIVE 01/01/2022 1133   KETONESUR NEGATIVE 01/01/2022 1133   PROTEINUR 100 (A) 01/01/2022 1133   NITRITE NEGATIVE 01/01/2022 1133   LEUKOCYTESUR NEGATIVE 01/01/2022 1133    Results for orders placed or performed during the hospital encounter of 01/01/22  Resp Panel by RT-PCR (Flu A&B, Covid) Nasopharyngeal Swab     Status: None   Collection Time: 01/01/22  9:42 AM   Specimen: Nasopharyngeal Swab; Nasopharyngeal(NP) swabs in vial transport medium  Result Value Ref Range Status   SARS Coronavirus 2 by RT PCR NEGATIVE NEGATIVE Final         Influenza A by PCR NEGATIVE NEGATIVE Final   Influenza B by PCR NEGATIVE NEGATIVE Final           _______________________________________________ Hospitalist was called for admission for CVA  The following Work up has been ordered so  far:  Orders Placed This Encounter  Procedures   Resp Panel by RT-PCR (Flu A&B, Covid) Nasopharyngeal Swab   Blood Culture (routine x 2)   Urine Culture   DG Chest Port 1 View   CT Head Wo Contrast  MR Brain Wo Contrast (neuro protocol)   Comprehensive metabolic panel   CBC WITH DIFFERENTIAL   Protime-INR   APTT   Urinalysis, Routine w reflex microscopic   Cardiac monitoring   Document height and weight   Assess and Document Glasgow Coma Scale   Document vital signs within 1-hour of fluid bolus completion. Notify provider of abnormal vital signs despite fluid resuscitation.   DO NOT delay antibiotics if unable to obtain blood culture.   Refer to Sidebar Report: Sepsis Sidebar ED/IP   Notify provider for difficulties obtaining IV access.   Insert peripheral IV x 2   Initiate Carrier Fluid Protocol   Nursing Communication May have a few ice chips, nowater   Stroke swallow screen   Cardiac monitoring   Consult to neurology   Consult to hospitalist   Pulse oximetry, continuous   ED EKG 12-Lead   EKG 12-Lead   Admit to Inpatient (patient's expected length of stay will be greater than 2 midnights or inpatient only procedure)     OTHER Significant initial  Findings:  labs showing:    Recent Labs  Lab 01/01/22 0942  NA 138  K 4.0  CO2 28  GLUCOSE 107*  BUN 23  CREATININE 0.82  CALCIUM 10.4*    Cr   stable,  Lab Results  Component Value Date   CREATININE 0.82 01/01/2022   CREATININE 1.17 (H) 08/23/2018   CREATININE 2.29 (H) 01/20/2016    Recent Labs  Lab 01/01/22 0942  AST 29  ALT 25  ALKPHOS 61  BILITOT 0.9  PROT 7.2  ALBUMIN 4.4   Lab Results  Component Value Date   CALCIUM 10.4 (H) 01/01/2022     Plt: Lab Results  Component Value Date   PLT 190 01/01/2022    Recent Labs  Lab 01/01/22 0942  WBC 6.8  NEUTROABS 5.5  HGB 15.9*  HCT 46.9*  MCV 90.4  PLT 190    HG/HCT stable,      Component Value Date/Time   HGB 15.9 (H) 01/01/2022  0942   HCT 46.9 (H) 01/01/2022 0942   MCV 90.4 01/01/2022 0942        Radiological Exams on Admission: CT Head Wo Contrast  Result Date: 01/01/2022 CLINICAL DATA:  Mental status change. Increased confusion. Headache. EXAM: CT HEAD WITHOUT CONTRAST TECHNIQUE: Contiguous axial images were obtained from the base of the skull through the vertex without intravenous contrast. COMPARISON:  None. FINDINGS: Brain: There is no evidence of an acute infarct, intracranial hemorrhage, mass, midline shift, or extra-axial fluid collection. Mild cerebral atrophy is within normal limits for age. Patchy hypodensities in the cerebral white matter bilaterally are nonspecific but compatible with mild-to-moderate chronic small vessel ischemic disease. Vascular: Calcified atherosclerosis at the skull base. No hyperdense vessel. Skull: No acute fracture or suspicious osseous lesion. Sinuses/Orbits: Paranasal sinuses and mastoid air cells are clear. Unremarkable orbits. Other: None. IMPRESSION: 1. No evidence of acute intracranial abnormality. 2. Mild-to-moderate chronic small vessel ischemic disease. Electronically Signed   By: Logan Bores M.D.   On: 01/01/2022 13:01   MR Brain Wo Contrast (neuro protocol)  Result Date: 01/01/2022 CLINICAL DATA:  Provided history: Neuro deficit, acute, stroke suspected; speech change. EXAM: MRI HEAD WITHOUT CONTRAST TECHNIQUE: Multiplanar, multiecho pulse sequences of the brain and surrounding structures were obtained without intravenous contrast. COMPARISON:  Head CT 01/01/2022. FINDINGS: Brain: Mild intermittent motion degradation. Mild generalized cerebral and cerebellar atrophy. Punctate acute cortical infarct within the left parietal lobe (series 3, image  37). Two additional punctate foci of diffusion weighted hyperintensity, one within the medial left frontal lobe (series 3, image 39), and the other within the right precentral gyrus (series 3, image 40). These foci are appreciated only on  the axial diffusion-weighted sequence, and may reflect additional punctate acute infarcts or image noise artifact. Background mild-to-moderate multifocal T2 FLAIR hyperintense signal abnormality within the cerebral white matter, nonspecific but compatible with chronic small vessel ischemic disease. No evidence of an intracranial mass. No chronic intracranial blood products. No extra-axial fluid collection. No midline shift. Vascular: Maintained flow voids within the proximal large arterial vessels. Skull and upper cervical spine: No focal suspicious marrow lesion. Sinuses/Orbits: Visualized orbits show no acute finding. Minimal mucosal thickening within the bilateral ethmoid and maxillary sinuses. IMPRESSION: Punctate acute cortical infarct within the left parietal lobe. Two additional punctate foci of diffusion-weighted hyperintensity, one within the medial left frontal lobe (left ACA vascular territory), and the other within the right precentral gyrus (right MCA vascular territory). These foci are appreciated on the axial diffusion-weighted sequence only, and may reflect additional punctate acute infarcts or image noise artifact. Mild-to-moderate chronic small vessel ischemic changes within the cerebral white matter. Mild generalized parenchymal atrophy. Electronically Signed   By: Kellie Simmering D.O.   On: 01/01/2022 16:40   DG Chest Port 1 View  Result Date: 01/01/2022 CLINICAL DATA:  Questionable sepsis, altered mental status EXAM: PORTABLE CHEST 1 VIEW COMPARISON:  09/27/2019 FINDINGS: Cardiomegaly. Hiatal hernia. Both lungs are clear. Right shoulder reverse total arthroplasty. IMPRESSION: 1. Cardiomegaly without acute abnormality of the lungs in AP portable projection. 2.  Hiatal hernia. Electronically Signed   By: Delanna Ahmadi M.D.   On: 01/01/2022 10:00   _______________________________________________________________________________________________________ Latest  Blood pressure 135/76, pulse 79,  temperature 99 F (37.2 C), temperature source Rectal, resp. rate (!) 22, height 5' 5.5" (1.664 m), weight 61.7 kg, SpO2 95 %.   Vitals  labs and radiology finding personally reviewed  Review of Systems:    Pertinent positives include:  confusion  Constitutional:  No weight loss, night sweats, Fevers, chills, fatigue, weight loss  HEENT:  No headaches, Difficulty swallowing,Tooth/dental problems,Sore throat,  No sneezing, itching, ear ache, nasal congestion, post nasal drip,  Cardio-vascular:  No chest pain, Orthopnea, PND, anasarca, dizziness, palpitations.no Bilateral lower extremity swelling  GI:  No heartburn, indigestion, abdominal pain, nausea, vomiting, diarrhea, change in bowel habits, loss of appetite, melena, blood in stool, hematemesis Resp:  no shortness of breath at rest. No dyspnea on exertion, No excess mucus, no productive cough, No non-productive cough, No coughing up of blood.No change in color of mucus.No wheezing. Skin:  no rash or lesions. No jaundice GU:  no dysuria, change in color of urine, no urgency or frequency. No straining to urinate.  No flank pain.  Musculoskeletal:  No joint pain or no joint swelling. No decreased range of motion. No back pain.  Psych:  No change in mood or affect. No depression or anxiety. No memory loss.  Neuro: no localizing neurological complaints, no tingling, no weakness, no double vision, no gait abnormality, no slurred speech, no   All systems reviewed and apart from [place] all are negative _______________________________________________________________________________________________ Past Medical History:   Past Medical History:  Diagnosis Date   Anemia    Arthritis    "fingers, shoulders" (09/01/2018)   Asthma    Celiac disease    GERD (gastroesophageal reflux disease)    Graves disease    Heart murmur    History  of blood transfusion    "related to c-section"   History of gout    fingers   HLD (hyperlipidemia)     HTN (hypertension)    Hypothyroidism    Migraine    "1 q couple years" (09/01/2018)   Pneumonia    "several times in 1 yr" (09/01/2018)   PONV (postoperative nausea and vomiting)    Seasonal allergies       Past Surgical History:  Procedure Laterality Date   CESAREAN SECTION  1975   COLONOSCOPY     POLYPECTOMY     REVERSE SHOULDER ARTHROPLASTY Right 09/01/2018   REVERSE SHOULDER ARTHROPLASTY Right 09/01/2018   Procedure: RIGHT REVERSE SHOULDER ARTHROPLASTY;  Surgeon: Justice Britain, MD;  Location: [facility];  Service: Orthopedics;  Laterality: Right;   SHOULDER ARTHROSCOPY W/ ROTATOR CUFF REPAIR Bilateral    THYROIDECTOMY     TONSILLECTOMY      Social History:  Ambulatory   independently      reports that she has never smoked. She has never used smokeless tobacco. She reports that she does not drink alcohol and does not use drugs.     Family History:   Family History  Problem Relation Age of Onset   Multiple myeloma Father    Bone cancer Father    Celiac disease Sister    Colon polyps Sister    Thyroid disease Mother    Gallstones Mother    Heart attack Maternal Grandfather    Colon polyps Brother    Diabetes Maternal Grandmother    ______________________________________________________________________________________________ Allergies: Allergies  Allergen Reactions   Adhesive [Tape] Other (See Comments)    Removes skin   Allopurinol Swelling, Hives and Itching   Codeine Nausea Only   Latex      Prior to Admission medications   Medication Sig Start Date End Date Taking? Authorizing Provider  ampicillin (PRINCIPEN) 500 MG capsule Take 500 mg by mouth daily.  03/07/16  Yes [provider]  fenofibrate micronized (LOFIBRA) 67 MG capsule Take 67 mg by mouth daily. 08/08/18  Yes [provider]  levothyroxine (SYNTHROID, LEVOTHROID) 112 MCG tablet Take 112 mcg by mouth daily before breakfast.    Yes [provider]  montelukast (SINGULAIR) 10 MG  tablet Take 10 mg by mouth at bedtime.   Yes [provider]  olmesartan (BENICAR) 40 MG tablet Take 40 mg by mouth daily.   Yes [provider]  Omeprazole 20 MG TBDD Take 20 mg by mouth 2 (two) times daily.    Yes [provider]    ___________________________________________________________________________________________________ Physical Exam: Vitals with BMI 01/01/2022 01/01/2022 01/01/2022  Height - - -  Weight - - -  BMI - - -  Systolic 592 924 462  Diastolic 76 95 95  Pulse 79 95 88     1. General:  in No  Acute distress   Chronically ill   -appearing 2. Psychological: Alert and   Oriented 3. Head/ENT:   Dry Mucous Membranes                          Head Non traumatic, neck supple                          Normal  Dentition 4. SKIN: decreased Skin turgor,  Skin clean Dry and intact no rash 5. Heart: Regular rate and rhythm no  Murmur, no Rub or gallop 6. Lungs:  Clear  to auscultation bilaterally, no wheezes or crackles   7. Abdomen: Soft,  non-tender, Non distended bowel sounds present 8. Lower extremities: no clubbing, cyanosis, no  edema 9. Neurologically   strength 5 out of 5 in all 4 extremities cranial nerves II through XII intact 10. MSK: Normal range of motion    Chart has been reviewed  ______________________________________________________________________________________________  Assessment/Plan 76 y.o. female with medical history significant of asthma hypertension hyperlipidemia GERD hypothyroidism   Admitted for  CVA  Present on Admission:  CVA (cerebral vascular accident) ([place])  Hypothyroidism  Essential hypertension  Hypercholesteremia  Asthma  Cardiomegaly  Celiac disease  Acute lower UTI  Acute metabolic encephalopathy    CVA (cerebral vascular accident) ([place])  - will admit based on TIA/CVA protocol        MRI  Resulted - showing acute ischemic CVA       Punctate acute cortical infarct within the left parietal lobe. Two  additional punctate foci of diffusion-weighted hyperintensity, one within the medial left frontal lobe (left ACA vascular territory), and the other within the right precentral gyrus (right MCA vascular territory).        Echo to evaluate for possible embolic source,        obtain cardiac enzymes,  ECG,   Lipid panel, TSH.        Order PT/OT evaluation.        keep nothing by mouth until passes swallow eval         Will make sure patient is on antiplatelet ASA 81   Plavix agent and statin        Allow permissive Hypertension keep BP <220/120        Neurology consulted will see pt in ER    Hypothyroidism - Check TSH continue home medications at current dose   Essential hypertension Allow permissive HTn   Hypercholesteremia Check lipid panel ans start statin if need   Asthma Chronic stable cont home meds   Cardiomegaly Obtain echo  Celiac disease glutelin free diet  Acute lower UTI Low grade fevere, confusion, dysuria, slight amount of bacteria in urine recently was on amoxicilin Treat with rocephin and obtain urine culture  Acute metabolic encephalopathy   - most likely multifactorial secondary to combination of infection   mild dehydration secondary to decreased by mouth intake, small cva  - Will rehydrate   - treat underlining infection   - Hold contributing medications   -  MRI of the brain showed CVA  - neurological exam appears to be nonfocal        Other plan as per orders.  DVT prophylaxis:  SCD     Code Status:    Code Status: Prior FULL CODE  as per patient   I had personally discussed CODE STATUS with patient and family    Family Communication:   Family at  Bedside  plan of care was discussed   with  Daughter,    Disposition Plan:     To home once workup is complete and patient is stable   Following barriers for discharge:                                                        Will need consultants to evaluate patient prior to discharge  Would benefit from PT/OT eval prior to DC  Ordered                                        Consults called:    Neurology is aware  Admission status:  ED Disposition     ED Disposition  [place]: [place] [[id number]]  Level of Care: Telemetry Medical [[id number]]  May admit patient to [facility] or [facility] if equivalent level of care is available:: No  Covid Evaluation: Confirmed COVID Negative  Diagnosis: CVA (cerebral vascular accident) [facility]) [[id number]]  Admitting Physician: Toy Baker [[id number]]  Attending Physician: Toy Baker [[id number]]  Estimated length of stay: past midnight tomorrow  Certification:: I certify this patient will need inpatient services for at least 2 midnights          inpatient     I Expect 2 midnight stay secondary to severity of patient's current illness need for inpatient interventions justified by the following:  hemodynamic instability despite optimal treatment (tachycardia )   Severe lab/radiological/exam abnormalities including:     and extensive comorbidities including:  COPD/asthma    That are currently affecting medical management.   I expect  patient to be hospitalized for 2 midnights requiring inpatient medical care.  Patient is at high risk for adverse outcome (such as loss of life or disability) if not treated.  Indication for inpatient stay as follows:  Severe change from baseline regarding mental status    Need for IV fluids     Level of care      tele indefinitely please discontinue once patient no longer qualifies COVID-19 Labs    Lab Results  Component Value Date   [place] NEGATIVE 01/01/2022     Precautions: admitted as   Covid Negative    Ransom Nickson 01/01/2022, 10:51 PM    [facility]     after 2 AM please page floor coverage PA If 7AM-7PM, please contact the day team taking care of the patient using [email or web address]    Patient was evaluated in the context of the global COVID-19 pandemic, which necessitated consideration that the patient might be at risk for infection with the SARS-CoV-2 virus that causes COVID-19. Institutional protocols and algorithms that pertain to the evaluation of patients at risk for COVID-19 are in a state of rapid change based on information released by regulatory bodies including the CDC and federal and state organizations. These policies and algorithms were followed during the patient's care.

## 2022-01-01 NOTE — ED Notes (Signed)
Patient transported to MRI 

## 2022-01-01 NOTE — ED Notes (Signed)
Received verbal report from Kasey C RN at this time °

## 2022-01-01 NOTE — Assessment & Plan Note (Signed)
Low grade fevere, confusion, dysuria, slight amount of bacteria in urine recently was on amoxicilin Treat with rocephin and obtain urine culture

## 2022-01-01 NOTE — ED Triage Notes (Signed)
BIB EMS from home for increased confusion, pt is alert to person and place only, baseline is a/o4, pt is unable to identify common objects, family reports increased urination and tx of UTI w/amoxicillin, pt has c/o headache

## 2022-01-01 NOTE — Assessment & Plan Note (Addendum)
-   will admit based on TIA/CVA protocol        MRI  Resulted - showing acute ischemic CVA       Punctate acute cortical infarct within the left parietal lobe. Two additional punctate foci of diffusion-weighted hyperintensity, one within the medial left frontal lobe (left ACA vascular territory), and the other within the right precentral gyrus (right MCA vascular territory).        Echo to evaluate for possible embolic source,        obtain cardiac enzymes,  ECG,   Lipid panel, TSH.        Order PT/OT evaluation.        keep nothing by mouth until passes swallow eval         Will make sure patient is on antiplatelet ASA 81   Plavix agent and statin        Allow permissive Hypertension keep BP <220/120        Neurology consulted will see pt in ER

## 2022-01-01 NOTE — Assessment & Plan Note (Signed)
-   most likely multifactorial secondary to combination of infection   mild dehydration secondary to decreased by mouth intake, small cva  - Will rehydrate   - treat underlining infection   - Hold contributing medications   -  MRI of the brain showed CVA  - neurological exam appears to be nonfocal

## 2022-01-01 NOTE — ED Notes (Signed)
Patient transported to CT 

## 2022-01-01 NOTE — Consult Note (Addendum)
NEUROLOGY CONSULTATION NOTE   Date of service: January 01, 2022 Patient Name: Felicia Washington MRN:  [id number] DOB:  1946/09/05 Reason for consult: "Stroke on MRI" Requesting Provider: Toy Baker, MD _ _ _   _ __   _ __ _ _  __ __   _ __   __ _  History of Present Illness  Felicia Washington is a 76 y.o. female with PMH significant for hyperlipidemia, hypertension, hypothyroidism, migraine, GERD who presents with an episode of word finding difficulties with numbness in her right hand that resolved.  She was also recently treated for UTI with amoxicillin.  Patient reports that she went to bed at 2030 on 12/31/2021 and woke up at 0530 on 01/01/2022 with finding the right word.  She reports that she had trouble with her vision also and she could not see her coffee cup her coffee maker.  She tried to call her daughter and spoke to her daughter but kept repeating the same phrase multiple times.  At times her speech seemed like it was more of a word salad.  Daughter called EMS and her symptoms had significantly improved by the time EMS got there and she was brought into the ER for further evaluation and work-up.  She endorses history of hypertension and hyperlipidemia and is unclear how well her blood pressure is controlled.  She does not monitor her blood pressure at home.  She denies any prior history of strokes.  Endorses that her sister had a stroke.  She had workup with MRI Brain without contrast which demonstrated an acute punctate cortical infarct in the left parietal lobe along with an additional punctate stroke in the medial left frontal lobe and another in the right precentral gyrus.   LKW: 2030 on 12/31/2021. mRS: 0 tNKASE: Not offered due to resolution of symptoms. Thrombectomy: Not offered due to resolution of symptoms. NIHSS components Score: Comment  1a Level of Conscious 0'[x]'  1'[]'  2'[]'  3'[]'      1b LOC Questions 0'[x]'  1'[]'  2'[]'       1c LOC Commands 0'[x]'  1'[]'  2'[]'       2 Best Gaze 0'[x]'  1'[]'   2'[]'       3 Visual 0'[x]'  1'[]'  2'[]'  3'[]'      4 Facial Palsy 0'[x]'  1'[]'  2'[]'  3'[]'      5a Motor Arm - left 0'[x]'  1'[]'  2'[]'  3'[]'  4'[]'  UN'[]'    5b Motor Arm - Right 0'[x]'  1'[]'  2'[]'  3'[]'  4'[]'  UN'[]'    6a Motor Leg - Left 0'[x]'  1'[]'  2'[]'  3'[]'  4'[]'  UN'[]'    6b Motor Leg - Right 0'[x]'  1'[]'  2'[]'  3'[]'  4'[]'  UN'[]'    7 Limb Ataxia 0'[x]'  1'[]'  2'[]'  3'[]'  UN'[]'     8 Sensory 0'[x]'  1'[]'  2'[]'  UN'[]'      9 Best Language 0'[x]'  1'[]'  2'[]'  3'[]'      10 Dysarthria 0'[x]'  1'[]'  2'[]'  UN'[]'      11 Extinct. and Inattention 0'[x]'  1'[]'  2'[]'       TOTAL: 0       ROS   Constitutional Denies weight loss, fever and chills.   HEENT Denies changes in vision and hearing.   Respiratory Denies SOB and cough.   CV Denies palpitations and CP   GI Denies abdominal pain, nausea, vomiting and diarrhea.   GU Denies dysuria and urinary frequency.   MSK Denies myalgia and joint pain.   Skin Denies rash and pruritus.   Neurological Denies headache and syncope.   Psychiatric Denies recent changes in mood. Denies anxiety and depression.    Past History  Past Medical History:  Diagnosis Date   Anemia    Arthritis    "fingers, shoulders" (09/01/2018)   Asthma    Celiac disease    GERD (gastroesophageal reflux disease)    Graves disease    Heart murmur    History of blood transfusion    "related to c-section"   History of gout    fingers   HLD (hyperlipidemia)    HTN (hypertension)    Hypothyroidism    Migraine    "1 q couple years" (09/01/2018)   Pneumonia    "several times in 1 yr" (09/01/2018)   PONV (postoperative nausea and vomiting)    Seasonal allergies    Past Surgical History:  Procedure Laterality Date   CESAREAN SECTION  1975   COLONOSCOPY     POLYPECTOMY     REVERSE SHOULDER ARTHROPLASTY Right 09/01/2018   REVERSE SHOULDER ARTHROPLASTY Right 09/01/2018   Procedure: RIGHT REVERSE SHOULDER ARTHROPLASTY;  Surgeon: Justice Britain, MD;  Location: [facility];  Service: Orthopedics;  Laterality: Right;   SHOULDER ARTHROSCOPY W/ ROTATOR CUFF REPAIR Bilateral    THYROIDECTOMY      TONSILLECTOMY     Family History  Problem Relation Age of Onset   Multiple myeloma Father    Bone cancer Father    Celiac disease Sister    Colon polyps Sister    Thyroid disease Mother    Gallstones Mother    Heart attack Maternal Grandfather    Colon polyps Brother    Diabetes Maternal Grandmother    Social History   Socioeconomic History   Marital status: Widowed    Spouse name: Not on file   Number of children: 3   Years of education: Not on file   Highest education level: Not on file  Occupational History   Occupation: sales  Tobacco Use   Smoking status: Never   Smokeless tobacco: Never  Vaping Use   Vaping Use: Never used  Substance and Sexual Activity   Alcohol use: No    Alcohol/week: 0.0 standard drinks   Drug use: No   Sexual activity: Not on file  Other Topics Concern   Not on file  Social History Narrative   Not on file   Social Determinants of Health   Financial Resource Strain: Not on file  Food Insecurity: Not on file  Transportation Needs: Not on file  Physical Activity: Not on file  Stress: Not on file  Social Connections: Not on file   Allergies  Allergen Reactions   Adhesive [Tape] Other (See Comments)    Removes skin   Allopurinol Swelling, Hives and Itching   Codeine Nausea Only   Latex     Medications  (Not in a hospital admission)    Vitals   Vitals:   01/01/22 1900 01/01/22 2000 01/01/22 2100 01/01/22 2215  BP: 135/76 126/71 104/70 (!) 144/89  Pulse: 79 82 87 88  Resp: (!) 22 (!) 22 20 (!) 25  Temp:      TempSrc:      SpO2: 95% 96% 98% 97%  Weight:      Height:         Body mass index is 22.29 kg/m.  Physical Exam   General: Laying comfortably in bed; in no acute distress.  HENT: Normal oropharynx and mucosa. Normal external appearance of ears and nose.  Neck: Supple, no pain or tenderness  CV: No JVD. No peripheral edema.  Pulmonary: Symmetric Chest rise. Normal respiratory effort.  Abdomen: Soft to  touch, non-tender.  Ext: No cyanosis, edema, or deformity  Skin: No rash. Normal palpation of skin.  Musculoskeletal: Normal digits and nails by inspection. No clubbing.   Neurologic Examination  Mental status/Cognition: Alert, oriented to self, place, month and year, good attention.  Speech/language: Fluent, comprehension intact, object naming intact, repetition intact.  Cranial nerves:   CN II Pupils equal and reactive to light, no VF deficits    CN III,IV,VI EOM intact, no gaze preference or deviation, no nystagmus    CN V normal sensation in V1, V2, and V3 segments bilaterally    CN VII no asymmetry, no nasolabial fold flattening    CN VIII normal hearing to speech    CN IX & X normal palatal elevation, no uvular deviation    CN XI 5/5 head turn and 5/5 shoulder shrug bilaterally    CN XII midline tongue protrusion    Motor:  Muscle bulk: poor, tone normal, pronator drift yes midl RUE pronator drift. tremor none Mvmt Root Nerve  Muscle Right Left Comments  SA C5/6 Ax Deltoid 4+ 4+   EF C5/6 Mc Biceps 5 5   EE C6/7/8 Rad Triceps 4+ 4+   WF C6/7 Med FCR     WE C7/8 PIN ECU     F Ab C8/T1 U ADM/FDI 4+ 4+   HF L1/2/3 Fem Illopsoas 4+ 4+   KE L2/3/4 Fem Quad 5 5   DF L4/5 D Peron Tib Ant 5 5   PF S1/2 Tibial Grc/Sol 5 5    Reflexes:  Right Left Comments  Pectoralis      Biceps (C5/6)     Brachioradialis (C5/6)      Triceps (C6/7)      Patellar (L3/4)      Achilles (S1)      Hoffman      Plantar     Jaw jerk    Sensation:  Light touch    Pin prick    Temperature    Vibration   Proprioception    Coordination/Complex Motor:  - Finger to Nose intact BL - Heel to shin intact BL - Rapid alternating movement are normal - Gait: deferred.  Labs   CBC:  Recent Labs  Lab 01/01/22 0942  WBC 6.8  NEUTROABS 5.5  HGB 15.9*  HCT 46.9*  MCV 90.4  PLT 650    Basic Metabolic Panel:  Lab Results  Component Value Date   NA 138 01/01/2022   K 4.0 01/01/2022   CO2  28 01/01/2022   GLUCOSE 107 (H) 01/01/2022   BUN 23 01/01/2022   CREATININE 0.82 01/01/2022   CALCIUM 10.4 (H) 01/01/2022   GFRNONAA >60 01/01/2022   GFRAA 53 (L) 08/23/2018   Lipid Panel: No results found for: LDLCALC HgbA1c: No results found for: HGBA1C Urine Drug Screen: No results found for: LABOPIA, COCAINSCRNUR, LABBENZ, AMPHETMU, THCU, LABBARB  Alcohol Level No results found for: [place]  CT Head without contrast(Personally reviewed): CTH was negative for a large hypodensity concerning for a large territory infarct or hyperdensity concerning for an ICH  MR Angio head without contrast and Carotid Duplex BL: pending  MRI Brain(Personally reviewed): Punctate acute cortical infarct within the left parietal lobe.   Two additional punctate foci of diffusion-weighted hyperintensity, one within the medial left frontal lobe (left ACA vascular territory), and the other within the right precentral gyrus (right MCA vascular territory). These foci are appreciated on the axial diffusion-weighted sequence only, and may reflect additional punctate acute infarcts or  image noise artifact.   Mild-to-moderate chronic small vessel ischemic changes within the cerebral white matter.   Impression   Felicia Washington is a 76 y.o. female with PMH significant for hyperlipidemia, hypertension, hypothyroidism, migraine, GERD who presents with an episode of word finding difficulties with numbness in her right hand that resolved. Suspect that her epsiode was probably a stroke/TIA and less likely from UTI. MRI Brain with a punctate acute cortical infarct in left parietal lobe along with 2 additional punctate foci 1 of which is in the medial left frontal lobe and a second 1 is in the right precentral gyrus.  Suspect that she has underlying strokes from an embolic etiology.  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   Secondary Diagnosis: Essential  (primary) hypertension  Recommendations  Plan:  - Frequent Neuro checks per stroke unit protocol - Recommend Vascular imaging with MRA Angio Head without contrast and US Carotid doppler - Recommend obtaining TTE - Recommend obtaining Lipid panel with LDL - Please start statin if LDL > 70 - Recommend HbA1c - Antithrombotic - Aspirin 77m daily along with plavix 767mdaily for 21 days followed by aspirin 8162maily alone. - Recommend DVT ppx - SBP goal - permissive hypertension first 24 h < 220/110. Held home meds.  - Recommend Telemetry monitoring for arrythmia - Recommend bedside swallow screen prior to PO intake. - Stroke education booklet - Recommend PT/OT/SLP consult  ______________________________________________________________________  Plan discussed with Dr. DouRoel Clucker secure chat.  Thank you for the opportunity to take part in the care of this patient. If you have any further questions, please contact the neurology consultation attending.  Signed,  SalEqualityger Number [id number]_ _   _ __   _ __ _ _  __ __   _ __   __ _

## 2022-01-01 NOTE — ED Notes (Signed)
Pt asking about eating. Advised per orders she is npo. Sent provider a message reference pt passing swallow study and advised pt could eat heart healthy

## 2022-01-01 NOTE — Assessment & Plan Note (Signed)
-   Check TSH continue home medications at current dose

## 2022-01-01 NOTE — ED Provider Notes (Signed)
Patient was initially seen by Dr. Dina Rich.  Please see her note.  Patient's MRI does show an acute stroke.  She previously spoke with Dr. Quinn Axe neurology.  I have informed Dr. Quinn Axe that the patient does have an acute stroke.  Neurology service will consult on this patient.  I will consult the medical service for admission and further treatment.   Dorie Rank, MD 01/01/22 1740

## 2022-01-01 NOTE — ED Notes (Signed)
Provided pt with applesauce and water at this time. Family is at bedside/ denies any further needs

## 2022-01-01 NOTE — Subjective & Objective (Signed)
Increqsed confusion LKW last night When daughter called in this AM was too confused Family states she has been having dysuria and increased urination was diagnosed with UTI and treated with amoxicillin finished course of antibiotics with only some improvement she also has been complaining of headache patient herself just stating she feels off but she is unsure which way.  No pain no hematuria per daughter maybe she had had a mild fever this morning.

## 2022-01-01 NOTE — Assessment & Plan Note (Signed)
Check lipid panel ans start statin if need

## 2022-01-01 NOTE — Assessment & Plan Note (Signed)
Allow permissive HTn

## 2022-01-01 NOTE — ED Notes (Signed)
Admit provider in room with pt at this time

## 2022-01-01 NOTE — ED Provider Notes (Signed)
[facility] EMERGENCY DEPARTMENT Provider Note   CSN: [id number] Arrival date & time: 01/01/22  8768     History  Chief Complaint  Patient presents with   Altered Mental Status    Felicia Washington is a 76 y.o. female.  HPI  76 year old female with past medical history of HTN, HLD, migraines presents emergency department with concern for.  Patient is accompanied by her daughter.  Patient reportedly lives at home alone.  2 weeks ago had a urinary tract infection, completed an outpatient course of antibiotics with mild improvement.  Patient was spoken to on the phone last night around 8 PM, mention to sound normal.  When the daughter called her this morning she says that she had confused speech and was altered.  Patient states that she feels off but cannot be specific.  Denies any pain at this time.  Endorses continued urinary frequency and dysuria.  No hematuria.  The daughter states that she felt like she had a fever this morning.  No headache at this time.  No other focal weakness or sensory change.  No noted facial droop.  Home Medications Prior to Admission medications   Medication Sig Start Date End Date Taking? Authorizing Provider  ampicillin (PRINCIPEN) 500 MG capsule Take 500 mg by mouth daily.  03/07/16  Yes [provider]  fenofibrate micronized (LOFIBRA) 67 MG capsule Take 67 mg by mouth daily. 08/08/18  Yes [provider]  levothyroxine (SYNTHROID, LEVOTHROID) 112 MCG tablet Take 112 mcg by mouth daily before breakfast.    Yes [provider]  montelukast (SINGULAIR) 10 MG tablet Take 10 mg by mouth at bedtime.   Yes [provider]  olmesartan (BENICAR) 40 MG tablet Take 40 mg by mouth daily.   Yes [provider]  Omeprazole 20 MG TBDD Take 20 mg by mouth 2 (two) times daily.    Yes [provider]  atorvastatin (LIPITOR) 40 MG tablet Take 40 mg by mouth daily.    [provider]   Cholecalciferol (VITAMIN D3) 25 MCG (1000 UT) CAPS Take 1 capsule by mouth daily.    [provider]  clonazePAM (KLONOPIN) 0.5 MG tablet TK 1 T PO UP TO BID PRA OR MUSCLE SPASM 04/19/19   [provider]  febuxostat (ULORIC) 40 MG tablet Take 40 mg by mouth daily.    [provider]      Allergies    Adhesive [tape], Allopurinol, Codeine, and Latex    Review of Systems   Review of Systems  Constitutional:  Positive for fatigue. Negative for chills and fever.  HENT:  Negative for congestion.   Eyes:  Negative for visual disturbance.  Respiratory:  Negative for shortness of breath.   Cardiovascular:  Negative for chest pain.  Gastrointestinal:  Positive for nausea. Negative for abdominal pain, diarrhea and vomiting.  Genitourinary:  Positive for dysuria and frequency. Negative for flank pain and pelvic pain.  Skin:  Negative for rash.  Neurological:  Negative for headaches.  Psychiatric/Behavioral:  Positive for confusion.    Physical Exam Updated Vital Signs BP (!) 184/102    Pulse (!) 117    Temp 98.8 F (37.1 C) (Oral)    Resp (!) 24    Ht 5' 5.5" (1.664 m)    Wt 61.7 kg    SpO2 95%    BMI 22.29 kg/m  Physical Exam Vitals and nursing note reviewed.  Constitutional:      General: She is  not in acute distress.    Appearance: Normal appearance.  HENT:     Head: Normocephalic.     Mouth/Throat:     Mouth: Mucous membranes are moist.  Eyes:     Pupils: Pupils are equal, round, and reactive to light.  Cardiovascular:     Rate and Rhythm: Tachycardia present.  Pulmonary:     Effort: Pulmonary effort is normal. No respiratory distress.     Breath sounds: No wheezing or rales.  Abdominal:     Palpations: Abdomen is soft.     Tenderness: There is no abdominal tenderness.  Musculoskeletal:        General: No swelling or deformity.     Cervical back: No rigidity.  Skin:    General: Skin is warm.  Neurological:     Mental Status: She is alert. She  is disoriented.  Psychiatric:        Mood and Affect: Mood normal.    ED Results / Procedures / Treatments   Labs (all labs ordered are listed, but only abnormal results are displayed) Labs Reviewed  RESP PANEL BY RT-PCR (FLU A&B, COVID) ARPGX2  CULTURE, BLOOD (ROUTINE X 2)  CULTURE, BLOOD (ROUTINE X 2)  URINE CULTURE  LACTIC ACID, PLASMA  LACTIC ACID, PLASMA  COMPREHENSIVE METABOLIC PANEL  CBC WITH DIFFERENTIAL/PLATELET  PROTIME-INR  APTT  URINALYSIS, ROUTINE W REFLEX MICROSCOPIC    EKG None  Radiology No results found.  Procedures Procedures    Medications Ordered in ED Medications  lactated ringers infusion (has no administration in time range)  lactated ringers bolus 1,000 mL (has no administration in time range)    And  lactated ringers bolus 1,000 mL (has no administration in time range)  labetalol (NORMODYNE) injection 10 mg (10 mg Intravenous Given 01/01/22 6045)    ED Course/ Medical Decision Making/ A&P                           Medical Decision Making   76 year old female presents emergency department with change in mental status, confusion, fatigue and chills.  With recently treated UTI and unresolved symptoms with tachycardia/chills concern for UTI, confusion secondary to infection, sepsis.  Symptoms initially less suspicious in regards to aphasia, TIA, CVA.  This patient presents to the ED for concern of altered mental status, this involves an extensive number of treatment options, and is a complaint that carries with it a high risk of complications and morbidity.  The differential diagnosis includes infection, metabolic encephalopathy, TIA/CVA   Additional history obtained: -Additional history obtained from sisters at bedside -External records from outside source obtained and reviewed including: Chart review including previous notes, labs, imaging, consultation notes   Lab Tests: -I ordered, reviewed, and interpreted labs.  The pertinent results  include: No significant derangements, UA that does not look suspicious for UTI   EKG -Sinus tachycardia   Imaging Studies ordered: -I ordered imaging studies including chest x-ray -I independently visualized and interpreted imaging which showed no pneumonia -I agree with the radiologist interpretation   Medicines ordered and prescription drug management: -I ordered medication including blood pressure control and fluids for tachycardia and potential sepsis -Reevaluation of the patient after these medicines showed that the patient improved -I have reviewed the patients home medicines and have made adjustments as needed   Consultations Obtained: I requested consultation with the neurology, Dr. Quinn Axe,  and discussed lab and imaging findings as well as pertinent plan - they recommend:  MRI   ED Course: 76 year old female presents emergency department with concern for change in mental status, possible ongoing UTI, tachycardia and chills.  Concern for possible sepsis on arrival.  IV hydration initiated.  Blood work is reassuring, no leukocytosis, normal lactic, UA looks reassuring, chest x-ray showed no pneumonia.  Flu and COVID are negative.  On reevaluation patient's speech is more fluent and comfortable however with periods of pauses almost suspicious for aphasia.  No other localizing neuro symptoms.  Last known normal was last night and she is out of the window for any tPA.  Head CT looks unremarkable we will plan to pursue MRI in regards to evaluation for TIA/MRI.  No etiology for patient's confusion/speech changes at this time.  Patient signed out to Dr. Tomi Bamberger pending MRI results.    Cardiac Monitoring: The patient was maintained on a cardiac monitor.  I personally viewed and interpreted the cardiac monitored which showed an underlying rhythm of: sinus tachycardia   Reevaluation: After the interventions noted above, I reevaluated the patient and found that they have  :improved         Final Clinical Impression(s) / ED Diagnoses Final diagnoses:  None    Rx / DC Orders ED Discharge Orders     None         Lorelle Gibbs, DO 01/01/22 1633

## 2022-01-01 NOTE — Assessment & Plan Note (Signed)
glutelin free diet

## 2022-01-02 ENCOUNTER — Inpatient Hospital Stay (HOSPITAL_COMMUNITY): Payer: Medicare Other

## 2022-01-02 DIAGNOSIS — I639 Cerebral infarction, unspecified: Secondary | ICD-10-CM | POA: Diagnosis not present

## 2022-01-02 DIAGNOSIS — I6389 Other cerebral infarction: Secondary | ICD-10-CM | POA: Diagnosis not present

## 2022-01-02 LAB — COMPREHENSIVE METABOLIC PANEL
ALT: 16 U/L (ref 0–44)
AST: 22 U/L (ref 15–41)
Albumin: 3.1 g/dL — ABNORMAL LOW (ref 3.5–5.0)
Alkaline Phosphatase: 42 U/L (ref 38–126)
Anion gap: 6 (ref 5–15)
BUN: 10 mg/dL (ref 8–23)
CO2: 24 mmol/L (ref 22–32)
Calcium: 8.6 mg/dL — ABNORMAL LOW (ref 8.9–10.3)
Chloride: 108 mmol/L (ref 98–111)
Creatinine, Ser: 0.68 mg/dL (ref 0.44–1.00)
GFR, Estimated: >60 mL/min (ref >60)
Glucose, Bld: 124 mg/dL — ABNORMAL HIGH (ref 70–99)
Potassium: 3.3 mmol/L — ABNORMAL LOW (ref 3.5–5.1)
Sodium: 138 mmol/L (ref 135–145)
Total Bilirubin: 0.7 mg/dL (ref 0.3–1.2)
Total Protein: 5.3 g/dL — ABNORMAL LOW (ref 6.5–8.1)

## 2022-01-02 LAB — ECHOCARDIOGRAM COMPLETE
AR max vel: 1.95 cm2
AV Area VTI: 1.96 cm2
AV Area mean vel: 1.88 cm2
AV Mean grad: 4 mmHg
AV Peak grad: 6.2 mmHg
Ao pk vel: 1.24 m/s
Area-P 1/2: 3.6 cm2
Calc EF: 55.3 %
Height: 65.5 in
S' Lateral: 2.5 cm
Single Plane A2C EF: 57.3 %
Single Plane A4C EF: 53.6 %
Weight: 2176.38 oz

## 2022-01-02 LAB — HEMOGLOBIN A1C
Hgb A1c MFr Bld: 4.9 % (ref 4.8–5.6)
Mean Plasma Glucose: 93.93 mg/dL

## 2022-01-02 LAB — URINE CULTURE

## 2022-01-02 LAB — RAPID URINE DRUG SCREEN, HOSP PERFORMED
Amphetamines: NOT DETECTED
Barbiturates: NOT DETECTED
Benzodiazepines: NOT DETECTED
Cocaine: NOT DETECTED
Opiates: NOT DETECTED
Tetrahydrocannabinol: NOT DETECTED

## 2022-01-02 LAB — LIPID PANEL
Cholesterol: 165 mg/dL (ref 0–200)
HDL: 76 mg/dL (ref >40)
LDL Cholesterol: 71 mg/dL (ref 0–99)
Total CHOL/HDL Ratio: 2.2 RATIO
Triglycerides: 90 mg/dL (ref <150)
VLDL: 18 mg/dL (ref 0–40)

## 2022-01-02 LAB — AMMONIA: Ammonia: 29 umol/L (ref 9–35)

## 2022-01-02 MED ORDER — ASPIRIN EC 81 MG PO TBEC
81.0000 mg | DELAYED_RELEASE_TABLET | Freq: Every day | ORAL | Status: DC
  Administered 2022-01-03: 81 mg via ORAL
  Filled 2022-01-02: qty 1

## 2022-01-02 MED ORDER — POTASSIUM CHLORIDE CRYS ER 20 MEQ PO TBCR
40.0000 meq | EXTENDED_RELEASE_TABLET | Freq: Once | ORAL | Status: AC
  Administered 2022-01-02: 40 meq via ORAL
  Filled 2022-01-02: qty 2

## 2022-01-02 NOTE — ED Notes (Signed)
ECHO tech at bedside.

## 2022-01-02 NOTE — Progress Notes (Signed)
Carotid artery duplex completed. Refer to "CV Proc" under chart review to view preliminary results.  01/02/2022 4:55 PM Kelby Aline., MHA, RVT, RDCS, RDMS

## 2022-01-02 NOTE — Consult Note (Signed)
ELECTROPHYSIOLOGY CONSULT NOTE  Patient ID: Felicia Washington MRN: [id number], DOB/AGE: 03-19-1971   Admit date: 01/01/2022 Date of Consult: 01/02/2022  Primary Physician: Reynold Bowen, MD Primary Cardiologist: none Reason for Consultation: Cryptogenic stroke; recommendations regarding Implantable Loop Recorder, requested by Dr. Leonie Man  History of Present Illness Felicia Washington was admitted on 01/01/2022 with word finding difficulties, visual changes and b/l strokes.    PMHx includes:HLD, HTN, hypothyroidism, MHA, and GERD   Neurology notes: Acute punctate cortical infarct in left parietal lobe with additional punctate stroke in medial left frontal lobe and another in right precentral gyrus. 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  Strong suspicion for paroxysmal A. fib.  she has undergone workup for stroke including echocardiogram and carotid dopplers.  The patient has been monitored on telemetry which has demonstrated sinus rhythm with no arrhythmias.     Echocardiogram this admission demonstrated.   IMPRESSIONS   1. Left ventricular ejection fraction, by estimation, is 60 to 65%. The  left ventricle has normal function. The left ventricle has no regional  wall motion abnormalities. Left ventricular diastolic parameters are  consistent with Grade I diastolic  dysfunction (impaired relaxation). Elevated left ventricular end-diastolic  pressure.   2. Right ventricular systolic function is normal. The right ventricular  size is normal. There is mildly elevated pulmonary artery systolic  pressure.   3. Left atrial size was severely dilated.   4. The mitral valve is normal in structure. Trivial mitral valve  regurgitation. No evidence of mitral stenosis.   5. The aortic valve is tricuspid. Aortic valve regurgitation is not  visualized. No aortic stenosis is present.   6. The inferior vena cava is normal in size with greater than 50%  respiratory variability, suggesting right atrial  pressure of 3 mmHg.   Lab work is reviewed.  Prior to admission, the patient denies chest pain, shortness of breath, dizziness, palpitations, or syncope.  They are recovering from their stroke with plans to home at discharge.   Past Medical History:  Diagnosis Date   Anemia    Arthritis    "fingers, shoulders" (09/01/2018)   Asthma    Celiac disease    GERD (gastroesophageal reflux disease)    Graves disease    Heart murmur    History of blood transfusion    "related to c-section"   History of gout    fingers   HLD (hyperlipidemia)    HTN (hypertension)    Hypothyroidism    Migraine    "1 q couple years" (09/01/2018)   Pneumonia    "several times in 1 yr" (09/01/2018)   PONV (postoperative nausea and vomiting)    Seasonal allergies      Surgical History:  Past Surgical History:  Procedure Laterality Date   CESAREAN SECTION  1975   COLONOSCOPY     POLYPECTOMY     REVERSE SHOULDER ARTHROPLASTY Right 09/01/2018   REVERSE SHOULDER ARTHROPLASTY Right 09/01/2018   Procedure: RIGHT REVERSE SHOULDER ARTHROPLASTY;  Surgeon: Justice Britain, MD;  Location: [place];  Service: Orthopedics;  Laterality: Right;   SHOULDER ARTHROSCOPY W/ ROTATOR CUFF REPAIR Bilateral    THYROIDECTOMY     TONSILLECTOMY       Medications Prior to Admission  Medication Sig Dispense Refill Last Dose   ampicillin (PRINCIPEN) 500 MG capsule Take 500 mg by mouth daily.   5 12/31/2021   atorvastatin (LIPITOR) 40 MG tablet Take 40 mg by mouth daily.   12/31/2021   Febuxostat 80  MG TABS Take 80 mg by mouth daily.   12/31/2021   fenofibrate micronized (LOFIBRA) 67 MG capsule Take 67 mg by mouth daily.  5 12/31/2021   levothyroxine (SYNTHROID, LEVOTHROID) 112 MCG tablet Take 224 mcg by mouth daily before breakfast.   01/01/2022   montelukast (SINGULAIR) 10 MG tablet Take 10 mg by mouth at bedtime.   12/31/2021   olmesartan (BENICAR) 40 MG tablet Take 40 mg by mouth daily.   12/31/2021   Omeprazole 20 MG TBDD Take 20 mg by mouth  daily.   12/31/2021    Inpatient Medications:    stroke: mapping our early stages of recovery book   Does not apply Once   aspirin EC  81 mg Oral Daily   clopidogrel  75 mg Oral Daily   fenofibrate  160 mg Oral Daily   levothyroxine  112 mcg Oral QAC breakfast   montelukast  10 mg Oral QHS   omeprazole  20 mg Oral BID    Allergies:  Allergies  Allergen Reactions   Adhesive [Tape] Other (See Comments)    Removes skin   Allopurinol Swelling, Hives and Itching   Codeine Nausea Only   Latex Other (See Comments)    Removes skin    Social History   Socioeconomic History   Marital status: Widowed    Spouse name: Not on file   Number of children: 3   Years of education: Not on file   Highest education level: Not on file  Occupational History   Occupation: sales  Tobacco Use   Smoking status: Never   Smokeless tobacco: Never  Vaping Use   Vaping Use: Never used  Substance and Sexual Activity   Alcohol use: No    Alcohol/week: 0.0 standard drinks   Drug use: No   Sexual activity: Not on file  Other Topics Concern   Not on file  Social History Narrative   Not on file   Social Determinants of Health   Financial Resource Strain: Not on file  Food Insecurity: Not on file  Transportation Needs: Not on file  Physical Activity: Not on file  Stress: Not on file  Social Connections: Not on file  Intimate Partner Violence: Not on file     Family History  Problem Relation Age of Onset   Multiple myeloma Father    Bone cancer Father    Celiac disease Sister    Colon polyps Sister    Thyroid disease Mother    Gallstones Mother    Heart attack Maternal Grandfather    Colon polyps Brother    Diabetes Maternal Grandmother       Review of Systems: All other systems reviewed and are otherwise negative except as noted above.  Physical Exam: Vitals:   01/02/22 1200 01/02/22 1332 01/02/22 1444 01/02/22 1504  BP: (!) 176/96 (!) 162/96  (!) 158/67  Pulse: 87 80  79   Resp: (!) '24 14  16  ' Temp:   98 F (36.7 C) 98 F (36.7 C)  TempSrc:    Oral  SpO2: 100% 99%  100%  Weight:      Height:        GEN- The patient is well appearing, alert and oriented x 3 today.   Head- normocephalic, atraumatic Eyes-  Sclera clear, conjunctiva pink Ears- hearing intact Oropharynx- clear Neck- supple Lungs- Clear to ausculation bilaterally, normal work of breathing Heart- RRR , no murmurs, rubs or gallops  GI- soft, NT, ND Extremities- no clubbing, cyanosis,  or edema MS- no significant deformity or atrophy Skin- no rash or lesion Psych- euthymic mood, full affect   Labs:   Lab Results  Component Value Date   WBC 6.8 01/01/2022   HGB 15.9 (H) 01/01/2022   HCT 46.9 (H) 01/01/2022   MCV 90.4 01/01/2022   PLT 190 01/01/2022    Recent Labs  Lab 01/02/22 1016  NA 138  K 3.3*  CL 108  CO2 24  BUN 10  CREATININE 0.68  CALCIUM 8.6*  PROT 5.3*  BILITOT 0.7  ALKPHOS 42  ALT 16  AST 22  GLUCOSE 124*   No results found for: CKTOTAL, CKMB, CKMBINDEX, TROPONINI Lab Results  Component Value Date   CHOL 165 01/02/2022   Lab Results  Component Value Date   HDL 76 01/02/2022   Lab Results  Component Value Date   LDLCALC 71 01/02/2022   Lab Results  Component Value Date   TRIG 90 01/02/2022   Lab Results  Component Value Date   CHOLHDL 2.2 01/02/2022   No results found for: LDLDIRECT  No results found for: DDIMER   Radiology/Studies:  CT Head Wo Contrast Result Date: 01/01/2022 CLINICAL DATA:  Mental status change. Increased confusion. Headache. EXAM: CT HEAD WITHOUT CONTRAST TECHNIQUE: Contiguous axial images were obtained from the base of the skull through the vertex without intravenous contrast. COMPARISON:  None. FINDINGS: Brain: There is no evidence of an acute infarct, intracranial hemorrhage, mass, midline shift, or extra-axial fluid collection. Mild cerebral atrophy is within normal limits for age. Patchy hypodensities in the cerebral  white matter bilaterally are nonspecific but compatible with mild-to-moderate chronic small vessel ischemic disease. Vascular: Calcified atherosclerosis at the skull base. No hyperdense vessel. Skull: No acute fracture or suspicious osseous lesion. Sinuses/Orbits: Paranasal sinuses and mastoid air cells are clear. Unremarkable orbits. Other: None. IMPRESSION: 1. No evidence of acute intracranial abnormality. 2. Mild-to-moderate chronic small vessel ischemic disease. Electronically Signed   By: Logan Bores M.D.   On: 01/01/2022 13:01   MR ANGIO HEAD WO CONTRAST Result Date: 01/02/2022 CLINICAL DATA:  76 year old female with neurologic deficit. Several scattered punctate bilateral MCA infarcts on MRI yesterday. EXAM: MRA HEAD WITHOUT CONTRAST TECHNIQUE: Angiographic images of the Circle of Willis were acquired using MRA technique without intravenous contrast. COMPARISON:  Brain MRI 01/01/2022. FINDINGS: Posterior circulation: Antegrade flow in the distal vertebral arteries, the right appears mildly dominant. Normal PICA origins and vertebrobasilar junction. No distal vertebral or basilar artery stenosis. Tortuous basilar. SCA and PCA origins are patent with right posterior communicating artery and tortuous left PCA P1 segment. Bilateral PCA branches are within normal limits. Anterior circulation: Antegrade flow in both ICA siphons. No siphon stenosis. Right posterior communicating artery and bilateral ophthalmic artery origins appear normal. Patent carotid termini. Patent MCA and ACA origins. Tortuous right ACA A1 segment with probable fenestration of the anterior communicating artery (normal variant). Visible bilateral ACA branches are within normal limits. MCA M1 segments and MCA bifurcations appear patent without stenosis. Visible bilateral MCA branches are within normal limits. Anatomic variants: Fenestrated anterior communicating arteries suspected. Other: No intracranial mass effect or ventriculomegaly.  IMPRESSION: Negative intracranial MRA. Electronically Signed   By: Genevie Ann M.D.   On: 01/02/2022 05:53   MR rain Wo Contrast (neuro protocol) Result Date: 01/01/2022 CLINICAL DATA:  Provided history: Neuro deficit, acute, stroke suspected; speech change. EXAM: MRI HEAD WITHOUT CONTRAST TECHNIQUE: Multiplanar, multiecho pulse sequences of the brain and surrounding structures were obtained without intravenous contrast. COMPARISON:  Head CT 01/01/2022. FINDINGS: Brain: Mild intermittent motion degradation. Mild generalized cerebral and cerebellar atrophy. Punctate acute cortical infarct within the left parietal lobe (series 3, image 37). Two additional punctate foci of diffusion weighted hyperintensity, one within the medial left frontal lobe (series 3, image 39), and the other within the right precentral gyrus (series 3, image 40). These foci are appreciated only on the axial diffusion-weighted sequence, and may reflect additional punctate acute infarcts or image noise artifact. Background mild-to-moderate multifocal T2 FLAIR hyperintense signal abnormality within the cerebral white matter, nonspecific but compatible with chronic small vessel ischemic disease. No evidence of an intracranial mass. No chronic intracranial blood products. No extra-axial fluid collection. No midline shift. Vascular: Maintained flow voids within the proximal large arterial vessels. Skull and upper cervical spine: No focal suspicious marrow lesion. Sinuses/Orbits: Visualized orbits show no acute finding. Minimal mucosal thickening within the bilateral ethmoid and maxillary sinuses. IMPRESSION: Punctate acute cortical infarct within the left parietal lobe. Two additional punctate foci of diffusion-weighted hyperintensity, one within the medial left frontal lobe (left ACA vascular territory), and the other within the right precentral gyrus (right MCA vascular territory). These foci are appreciated on the axial diffusion-weighted sequence  only, and may reflect additional punctate acute infarcts or image noise artifact. Mild-to-moderate chronic small vessel ischemic changes within the cerebral white matter. Mild generalized parenchymal atrophy. Electronically Signed   By: Kellie Simmering D.O.   On: 01/01/2022 16:40   US RENAL Result Date: 12/28/2021 CLINICAL DATA:  Initial evaluation for chronic kidney disease. EXAM: RENAL / URINARY TRACT ULTRASOUND COMPLETE COMPARISON:  Ultrasound from 08/13/2016. FINDINGS: Right Kidney: Renal measurements: 11.1 x 4.3 x 3.7 cm = volume: 92.3 mL. Renal echogenicity within normal limits. No nephrolithiasis or hydronephrosis. Small extrarenal pelvis noted. No focal renal mass. Left Kidney: Renal measurements: 11.9 x 4.4 x 4.6 cm = volume: 125.2 mL. Renal echogenicity within normal limits. No nephrolithiasis or hydronephrosis. Simple cyst positioned at the upper pole measures up to 4.3 cm in size. The adjacent simple cyst measuring up to 1.9 cm present at the mid-upper pole. 2.2 cm simple cyst present at the lower pole. Bladder: Appears normal for degree of bladder distention. Other: None. IMPRESSION: 1. Renal echogenicity within normal limits. No nephrolithiasis or hydronephrosis. 2. Multiple simple left renal cysts measuring up to 4.3 cm as above. Electronically Signed   By: Jeannine Boga M.D.   On: 12/28/2021 05:32   DG Chest Port 1 View Result Date: 01/01/2022 CLINICAL DATA:  Questionable sepsis, altered mental status EXAM: PORTABLE CHEST 1 VIEW COMPARISON:  09/27/2019 FINDINGS: Cardiomegaly. Hiatal hernia. Both lungs are clear. Right shoulder reverse total arthroplasty. IMPRESSION: 1. Cardiomegaly without acute abnormality of the lungs in AP portable projection. 2.  Hiatal hernia. Electronically Signed   By: Delanna Ahmadi M.D.   On: 01/01/2022 10:00    12-lead ECG SR All prior EKG's in EPIC reviewed with no documented atrial fibrillation  Telemetry SR  Assessment and Plan:  1. Cryptogenic  stroke The patient presents with cryptogenic stroke.  Neurology has deferred TEE.  Dr. Quentin Ore spoke at length with the patient about monitoring for afib with either a 30 day event monitor or an implantable loop recorder.  Risks, benefits, and alteratives to implantable loop recorder were discussed with the patient today.   At this time, the patient is very clear in their decision at least for now NOT  to proceed with implantable loop recorder.   EP follow up is in place to  revisit monitoring with her in the office.   Corinda Ammon Dyane Dustman, PA-C 01/02/2022

## 2022-01-02 NOTE — Progress Notes (Signed)
OT Cancellation Note  Patient Details Name: Felicia Washington MRN: [id number] DOB: April 09, 1946   Cancelled Treatment:    Reason Eval/Treat Not Completed: Patient at procedure or test/ unavailable (provider in room and imaging waiting outside the room) OT will continue to follow for evaluation as schedule allows.  [place] 01/02/2022, 9:21 AM  Jesse Sans OTR/L Acute Rehabilitation Services Pager: [phone] Office: [phone]

## 2022-01-02 NOTE — Progress Notes (Signed)
PROGRESS NOTE    Felicia Washington  PTW:[id number] DOB: 26-Sep-1946 DOA: 01/01/2022 PCP: Reynold Bowen, MD   Brief Narrative:  Felicia Washington is a 76 y.o. female with PMH significant for hyperlipidemia, hypertension, hypothyroidism, migraine, GERD who presented with an episode of word finding difficulties with numbness in her right hand that resolved.  She was also recently treated for UTI with amoxicillin.  Patient reports that she went to bed at 2030 on 12/31/2021 and woke up at 0530 on 01/01/2022 with finding the right word.  She reports that she had trouble with her vision also and she could not see her coffee cup her coffee maker.  She tried to call her daughter and spoke to her daughter but kept repeating the same phrase multiple times.  At times her speech seemed like it was more of a word salad.   Daughter called EMS and her symptoms had significantly improved by the time EMS got there and she was brought into the ER for further evaluation and work-up.   She endorses history of hypertension and hyperlipidemia and is unclear how well her blood pressure is controlled.  She does not monitor her blood pressure at home.  She denies any prior history of strokes.  Endorses that her sister had a stroke.   She had workup with MRI Brain without contrast which demonstrated an acute punctate cortical infarct in the left parietal lobe along with an additional punctate stroke in the medial left frontal lobe and another in the right precentral gyrus.    Assessment & Plan:   Principal Problem:   CVA (cerebral vascular accident) ([place]) Active Problems:   Hypothyroidism   Hypercholesteremia   Essential hypertension   Celiac disease   Asthma   Cardiomegaly   Acute lower UTI   Acute metabolic encephalopathy  Acute ischemic/embolic CVA: MRI brain with punctate acute cortical infarct in left parietal lobe along with 2 additional punctate foci, 1 of which is in medial left frontal lobe and a second is in  right precentral gyrus, suspected embolic etiology.  MRA head negative for large vessel obstruction.  Doppler carotid pending.  Patient symptoms have totally resolved.  She has fluent speech and no more numbness and has equal and normal power in all 4 extremities.  PT OT and SLP consulted.  Transthoracic echo completed, results pending.  On DAPT.  Further management per neurology.  Allow permissive hypertension.  She is on fenofibrate now.  Will likely need event monitor.  Hypokalemia: We will replace.  Acute metabolic encephalopathy: Reportedly she was confused when assessed by admitting hospitalist however on my evaluation, she is fully alert and oriented.  This could be due to her stroke.  Essential hypertension: Takes olmesartan at home, blood pressure elevated, will allow permissive hypertension and continue to hold antihypertensives.  Hypothyroidism: Continue Synthroid.  Mild intermittent asthma: Stable.  Continue Singulair.  GERD: Continue PPI.  Asymptomatic bacteriuria: Patient was diagnosed with UTI by admitting hospitalist however patient denies any urinary symptoms, she is afebrile, no leukocytosis, no nitrites or leukoesterase on the UA, no indication that she has UTI, and states she has asymptomatic bacteriuria, will discontinue Rocephin.  DVT prophylaxis: SCD's Start: 01/01/22 2008   Code Status: Full Code  Family Communication: Her twin sister present at bedside.  Plan of care discussed with patient in length and he verbalized understanding and agreed with it.  Status is: Inpatient  Remains inpatient appropriate because: Needs further work-up of stroke.   Estimated body mass index  is 22.29 kg/m as calculated from the following:   Height as of this encounter: 5' 5.5" (1.664 m).   Weight as of this encounter: 61.7 kg.     Nutritional Assessment: Body mass index is 22.29 kg/m.Marland Kitchen Seen by dietician.  I agree with the assessment and plan as outlined below: Nutrition  Status:        .  Skin Assessment: I have examined the patient's skin and I agree with the wound assessment as performed by the wound care RN as outlined below:    Consultants:  Neurology  Procedures:  None  Antimicrobials:  Anti-infectives (From admission, onward)    Start     Dose/Rate Route Frequency Ordered Stop   01/01/22 2300  cefTRIAXone (ROCEPHIN) 1 g in sodium chloride 0.9 % 100 mL IVPB  Status:  Discontinued        1 g 200 mL/hr over 30 Minutes Intravenous Every 24 hours 01/01/22 2246 01/02/22 0918          Subjective: Seen and examined.  She has no complaints.  Her sister is at the bedside.  Objective: Vitals:   01/02/22 0930 01/02/22 1000 01/02/22 1030 01/02/22 1141  BP: (!) 148/83 (!) 154/84 (!) 151/86 (!) 164/95  Pulse: 85 84 85 80  Resp: 18 (!) 23 (!) 26 14  Temp:      TempSrc:      SpO2: 99% 97% 98% 99%  Weight:      Height:        Intake/Output Summary (Last 24 hours) at 01/02/2022 1211 Last data filed at 01/02/2022 0914 Gross per 24 hour  Intake 1100 ml  Output 1700 ml  Net -600 ml   Filed Weights   01/01/22 0844  Weight: 61.7 kg    Examination:  General exam: Appears calm and comfortable  Respiratory system: Clear to auscultation. Respiratory effort normal. Cardiovascular system: S1 & S2 heard, RRR. No JVD, murmurs, rubs, gallops or clicks. No pedal edema. Gastrointestinal system: Abdomen is nondistended, soft and nontender. No organomegaly or masses felt. Normal bowel sounds heard. Central nervous system: Alert and oriented. No focal neurological deficits.  Speech is fluent. Extremities: Symmetric 5 x 5 power. Skin: No rashes, lesions or ulcers Psychiatry: Judgement and insight appear normal. Mood & affect appropriate.    Data Reviewed: I have personally reviewed following labs and imaging studies  CBC: Recent Labs  Lab 01/01/22 0942  WBC 6.8  NEUTROABS 5.5  HGB 15.9*  HCT 46.9*  MCV 90.4  PLT 154   Basic Metabolic  Panel: Recent Labs  Lab 01/01/22 0942 01/02/22 1016  NA 138 138  K 4.0 3.3*  CL 99 108  CO2 28 24  GLUCOSE 107* 124*  BUN 23 10  CREATININE 0.82 0.68  CALCIUM 10.4* 8.6*   GFR: Estimated Creatinine Clearance: 57.5 mL/min (by C-G formula based on SCr of 0.68 mg/dL). Liver Function Tests: Recent Labs  Lab 01/01/22 0942 01/02/22 1016  AST 29 22  ALT 25 16  ALKPHOS 61 42  BILITOT 0.9 0.7  PROT 7.2 5.3*  ALBUMIN 4.4 3.1*   No results for input(s): LIPASE, AMYLASE in the last 168 hours. Recent Labs  Lab 01/02/22 0233  AMMONIA 29   Coagulation Profile: Recent Labs  Lab 01/01/22 0942  INR 0.9   Cardiac Enzymes: No results for input(s): CKTOTAL, CKMB, CKMBINDEX, TROPONINI in the last 168 hours. BNP (last 3 results) No results for input(s): PROBNP in the last 8760 hours. HbA1C: Recent Labs  01/02/22 0500  HGBA1C 4.9   CBG: No results for input(s): GLUCAP in the last 168 hours. Lipid Profile: Recent Labs    01/02/22 0500  CHOL 165  HDL 76  LDLCALC 71  TRIG 90  CHOLHDL 2.2   Thyroid Function Tests: No results for input(s): TSH, T4TOTAL, FREET4, T3FREE, THYROIDAB in the last 72 hours. Anemia Panel: No results for input(s): VITAMINB12, FOLATE, FERRITIN, TIBC, IRON, RETICCTPCT in the last 72 hours. Sepsis Labs: Recent Labs  Lab 01/01/22 0254  LATICACIDVEN 1.3    Recent Results (from the past 240 hour(s))  Resp Panel by RT-PCR (Flu A&B, Covid) Nasopharyngeal Swab     Status: None   Collection Time: 01/01/22  9:42 AM   Specimen: Nasopharyngeal Swab; Nasopharyngeal(NP) swabs in vial transport medium  Result Value Ref Range Status   SARS Coronavirus 2 by RT PCR NEGATIVE NEGATIVE Final    Comment: (NOTE) SARS-CoV-2 target nucleic acids are NOT DETECTED.  The SARS-CoV-2 RNA is generally detectable in upper respiratory specimens during the acute phase of infection. The lowest concentration of SARS-CoV-2 viral copies this assay can detect is 138  copies/mL. A negative result does not preclude SARS-Cov-2 infection and should not be used as the sole basis for treatment or other patient management decisions. A negative result may occur with  improper specimen collection/handling, submission of specimen other than nasopharyngeal swab, presence of viral mutation(s) within the areas targeted by this assay, and inadequate number of viral copies(<138 copies/mL). A negative result must be combined with clinical observations, patient history, and epidemiological information. The expected result is Negative.  Fact Sheet for Patients:  EntrepreneurPulse.com.au  Fact Sheet for Healthcare Providers:  IncredibleEmployment.be  This test is no t yet approved or cleared by the [place] FDA and  has been authorized for detection and/or diagnosis of SARS-CoV-2 by FDA under an Emergency Use Authorization (EUA). This EUA will remain  in effect (meaning this test can be used) for the duration of the COVID-19 declaration under Section 564(b)(1) of the Act, 21 U.S.C.section 360bbb-3(b)(1), unless the authorization is terminated  or revoked sooner.       Influenza A by PCR NEGATIVE NEGATIVE Final   Influenza B by PCR NEGATIVE NEGATIVE Final    Comment: (NOTE) The Xpert Xpress SARS-CoV-2/FLU/RSV plus assay is intended as an aid in the diagnosis of influenza from Nasopharyngeal swab specimens and should not be used as a sole basis for treatment. Nasal washings and aspirates are unacceptable for Xpert Xpress SARS-CoV-2/FLU/RSV testing.  Fact Sheet for Patients: EntrepreneurPulse.com.au  Fact Sheet for Healthcare Providers: IncredibleEmployment.be  This test is not yet approved or cleared by the [place] FDA and has been authorized for detection and/or diagnosis of SARS-CoV-2 by FDA under an Emergency Use Authorization (EUA). This EUA will remain in effect (meaning  this test can be used) for the duration of the COVID-19 declaration under Section 564(b)(1) of the Act, 21 U.S.C. section 360bbb-3(b)(1), unless the authorization is terminated or revoked.  Performed at [facility], [place] [address] [place], [place] [address]   Blood Culture (routine x 2)     Status: None (Preliminary result)   Collection Time: 01/01/22  9:42 AM   Specimen: BLOOD  Result Value Ref Range Status   Specimen Description BLOOD LEFT ANTECUBITAL  Final   Special Requests   Final    BOTTLES DRAWN AEROBIC AND ANAEROBIC Blood Culture results may not be optimal due to an inadequate volume of blood received in culture bottles  Culture   Final    NO GROWTH < 24 HOURS Performed at [facility], [place] [address] [place], [place] [address]    Report Status PENDING  Incomplete  Blood Culture (routine x 2)     Status: None (Preliminary result)   Collection Time: 01/01/22  9:42 AM   Specimen: BLOOD  Result Value Ref Range Status   Specimen Description BLOOD RIGHT ANTECUBITAL  Final   Special Requests   Final    BOTTLES DRAWN AEROBIC AND ANAEROBIC Blood Culture results may not be optimal due to an inadequate volume of blood received in culture bottles   Culture   Final    NO GROWTH < 24 HOURS Performed at [facility], [place] [address], [place], [place] [address]    Report Status PENDING  Incomplete  Urine Culture     Status: Abnormal   Collection Time: 01/01/22 11:33 AM   Specimen: In/Out Cath Urine  Result Value Ref Range Status   Specimen Description IN/OUT CATH URINE  Final   Special Requests   Final    NONE Performed at [facility], [place] [address] [place], [place] [address]    Culture MULTIPLE SPECIES PRESENT, SUGGEST RECOLLECTION (A)  Final   Report Status 01/02/2022 FINAL  Final      Radiology Studies: CT Head Wo Contrast  Result Date: 01/01/2022 CLINICAL DATA:  Mental status change. Increased confusion. Headache. EXAM: CT HEAD WITHOUT  CONTRAST TECHNIQUE: Contiguous axial images were obtained from the base of the skull through the vertex without intravenous contrast. COMPARISON:  None. FINDINGS: Brain: There is no evidence of an acute infarct, intracranial hemorrhage, mass, midline shift, or extra-axial fluid collection. Mild cerebral atrophy is within normal limits for age. Patchy hypodensities in the cerebral white matter bilaterally are nonspecific but compatible with mild-to-moderate chronic small vessel ischemic disease. Vascular: Calcified atherosclerosis at the skull base. No hyperdense vessel. Skull: No acute fracture or suspicious osseous lesion. Sinuses/Orbits: Paranasal sinuses and mastoid air cells are clear. Unremarkable orbits. Other: None. IMPRESSION: 1. No evidence of acute intracranial abnormality. 2. Mild-to-moderate chronic small vessel ischemic disease. Electronically Signed   By: Logan Bores M.D.   On: 01/01/2022 13:01   MR ANGIO HEAD WO CONTRAST  Result Date: 01/02/2022 CLINICAL DATA:  76 year old female with neurologic deficit. Several scattered punctate bilateral MCA infarcts on MRI yesterday. EXAM: MRA HEAD WITHOUT CONTRAST TECHNIQUE: Angiographic images of the Circle of Willis were acquired using MRA technique without intravenous contrast. COMPARISON:  Brain MRI 01/01/2022. FINDINGS: Posterior circulation: Antegrade flow in the distal vertebral arteries, the right appears mildly dominant. Normal PICA origins and vertebrobasilar junction. No distal vertebral or basilar artery stenosis. Tortuous basilar. SCA and PCA origins are patent with right posterior communicating artery and tortuous left PCA P1 segment. Bilateral PCA branches are within normal limits. Anterior circulation: Antegrade flow in both ICA siphons. No siphon stenosis. Right posterior communicating artery and bilateral ophthalmic artery origins appear normal. Patent carotid termini. Patent MCA and ACA origins. Tortuous right ACA A1 segment with probable  fenestration of the anterior communicating artery (normal variant). Visible bilateral ACA branches are within normal limits. MCA M1 segments and MCA bifurcations appear patent without stenosis. Visible bilateral MCA branches are within normal limits. Anatomic variants: Fenestrated anterior communicating arteries suspected. Other: No intracranial mass effect or ventriculomegaly. IMPRESSION: Negative intracranial MRA. Electronically Signed   By: Genevie Ann M.D.   On: 01/02/2022 05:53   MR Brain Wo Contrast (neuro  protocol)  Result Date: 01/01/2022 CLINICAL DATA:  Provided history: Neuro deficit, acute, stroke suspected; speech change. EXAM: MRI HEAD WITHOUT CONTRAST TECHNIQUE: Multiplanar, multiecho pulse sequences of the brain and surrounding structures were obtained without intravenous contrast. COMPARISON:  Head CT 01/01/2022. FINDINGS: Brain: Mild intermittent motion degradation. Mild generalized cerebral and cerebellar atrophy. Punctate acute cortical infarct within the left parietal lobe (series 3, image 37). Two additional punctate foci of diffusion weighted hyperintensity, one within the medial left frontal lobe (series 3, image 39), and the other within the right precentral gyrus (series 3, image 40). These foci are appreciated only on the axial diffusion-weighted sequence, and may reflect additional punctate acute infarcts or image noise artifact. Background mild-to-moderate multifocal T2 FLAIR hyperintense signal abnormality within the cerebral white matter, nonspecific but compatible with chronic small vessel ischemic disease. No evidence of an intracranial mass. No chronic intracranial blood products. No extra-axial fluid collection. No midline shift. Vascular: Maintained flow voids within the proximal large arterial vessels. Skull and upper cervical spine: No focal suspicious marrow lesion. Sinuses/Orbits: Visualized orbits show no acute finding. Minimal mucosal thickening within the bilateral ethmoid  and maxillary sinuses. IMPRESSION: Punctate acute cortical infarct within the left parietal lobe. Two additional punctate foci of diffusion-weighted hyperintensity, one within the medial left frontal lobe (left ACA vascular territory), and the other within the right precentral gyrus (right MCA vascular territory). These foci are appreciated on the axial diffusion-weighted sequence only, and may reflect additional punctate acute infarcts or image noise artifact. Mild-to-moderate chronic small vessel ischemic changes within the cerebral white matter. Mild generalized parenchymal atrophy. Electronically Signed   By: Kellie Simmering D.O.   On: 01/01/2022 16:40   DG Chest Port 1 View  Result Date: 01/01/2022 CLINICAL DATA:  Questionable sepsis, altered mental status EXAM: PORTABLE CHEST 1 VIEW COMPARISON:  09/27/2019 FINDINGS: Cardiomegaly. Hiatal hernia. Both lungs are clear. Right shoulder reverse total arthroplasty. IMPRESSION: 1. Cardiomegaly without acute abnormality of the lungs in AP portable projection. 2.  Hiatal hernia. Electronically Signed   By: Delanna Ahmadi M.D.   On: 01/01/2022 10:00    Scheduled Meds:   stroke: mapping our early stages of recovery book   Does not apply Once   aspirin  300 mg Rectal Daily   Or   aspirin  325 mg Oral Daily   clopidogrel  75 mg Oral Daily   fenofibrate  160 mg Oral Daily   levothyroxine  112 mcg Oral QAC breakfast   montelukast  10 mg Oral QHS   omeprazole  20 mg Oral BID   Continuous Infusions:  sodium chloride 75 mL/hr at 01/02/22 0825     LOS: 1 day   Time spent: 35 minutes   Darliss Cheney, MD [facility]  01/02/2022, 12:11 PM  Please page via [facility] and do not message via secure chat for anything urgent. Secure chat can be used for anything non urgent.  How to contact the [facility] Attending or Consulting provider [place] or covering provider during after hours [place], for this patient?  Check the care team in [facility] and look for a)  attending/consulting [facility] provider listed and b) the [facility] team listed. Page or secure chat [phone]. Log into [email or web address] and use [place]'s universal password to access. If you do not have the password, please contact the hospital operator. Locate the [facility] provider you are looking for under [facility] and page to a number that you can be directly reached. If you still have  difficulty reaching the provider, please page the [facility] (Director on Call) for the Hospitalists listed on amion for assistance.

## 2022-01-02 NOTE — Evaluation (Signed)
Occupational Therapy Evaluation Patient Details Name: Felicia Washington MRN: [id number] DOB: [birth year] Today's Date: 01/02/2022   History of Present Illness Felicia Washington is a 76 y.o. female admitted 01/01/22 with episode of word finding difficulties, vision deficits, and with numbness in her right hand. 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 PMH significant for hyperlipidemia, hypertension, hypothyroidism, migraine, GERD   Clinical Impression   Pt is typically independent in ADL and mobility, enjoys her adult grandchildren. Today Pt is able to demonstrate LB dressing via figure 4 method, MMT indicated [facility] including R hand. Vision functionally tested [facility] and Pt with no problems communicating. Biggest deficit was when Pt went to stand up initially from ED stretcher, she immediately lost balance and "fell" back onto the stretcher (in seated position) and required support/additional balance from back of legs braced on the stretcher, very small shaky steps. OT will follow acutely but not recommending OT follow up at this time. We will follow acutely, and if she remains in care we will further assess cognition with pill box test and continue to work on transfers for functional ADL.       Recommendations for follow up therapy are one component of a multi-disciplinary discharge planning process, led by the attending physician.  Recommendations may be updated based on patient status, additional functional criteria and insurance authorization.   Follow Up Recommendations  No OT follow up    Assistance Recommended at Discharge Frequent or constant Supervision/Assistance (initially)  Patient can return home with the following A little help with walking and/or transfers;A little help with bathing/dressing/bathroom;Assistance with  cooking/housework;Help with stairs or ramp for entrance    Functional Status Assessment  Patient has had a recent decline in their functional status and demonstrates the ability to make significant improvements in function in a reasonable and predictable amount of time.  Equipment Recommendations  Other (comment) (RW)    Recommendations for Other Services PT consult (prior to DC)     Precautions / Restrictions Precautions Precautions: Fall Restrictions Weight Bearing Restrictions: No      Mobility Bed Mobility Overal bed mobility: Modified Independent                  Transfers Overall transfer level: Needs assistance Equipment used: None;1 person hand held assist Transfers: Sit to/from Stand Sit to Stand: Min guard;Min assist;From elevated surface           General transfer comment: Pt stood x 3 from ED stretcher with one initial LOB backwards resulting in sitting back on stretcher, Pt attributes this to benig in bed for the past 3 days, however was dependent on support on the back of her legs for sit<>stand      Balance Overall balance assessment: Needs assistance Sitting-balance support: No upper extremity supported;Feet unsupported Sitting balance-Leahy Scale: Good Sitting balance - Comments: able to perform dynamic seated balance to don socks Postural control: Posterior lean Standing balance support: Single extremity supported;No upper extremity supported                               ADL either performed or assessed with clinical judgement   ADL Overall ADL's : Needs assistance/impaired Eating/Feeding: Independent   Grooming: Supervision/safety   Upper Body Bathing: Supervision/ safety   Lower Body  Bathing: Min guard   Upper Body Dressing : Set up   Lower Body Dressing: Supervision/safety   Toilet Transfer: Min guard;Minimal assistance   Toileting- Water quality scientist and Hygiene: Min guard   Tub/ Shower Transfer: Min guard    Functional mobility during ADLs: Min guard;Minimal assistance General ADL Comments: main deficits for ADL linked to transfers and mobility. poor standing balance for grooming tasks, little insights into deficits     Vision Baseline Vision/History: 0 No visual deficits Ability to See in Adequate Light: 0 Adequate Patient Visual Report: No change from baseline Vision Assessment?: Yes Eye Alignment: Within Functional Limits Ocular Range of Motion: Within Functional Limits Alignment/Gaze Preference: Within Defined Limits Tracking/Visual Pursuits: Able to track stimulus in all quads without difficulty Convergence: Within functional limits Visual Fields: No apparent deficits Additional Comments: able to read distance and close     Perception     Praxis      Pertinent Vitals/Pain Pain Assessment: No/denies pain     Hand Dominance Right   Extremity/Trunk Assessment Upper Extremity Assessment Upper Extremity Assessment: Generalized weakness (hx of R TSA)   Lower Extremity Assessment Lower Extremity Assessment: Defer to PT evaluation   Cervical / Trunk Assessment Cervical / Trunk Assessment: Normal   Communication Communication Communication: No difficulties   Cognition Arousal/Alertness: Awake/alert Behavior During Therapy: WFL for tasks assessed/performed Overall Cognitive Status: Impaired/Different from baseline Area of Impairment: Safety/judgement                         Safety/Judgement: Decreased awareness of safety;Decreased awareness of deficits     General Comments: Pt had several LOB with me during in room mobility. Pt attributed to "being laying down for 3 days"     General Comments  Daughter present throughout assessment    Exercises     Shoulder Instructions      Home Living Family/patient expects to be discharged to:: Private residence Living Arrangements: Alone Available Help at Discharge: Family;Available 24 hours/day Type of Home:  House Home Access: Stairs to enter [employer] of Steps: 2 Entrance Stairs-Rails: None Home Layout: One level     Bathroom Shower/Tub: Walk-in shower;Tub/shower unit   Bathroom Toilet: Handicapped height     Home Equipment: None          Prior Functioning/Environment Prior Level of Function : Independent/Modified Independent                        OT Problem List: Decreased activity tolerance;Impaired balance (sitting and/or standing);Decreased safety awareness      OT Treatment/Interventions: Self-care/ADL training;DME and/or AE instruction;Therapeutic activities;Patient/family education;Balance training    OT Goals(Current goals can be found in the care plan section) Acute Rehab OT Goals Patient Stated Goal: get home, get a shower OT Goal Formulation: With patient/family Time For Goal Achievement: 01/16/22 Potential to Achieve Goals: Good ADL Goals Pt Will Perform Grooming: Independently;standing Pt Will Perform Upper Body Dressing: Independently;standing Pt Will Perform Lower Body Dressing: sit to/from stand;with modified independence Pt Will Transfer to Toilet: with modified independence;ambulating Pt Will Perform Toileting - Clothing Manipulation and hygiene: with modified independence;sit to/from stand Additional ADL Goal #1: Pt will demonstrate cognitive capacity for medicine management by passing pill box assessment  OT Frequency: Min 2X/week    Co-evaluation              AM-PAC OT "6 Clicks" Daily Activity     Outcome Measure Help from another person  eating meals?: None Help from another person taking care of personal grooming?: A Little Help from another person toileting, which includes using toliet, bedpan, or urinal?: A Little Help from another person bathing (including washing, rinsing, drying)?: A Little Help from another person to put on and taking off regular upper body clothing?: None Help from another person to put on and  taking off regular lower body clothing?: A Little 6 Click Score: 20   End of Session Equipment Utilized During Treatment: Gait belt Nurse Communication: Mobility status;Precautions  Activity Tolerance: Patient tolerated treatment well Patient left: with call bell/phone within reach;with family/visitor present;Other (comment) (supine in ED stretcher)  OT Visit Diagnosis: Unsteadiness on feet (R26.81);Muscle weakness (generalized) (M62.81);Other symptoms and signs involving cognitive function                Time: 1031-1059 OT Time Calculation (min): 28 min Charges:  OT General Charges $OT Visit: 1 Visit OT Evaluation $OT Eval Moderate Complexity: 1 Mod OT Treatments $Self Care/Home Management : 8-22 mins  Jesse Sans OTR/L Acute Rehabilitation Services Pager: [phone] Office: [place] 01/02/2022, 1:13 PM

## 2022-01-02 NOTE — Progress Notes (Signed)
STROKE TEAM PROGRESS NOTE   SUBJECTIVE (INTERVAL HISTORY) Her daughter is at the bedside. Patient states she feels a lot better than when she came in. She said she got up alone and made it fine to the bedside commode, but PT stated patient was unsteady with gait. She tells NP that her twin sister has Afib. Patient says no one has ever told her she has it.  MRI scan of the brain shows tiny punctate left parietal cortical left medial frontal and right frontal embolic infarcts.  MR angiogram of the brain shows no large vessel stenosis or occlusion.  Urine drug screen is negative.  LDL cholesterol is borderline at 70 mg percent and hemoglobin A1c is 4.9.  OBJECTIVE Vitals:   01/02/22 0930 01/02/22 1000 01/02/22 1030 01/02/22 1141  BP: (!) 148/83 (!) 154/84 (!) 151/86 (!) 164/95  Pulse: 85 84 85 80  Resp: 18 (!) 23 (!) 26 14  Temp:      TempSrc:      SpO2: 99% 97% 98% 99%  Weight:      Height:        CBC:  Recent Labs  Lab 01/01/22 0942  WBC 6.8  NEUTROABS 5.5  HGB 15.9*  HCT 46.9*  MCV 90.4  PLT 654    Basic Metabolic Panel:  Recent Labs  Lab 01/01/22 0942 01/02/22 1016  NA 138 138  K 4.0 3.3*  CL 99 108  CO2 28 24  GLUCOSE 107* 124*  BUN 23 10  CREATININE 0.82 0.68  CALCIUM 10.4* 8.6*    Lipid Panel:  Recent Labs  Lab 01/02/22 0500  CHOL 165  TRIG 90  HDL 76  CHOLHDL 2.2  VLDL 18  LDLCALC 71   HgbA1c:  Lab Results  Component Value Date   HGBA1C 4.9 01/02/2022   Urine Drug Screen:     Component Value Date/Time   LABOPIA NONE DETECTED 01/01/2022 2332   COCAINSCRNUR NONE DETECTED 01/01/2022 2332   LABBENZ NONE DETECTED 01/01/2022 2332   AMPHETMU NONE DETECTED 01/01/2022 2332   THCU NONE DETECTED 01/01/2022 2332   LABBARB NONE DETECTED 01/01/2022 2332     IMAGING  Results for orders placed or performed during the hospital encounter of 01/01/22  MR Brain Wo Contrast (neuro protocol)   Narrative   CLINICAL DATA:  Provided history: Neuro deficit,  acute, stroke suspected; speech change.  EXAM: MRI HEAD WITHOUT CONTRAST  TECHNIQUE: Multiplanar, multiecho pulse sequences of the brain and surrounding structures were obtained without intravenous contrast.  COMPARISON:  Head CT 01/01/2022.  FINDINGS: Brain:  Mild intermittent motion degradation.  Mild generalized cerebral and cerebellar atrophy.  Punctate acute cortical infarct within the left parietal lobe (series 3, image 37).  Two additional punctate foci of diffusion weighted hyperintensity, one within the medial left frontal lobe (series 3, image 39), and the other within the right precentral gyrus (series 3, image 40). These foci are appreciated only on the axial diffusion-weighted sequence, and may reflect additional punctate acute infarcts or image noise artifact.  Background mild-to-moderate multifocal T2 FLAIR hyperintense signal abnormality within the cerebral white matter, nonspecific but compatible with chronic small vessel ischemic disease.  No evidence of an intracranial mass.  No chronic intracranial blood products.  No extra-axial fluid collection.  No midline shift.  Vascular: Maintained flow voids within the proximal large arterial vessels.  Skull and upper cervical spine: No focal suspicious marrow lesion.  Sinuses/Orbits: Visualized orbits show no acute finding. Minimal mucosal thickening within the bilateral ethmoid  and maxillary sinuses.  IMPRESSION: Punctate acute cortical infarct within the left parietal lobe.  Two additional punctate foci of diffusion-weighted hyperintensity, one within the medial left frontal lobe (left ACA vascular territory), and the other within the right precentral gyrus (right MCA vascular territory). These foci are appreciated on the axial diffusion-weighted sequence only, and may reflect additional punctate acute infarcts or image noise artifact.  Mild-to-moderate chronic small vessel ischemic changes  within the cerebral white matter.  Mild generalized parenchymal atrophy.   Electronically Signed   By: Kellie Simmering D.O.   On: 01/01/2022 16:40   MR ANGIO HEAD WO CONTRAST   Narrative   CLINICAL DATA:  76 year old female with neurologic deficit. Several scattered punctate bilateral MCA infarcts on MRI yesterday.  EXAM: MRA HEAD WITHOUT CONTRAST  TECHNIQUE: Angiographic images of the Circle of Willis were acquired using MRA technique without intravenous contrast.  COMPARISON:  Brain MRI 01/01/2022.  FINDINGS: Posterior circulation: Antegrade flow in the distal vertebral arteries, the right appears mildly dominant. Normal PICA origins and vertebrobasilar junction. No distal vertebral or basilar artery stenosis. Tortuous basilar. SCA and PCA origins are patent with right posterior communicating artery and tortuous left PCA P1 segment. Bilateral PCA branches are within normal limits.  Anterior circulation: Antegrade flow in both ICA siphons. No siphon stenosis. Right posterior communicating artery and bilateral ophthalmic artery origins appear normal. Patent carotid termini. Patent MCA and ACA origins. Tortuous right ACA A1 segment with probable fenestration of the anterior communicating artery (normal variant). Visible bilateral ACA branches are within normal limits. MCA M1 segments and MCA bifurcations appear patent without stenosis. Visible bilateral MCA branches are within normal limits.  Anatomic variants: Fenestrated anterior communicating arteries suspected.  Other: No intracranial mass effect or ventriculomegaly.  IMPRESSION: Negative intracranial MRA.   Electronically Signed   By: Genevie Ann M.D.   On: 01/02/2022 05:53   CT Head Wo Contrast   Narrative   CLINICAL DATA:  Mental status change. Increased confusion. Headache.  EXAM: CT HEAD WITHOUT CONTRAST  TECHNIQUE: Contiguous axial images were obtained from the base of the skull through the vertex  without intravenous contrast.  COMPARISON:  None.  FINDINGS: Brain: There is no evidence of an acute infarct, intracranial hemorrhage, mass, midline shift, or extra-axial fluid collection. Mild cerebral atrophy is within normal limits for age. Patchy hypodensities in the cerebral white matter bilaterally are nonspecific but compatible with mild-to-moderate chronic small vessel ischemic disease.  Vascular: Calcified atherosclerosis at the skull base. No hyperdense vessel.  Skull: No acute fracture or suspicious osseous lesion.  Sinuses/Orbits: Paranasal sinuses and mastoid air cells are clear. Unremarkable orbits.  Other: None.  IMPRESSION: 1. No evidence of acute intracranial abnormality. 2. Mild-to-moderate chronic small vessel ischemic disease.   Electronically Signed   By: Logan Bores M.D.   On: 01/01/2022 13:01    PHYSICAL EXAM Gen: Well appearing elderly Caucasian frail female lying comfortably in ED bed.  CV: RRR.  Resp: No increased WOB.  Abd: soft, NT.  Ext: well perfused.  Psych: Affect light.   NEURO:  Mental Status: AA&Ox3  Speech/Language: speech is without dysarthria or aphasia.  Naming, repetition, fluency, and comprehension intact.  No paraphasic errors  Cranial Nerves:  II: PERRL. Visual fields full. III, IV, VI: EOMI. Eyelids elevate symmetrically.  V: Sensation is intact to light touch and symmetrical to face.  VII: Smile is symmetrical. Able to puff cheeks and raise eyebrows.  VIII: hearing intact to voice. IX, X: Palate elevates  symmetrically. Phonation is normal.  KK:[id number] shrug 5/5. XII: tongue is midline without fasciculations. Motor: 5/5 strength to all muscle groups tested.  Tone: is normal and bulk is normal Sensation- Intact to light touch bilaterally. Extinction absent to DSS.Coordination: FTN intact bilaterally, HKS: no ataxia in BLE.No drift.  DTRs: 2+ throughout Gait- deferred  NIHSS:  1a Level of Conscious.: 0 1b LOC  Questions: 0 1c LOC Commands: 0 2 Best Gaze: 0 3 Visual: 0 4 Facial Palsy: 0 5a Motor Arm - left: 0 5b Motor Arm - Right: 0 6a Motor Leg - Left: 0 6b Motor Leg - Right: 0 7 Limb Ataxia: 0 8 Sensory: 0 9 Best Language: 0 10 Dysarthria: 0 11 Extinct and Inattention.: 0 TOTAL: 0  ASSESSMENT/PLAN Felicia Washington is a 76 y.o. female with history of HLD, HTN, hypothyroidism, MHA, and GERD who presented with word finding difficulties, vision difficulties (she could not read the numbers on her coffee pot), and right handed numbness which resolved quickly.   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  Strong suspicion for paroxysmal A. fib CT head without acute findings but + for chronic vessel ischemic disease most likely from HTN hx.  MRI head confirmed strokes as per above. MRA head was negative for LVO, stenosis or thrombus.  Carotid Doppler read is pending.  2D Echo read is pending.  LDL 71 HgbA1c 4.9%.  _____________________for VTE prophylaxis Diet Order             Diet gluten free Room service appropriate? Yes; Fluid consistency: Thin  Diet effective now                  No anticoagulation/antiplatelets prior to admission, now on ASA 46m and Plavix 735mpo qd x 21 days, then ASA alone forever.  Patient counseled to be compliant with her antithrombotic medications Ongoing aggressive stroke risk factor management Therapy recommendations:  PT/OT Disposition: Per PT/OT.   Hypertension Controlled on admission.  Permissive hypertension (OK if < 220/120) but gradually normalize in 5-7 days Long-term BP goal normotensive < 130/90/   HLD controlled, LDL 71, goal < 70.  On Fenofibrate, continue that.  Have PCP recheck prn to make sure LDL stays at goal.   Other Stroke Risk Factors Migraines Suspicion for  Afib as strokes are embolic.    we were going to let patient go, but PT stated she was ataxic, so we'll keep her overnight for therapy and disposition.    Hospital day # 1  KaClance BollMSN, APN-BC Neurology Nurse Practitioner Pager [phone] MD NOTE :  I have personally obtained history,examined this patient, reviewed notes, independently viewed imaging studies, participated in medical decision making and plan of care.ROS completed by me personally and pertinent positives fully documented  I have made any additions or clarifications directly to the above note. Agree with note above.  Patient presented with confusion and speech difficulty secondary to right cerebral embolic infarcts strong suspicion for underlying paroxysmal A. fib.  This has not been proven yet.  Recommend aspirin Plavix for 3 weeks followed by aspirin alone and aggressive risk factor modification.  Check echocardiogram and carotid ultrasound.  She may need loop recorder placement at discharge to look for paroxysmal A. fib.  Long discussion with patient and daughter at the bedside and answered questions.  Discussed with  Dr. Doristine Bosworth. Greater than 50% time during this 50-minute visit was spent in counseling and coordination of care about her cryptogenic strokes about stroke prevention, treatment and answering questions.Antony Contras, MD Medical Director [place] Pager: [phone] 01/02/2022 2:30 PM  To contact Stroke Continuity provider, please refer to [email or web address] After hours, contact General Neurology

## 2022-01-02 NOTE — Progress Notes (Signed)
01/02/22 1542  PT Visit Information  Last PT Received On 01/02/22  Assistance Needed +1  History of Present Illness Felicia Washington is a 76 y.o. female admitted 01/01/22 with episode of word finding difficulties, vision deficits, and with numbness in her right hand. 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 Pt attempting to get off of stretcher and talking about unrelated topics during session  Upper Extremity Assessment  Upper Extremity Assessment Defer to OT evaluation  Lower Extremity Assessment  Lower Extremity Assessment Generalized weakness  Cervical / Trunk Assessment  Cervical / Trunk Assessment Normal  Bed Mobility  Overal bed mobility Modified  Independent  Transfers  Overall transfer level Needs assistance  Equipment used None  Transfers Sit to/from Stand  Sit to Stand Min guard  General transfer comment Min guard for safety.  Ambulation/Gait  Ambulation/Gait assistance Min guard  Gait Distance (Feet) 120 Feet  Assistive device None  Gait Pattern/deviations Step-through pattern;Decreased stride length  General Gait Details Min guard for safety. Mild unsteadiness noted and very short guarded steps. Educated about using RW at home to increase safety.  Gait velocity Decreased  Balance  Overall balance assessment Needs assistance  Sitting-balance support No upper extremity supported;Feet unsupported  Sitting balance-Leahy Scale Good  Standing balance support No upper extremity supported  Standing balance-Leahy Scale Fair  PT - End of Session  Equipment Utilized During Treatment Gait belt  Activity Tolerance Patient tolerated treatment well  Patient left in bed;with call bell/phone within reach (on stretcher in ED)  Nurse Communication Mobility status  PT Assessment  PT Recommendation/Assessment Patient needs continued PT services  PT Visit Diagnosis Unsteadiness on feet (R26.81);Muscle weakness (generalized) (M62.81)  PT Problem List Decreased strength;Decreased balance;Decreased activity tolerance;Decreased mobility;Decreased knowledge of use of DME;Decreased cognition;Decreased safety awareness;Decreased knowledge of precautions  PT Plan  PT Frequency (ACUTE ONLY) Min 3X/week  PT Treatment/Interventions (ACUTE ONLY) DME instruction;Stair training;Gait training;Functional mobility training;Therapeutic activities;Therapeutic exercise;Balance training;Cognitive remediation;Patient/family education  AM-PAC PT "6 Clicks" Mobility Outcome Measure (Version 2)  Help needed turning from your back to your side while in a flat bed without using bedrails? 4  Help needed moving from lying on your back to sitting on the side of a flat  bed without using bedrails? 4  Help needed moving to and from a bed to a chair (including a wheelchair)? 3  Help needed standing up from a chair using your arms (e.g., wheelchair or  bedside chair)? 3  Help needed to walk in hospital room? 3  Help needed climbing 3-5 steps with a railing?  2  6 Click Score 19  Consider Recommendation of Discharge To: Home with [facility]  Progressive Mobility  What is the highest level of mobility based on the progressive mobility assessment? Level 5 (Walks with assist in room/hall) - Balance while stepping forward/back and can walk in room with assist - Complete  Mobility Ambulated with assistance in hallway  PT Recommendation  Follow Up Recommendations Home health PT  Assistance recommended at discharge Frequent or constant Supervision/Assistance  Patient can return home with the following A little help with bathing/dressing/bathroom;Direct supervision/assist for medications management;Direct supervision/assist for financial management;Help with stairs or ramp for entrance;Assist for transportation;Assistance with cooking/housework  Functional Status Assessment Patient has had a recent decline in their functional status and demonstrates the ability to make significant improvements in function in a reasonable and predictable amount of time.  PT equipment Rolling walker (2 wheels)  Individuals Consulted  Consulted and Agree with Results and Recommendations Patient  Acute Rehab PT Goals  Patient Stated Goal to go home  PT Goal Formulation With patient  Time For Goal Achievement 01/16/22  Potential to Achieve Goals Good  PT Time Calculation  PT Start Time (ACUTE ONLY) 1225  PT Stop Time (ACUTE ONLY) 1239  PT Time Calculation (min) (ACUTE ONLY) 14 min  PT General Charges  $$ ACUTE PT VISIT 1 Visit  PT Evaluation  $PT Eval Low Complexity 1 Low  Written Expression  Dominant Hand Right   Pt admitted secondary to problem above with deficits above. Pt with mild  instability, however, improved balance from OT session. Educated about using RW at home. Pt also with increased confusion throughout and decreased safety awareness. Recommending HHPT follow up with assist from family at d/c. Will continue to follow acutely.   Reuel Derby, PT, DPT  Acute Rehabilitation Services  Pager: [phone] Office: [phone]

## 2022-01-02 NOTE — Progress Notes (Signed)
° °  Echocardiogram 2D Echocardiogram has been performed.  Felicia Washington 01/02/2022, 9:54 AM

## 2022-01-02 NOTE — ED Notes (Signed)
Patient transported to MRI 

## 2022-01-03 DIAGNOSIS — G9341 Metabolic encephalopathy: Secondary | ICD-10-CM | POA: Diagnosis not present

## 2022-01-03 DIAGNOSIS — I639 Cerebral infarction, unspecified: Secondary | ICD-10-CM | POA: Diagnosis not present

## 2022-01-03 DIAGNOSIS — I10 Essential (primary) hypertension: Secondary | ICD-10-CM | POA: Diagnosis not present

## 2022-01-03 LAB — BASIC METABOLIC PANEL WITH GFR
Anion gap: 6 (ref 5–15)
BUN: 12 mg/dL (ref 8–23)
CO2: 26 mmol/L (ref 22–32)
Calcium: 9.5 mg/dL (ref 8.9–10.3)
Chloride: 107 mmol/L (ref 98–111)
Creatinine, Ser: 0.75 mg/dL (ref 0.44–1.00)
GFR, Estimated: >60 mL/min (ref >60)
Glucose, Bld: 101 mg/dL — ABNORMAL HIGH (ref 70–99)
Potassium: 3.6 mmol/L (ref 3.5–5.1)
Sodium: 139 mmol/L (ref 135–145)

## 2022-01-03 MED ORDER — HYDRALAZINE HCL 25 MG PO TABS: 25.0000 mg | ORAL_TABLET | Freq: Four times a day (QID) | ORAL | Status: DC | PRN

## 2022-01-03 MED ORDER — ASPIRIN 81 MG PO TBEC: 81.0000 mg | DELAYED_RELEASE_TABLET | Freq: Every day | ORAL | 0 refills | Status: DC

## 2022-01-03 MED ORDER — CLOPIDOGREL BISULFATE 75 MG PO TABS: 75.0000 mg | ORAL_TABLET | Freq: Every day | ORAL | 0 refills | Status: DC

## 2022-01-03 MED ORDER — ATORVASTATIN CALCIUM 40 MG PO TABS
40.0000 mg | ORAL_TABLET | Freq: Every day | ORAL | Status: DC
  Administered 2022-01-03: 40 mg via ORAL
  Filled 2022-01-03: qty 1

## 2022-01-03 MED ORDER — IRBESARTAN 300 MG PO TABS
300.0000 mg | ORAL_TABLET | Freq: Every day | ORAL | Status: DC
  Administered 2022-01-03: 300 mg via ORAL
  Filled 2022-01-03: qty 1

## 2022-01-03 NOTE — Discharge Summary (Signed)
[place] Discharge Summary  DELISIA MCQUISTON ZJI:[id number] DOB: Apr 15, 1946 DOA: 01/01/2022  PCP: Reynold Bowen, MD  Admit date: 01/01/2022 Discharge date: 01/03/2022 30 Day Unplanned Readmission Risk Score    Flowsheet Row ED to Hosp-Admission (Current) from 01/01/2022 in [facility]  30 Day Unplanned Readmission Risk Score (%) 8.55 Filed at 01/03/2022 0800       This score is the patient's risk of an unplanned readmission within 30 days of being discharged (0 -100%). The score is based on dignosis, age, lab data, medications, orders, and past utilization.   Low:  0-14.9   Medium: 15-21.9   High: 22-29.9   Extreme: 30 and above          Admitted From: Home Disposition: Home  Recommendations for Outpatient Follow-up:  Follow up with PCP in 1-2 weeks Please obtain BMP/CBC in one week Please call cardiology's office if you decide to have loop recorder. Follow-up with neurology in 4 weeks. Please follow up with your PCP on the following pending results: Unresulted Labs (From admission, onward)     Start     Ordered   01/01/22 1942  CK  Add-on,   AD       Question:  Release to patient  Answer:  Immediate   01/01/22 1941   01/01/22 1942  Differential  Add-on,   AD       Question:  Release to patient  Answer:  Immediate   01/01/22 1941   01/01/22 1942  Magnesium  Add-on,   AD       Question:  Release to patient  Answer:  Immediate   01/01/22 1941   01/01/22 1942  Phosphorus  Add-on,   AD       Question:  Release to patient  Answer:  Immediate   01/01/22 [place]: Yes Equipment/Devices: None  Discharge Condition: Stable CODE STATUS: Full code Diet recommendation: Cardiac  Subjective: Seen and examined.  Multiple family members at bedside.  Patient has no complaints.  She is ready to go home.  Brief/Interim Summary: KINZI FREDIANI is a 76 y.o. female with PMH significant for hyperlipidemia, hypertension, hypothyroidism,  migraine, GERD who presented with an episode of word finding difficulties with numbness in her right hand that resolved.  She was also recently treated for UTI with amoxicillin.Daughter called EMS and her symptoms had significantly improved by the time EMS got there and she was brought into the ER for further evaluation and work-up.  She had workup with MRI Brain and was diagnosed with acute CVA.  She was otherwise hemodynamically stable upon arrival to ED.  She was seen by neurology.  Started on aspirin and Plavix as well as lipid medications were continued. MRI brain with punctate acute cortical infarct in left parietal lobe along with 2 additional punctate foci, 1 of which is in medial left frontal lobe and a second is in right precentral gyrus, suspected embolic etiology.  MRA head negative for large vessel obstruction.  Doppler carotid was also negative.  Transthoracic echo showed grade 1 diastolic dysfunction but no other abnormality or PFO.  Patient symptoms have totally resolved upon arrival to ED.  Due to suspected embolic source of stroke, neurology recommended loop recorder.  Cardiology consulted, however patient declined loop recorder.  Per family this morning, patient perhaps did not understand what it means and now patient seems to be amenable to that.  Patient  understands that she will need to call cardiology's office to get that implanted if she really wants to do that.  I highly recommended that to her.  Per neurology recommendation, she is going to be discharged on DAPT for 3 weeks and then she will stop Plavix but continue aspirin.  This has been discussed with the patient in front of her family and she verbalized understanding.  She will follow-up with neurology.    Hypokalemia: Resolved.   Acute metabolic encephalopathy: Reportedly she was confused when assessed by admitting hospitalist however on my evaluation, she is fully alert and oriented.  This could be due to her stroke.    Essential hypertension: Now we can tightly controlled her blood pressure, I have resumed her olmesartan which she will continue.   Hypothyroidism: Continue Synthroid.   Mild intermittent asthma: Stable.  Continue Singulair.   GERD: Continue PPI.   Asymptomatic bacteriuria: Patient was diagnosed with UTI by admitting hospitalist however patient denies any urinary symptoms, she is afebrile, no leukocytosis, no nitrites or leukoesterase on the UA, no indication that she has UTI, and states she has asymptomatic bacteriuria, will discontinue Rocephin.  Discharge plan was discussed with patient and/or family member and they verbalized understanding and agreed with it.  Discharge Diagnoses:  Principal Problem:   CVA (cerebral vascular accident) [facility]) Active Problems:   Hypothyroidism   Hypercholesteremia   Essential hypertension   Celiac disease   Asthma   Cardiomegaly   Acute lower UTI   Acute metabolic encephalopathy    Discharge Instructions   Allergies as of 01/03/2022       Reactions   Adhesive [tape] Other (See Comments)   Removes skin   Allopurinol Swelling, Hives, Itching   Codeine Nausea Only   Latex Other (See Comments)   Removes skin        Medication List     TAKE these medications    ampicillin 500 MG capsule Commonly known as: PRINCIPEN Take 500 mg by mouth daily.   aspirin 81 MG EC tablet Take 1 tablet (81 mg total) by mouth daily. Swallow whole.   atorvastatin 40 MG tablet Commonly known as: LIPITOR Take 40 mg by mouth daily.   clopidogrel 75 MG tablet Commonly known as: PLAVIX Take 1 tablet (75 mg total) by mouth daily for 20 days.   Febuxostat 80 MG Tabs Take 80 mg by mouth daily.   fenofibrate micronized 67 MG capsule Commonly known as: LOFIBRA Take 67 mg by mouth daily.   levothyroxine 112 MCG tablet Commonly known as: SYNTHROID Take 224 mcg by mouth daily before breakfast.   montelukast 10 MG tablet Commonly known as:  SINGULAIR Take 10 mg by mouth at bedtime.   olmesartan 40 MG tablet Commonly known as: BENICAR Take 40 mg by mouth daily.   Omeprazole 20 MG Tbdd Take 20 mg by mouth daily.        Follow-up Information     Vickie Epley, MD. Call.   Specialties: Cardiology, Radiology Why: call to set up loop recorder placement if you decide to have one. Contact information: [address] [address] [phone]         Garvin Fila, MD Follow up in 4 week(s).   Specialties: Neurology, Radiology Contact information: [address] [place] [address] [place] Other (See Comments)  Removes skin   Allopurinol Swelling, Hives and Itching   Codeine Nausea Only   Latex Other (See Comments)    Removes skin    Consultations: Neurology and cardiology   Procedures/Studies: CT Head Wo Contrast  Result Date: 01/01/2022 CLINICAL DATA:  Mental status change. Increased confusion. Headache. EXAM: CT HEAD WITHOUT CONTRAST TECHNIQUE: Contiguous axial images were obtained from the base of the skull through the vertex without intravenous contrast. COMPARISON:  None. FINDINGS: Brain: There is no evidence of an acute infarct, intracranial hemorrhage, mass, midline shift, or extra-axial fluid collection. Mild cerebral atrophy is within normal limits for age. Patchy hypodensities in the cerebral white matter bilaterally are nonspecific but compatible with mild-to-moderate chronic small vessel ischemic disease. Vascular: Calcified atherosclerosis at the skull base. No hyperdense vessel. Skull: No acute fracture or suspicious osseous lesion. Sinuses/Orbits: Paranasal sinuses and mastoid air cells are clear. Unremarkable orbits. Other: None. IMPRESSION: 1. No evidence of acute intracranial abnormality. 2. Mild-to-moderate chronic small vessel ischemic disease. Electronically Signed   By: Logan Bores M.D.   On: 01/01/2022 13:01   MR ANGIO HEAD WO CONTRAST  Result Date: 01/02/2022 CLINICAL DATA:  76 year old female with neurologic deficit. Several scattered punctate bilateral MCA infarcts on MRI yesterday. EXAM: MRA HEAD WITHOUT CONTRAST TECHNIQUE: Angiographic images of the Circle of Willis were acquired using MRA technique without intravenous contrast. COMPARISON:  Brain MRI 01/01/2022. FINDINGS: Posterior circulation: Antegrade flow in the distal vertebral arteries, the right appears mildly dominant. Normal PICA origins and vertebrobasilar junction. No distal vertebral or basilar artery stenosis. Tortuous basilar. SCA and PCA origins are patent with right posterior communicating artery and tortuous left PCA P1 segment. Bilateral PCA branches are within normal limits. Anterior circulation: Antegrade flow in both ICA siphons. No siphon stenosis. Right posterior communicating artery and bilateral ophthalmic artery origins appear normal. Patent carotid termini. Patent MCA and ACA origins. Tortuous right ACA A1 segment with probable fenestration of the anterior communicating artery (normal variant). Visible bilateral ACA branches are within normal limits. MCA M1 segments and MCA bifurcations appear patent without stenosis. Visible bilateral MCA branches are within normal limits. Anatomic variants: Fenestrated anterior communicating arteries suspected. Other: No intracranial mass effect or ventriculomegaly. IMPRESSION: Negative intracranial MRA. Electronically Signed   By: Genevie Ann M.D.   On: 01/02/2022 05:53   MR Brain Wo Contrast (neuro protocol)  Result Date: 01/01/2022 CLINICAL DATA:  Provided history: Neuro deficit, acute, stroke suspected; speech change. EXAM: MRI HEAD WITHOUT CONTRAST TECHNIQUE: Multiplanar, multiecho pulse sequences of the brain and surrounding structures were obtained without intravenous contrast. COMPARISON:  Head CT 01/01/2022. FINDINGS: Brain: Mild intermittent motion  degradation. Mild generalized cerebral and cerebellar atrophy. Punctate acute cortical infarct within the left parietal lobe (series 3, image 37). Two additional punctate foci of diffusion weighted hyperintensity, one within the medial left frontal lobe (series 3, image 39), and the other within the right precentral gyrus (series 3, image 40). These foci are appreciated only on the axial diffusion-weighted sequence, and may reflect additional punctate acute infarcts or image noise artifact. Background mild-to-moderate multifocal T2 FLAIR hyperintense signal abnormality within the cerebral white matter, nonspecific but compatible with chronic small vessel ischemic disease. No evidence of an intracranial mass. No chronic intracranial blood products. No extra-axial fluid collection. No midline shift. Vascular: Maintained flow voids within the proximal large arterial vessels. Skull and upper cervical spine: No focal suspicious marrow lesion. Sinuses/Orbits: Visualized orbits show no acute finding. Minimal mucosal thickening within the bilateral ethmoid and  maxillary sinuses. IMPRESSION: Punctate acute cortical infarct within the left parietal lobe. Two additional punctate foci of diffusion-weighted hyperintensity, one within the medial left frontal lobe (left ACA vascular territory), and the other within the right precentral gyrus (right MCA vascular territory). These foci are appreciated on the axial diffusion-weighted sequence only, and may reflect additional punctate acute infarcts or image noise artifact. Mild-to-moderate chronic small vessel ischemic changes within the cerebral white matter. Mild generalized parenchymal atrophy. Electronically Signed   By: Kellie Simmering D.O.   On: 01/01/2022 16:40   US RENAL  Result Date: 12/28/2021 CLINICAL DATA:  Initial evaluation for chronic kidney disease. EXAM: RENAL / URINARY TRACT ULTRASOUND COMPLETE COMPARISON:  Ultrasound from 08/13/2016. FINDINGS: Right Kidney: Renal  measurements: 11.1 x 4.3 x 3.7 cm = volume: 92.3 mL. Renal echogenicity within normal limits. No nephrolithiasis or hydronephrosis. Small extrarenal pelvis noted. No focal renal mass. Left Kidney: Renal measurements: 11.9 x 4.4 x 4.6 cm = volume: 125.2 mL. Renal echogenicity within normal limits. No nephrolithiasis or hydronephrosis. Simple cyst positioned at the upper pole measures up to 4.3 cm in size. The adjacent simple cyst measuring up to 1.9 cm present at the mid-upper pole. 2.2 cm simple cyst present at the lower pole. Bladder: Appears normal for degree of bladder distention. Other: None. IMPRESSION: 1. Renal echogenicity within normal limits. No nephrolithiasis or hydronephrosis. 2. Multiple simple left renal cysts measuring up to 4.3 cm as above. Electronically Signed   By: Jeannine Boga M.D.   On: 12/28/2021 05:32   DG Chest Port 1 View  Result Date: 01/01/2022 CLINICAL DATA:  Questionable sepsis, altered mental status EXAM: PORTABLE CHEST 1 VIEW COMPARISON:  09/27/2019 FINDINGS: Cardiomegaly. Hiatal hernia. Both lungs are clear. Right shoulder reverse total arthroplasty. IMPRESSION: 1. Cardiomegaly without acute abnormality of the lungs in AP portable projection. 2.  Hiatal hernia. Electronically Signed   By: Delanna Ahmadi M.D.   On: 01/01/2022 10:00   ECHOCARDIOGRAM COMPLETE  Result Date: 01/02/2022    ECHOCARDIOGRAM REPORT   Patient Name:   BRANTLEIGH MIFFLIN Date of Exam: 01/02/2022 Medical Rec #:  [id number]       Height:       65.5 in Accession #:    [id number]      Weight:       136.0 lb Date of Birth:  [birth year]       BSA:          1.689 m Patient Age:    66 years        BP:           157/102 mmHg Patient Gender: F               HR:           90 bpm. Exam Location:  Inpatient Procedure: 2D Echo, Cardiac Doppler and Color Doppler Indications:    stroke  History:        Patient has prior history of Echocardiogram examinations, most                 recent 09/08/2019. Risk Factors:Hypertension.   Sonographer:    Beryle Beams Referring Phys: Luther  1. Left ventricular ejection fraction, by estimation, is 60 to 65%. The left ventricle has normal function. The left ventricle has no regional wall motion abnormalities. Left ventricular diastolic parameters are consistent with Grade I diastolic dysfunction (impaired relaxation). Elevated left ventricular end-diastolic pressure.  2. Right ventricular systolic function is normal.  The right ventricular size is normal. There is mildly elevated pulmonary artery systolic pressure.  3. Left atrial size was severely dilated.  4. The mitral valve is normal in structure. Trivial mitral valve regurgitation. No evidence of mitral stenosis.  5. The aortic valve is tricuspid. Aortic valve regurgitation is not visualized. No aortic stenosis is present.  6. The inferior vena cava is normal in size with greater than 50% respiratory variability, suggesting right atrial pressure of 3 mmHg. FINDINGS  Left Ventricle: Left ventricular ejection fraction, by estimation, is 60 to 65%. The left ventricle has normal function. The left ventricle has no regional wall motion abnormalities. The left ventricular internal cavity size was normal in size. There is  no left ventricular hypertrophy. Left ventricular diastolic parameters are consistent with Grade I diastolic dysfunction (impaired relaxation). Elevated left ventricular end-diastolic pressure. Right Ventricle: The right ventricular size is normal. No increase in right ventricular wall thickness. Right ventricular systolic function is normal. There is mildly elevated pulmonary artery systolic pressure. The tricuspid regurgitant velocity is 2.88  m/s, and with an assumed right atrial pressure of 3 mmHg, the estimated right ventricular systolic pressure is 96.2 mmHg. Left Atrium: Left atrial size was severely dilated. Right Atrium: Right atrial size was normal in size. Pericardium: There is no evidence of  pericardial effusion. Mitral Valve: The mitral valve is normal in structure. Trivial mitral valve regurgitation. No evidence of mitral valve stenosis. Tricuspid Valve: The tricuspid valve is normal in structure. Tricuspid valve regurgitation is mild . No evidence of tricuspid stenosis. Aortic Valve: The aortic valve is tricuspid. Aortic valve regurgitation is not visualized. No aortic stenosis is present. Aortic valve mean gradient measures 4.0 mmHg. Aortic valve peak gradient measures 6.2 mmHg. Aortic valve area, by VTI measures 1.96 cm. Pulmonic Valve: The pulmonic valve was normal in structure. Pulmonic valve regurgitation is not visualized. No evidence of pulmonic stenosis. Aorta: The aortic root is normal in size and structure. Venous: The inferior vena cava is normal in size with greater than 50% respiratory variability, suggesting right atrial pressure of 3 mmHg. IAS/Shunts: No atrial level shunt detected by color flow Doppler.  LEFT VENTRICLE PLAX 2D LVIDd:         4.80 cm     Diastology LVIDs:         2.50 cm     LV e' medial:    6.24 cm/s LV PW:         1.10 cm     LV E/e' medial:  14.3 LV IVS:        0.90 cm     LV e' lateral:   4.95 cm/s LVOT diam:     2.10 cm     LV E/e' lateral: 18.0 LV SV:         52 LV SV Index:   31 LVOT Area:     3.46 cm  LV Volumes (MOD) LV vol d, MOD A2C: 80.3 ml LV vol d, MOD A4C: 81.2 ml LV vol s, MOD A2C: 34.3 ml LV vol s, MOD A4C: 37.7 ml LV SV MOD A2C:     46.0 ml LV SV MOD A4C:     81.2 ml LV SV MOD BP:      46.1 ml RIGHT VENTRICLE             IVC RV S prime:     16.30 cm/s  IVC diam: 1.10 cm RVOT diam:      2.90 cm TAPSE (M-mode): 2.3  cm LEFT ATRIUM              Index        RIGHT ATRIUM           Index LA diam:        3.20 cm  1.89 cm/m   RA Area:     15.90 cm LA Vol (A2C):   108.0 ml 63.95 ml/m  RA Volume:   46.40 ml  27.48 ml/m LA Vol (A4C):   44.8 ml  26.53 ml/m LA Biplane Vol: 69.8 ml  41.33 ml/m  AORTIC VALVE                    PULMONIC VALVE AV Area (Vmax):     1.95 cm     PV Vmax:       0.80 m/s AV Area (Vmean):   1.88 cm     PV Vmean:      69.100 cm/s AV Area (VTI):     1.96 cm     PV VTI:        0.206 m AV Vmax:           124.00 cm/s  PV Peak grad:  2.5 mmHg AV Vmean:          92.000 cm/s  PV Mean grad:  2.0 mmHg AV VTI:            0.267 m AV Peak Grad:      6.2 mmHg AV Mean Grad:      4.0 mmHg LVOT Vmax:         69.70 cm/s LVOT Vmean:        50.000 cm/s LVOT VTI:          0.151 m LVOT/AV VTI ratio: 0.57  AORTA Ao Root diam: 2.90 cm Ao Asc diam:  2.90 cm MITRAL VALVE               TRICUSPID VALVE MV Area (PHT): 3.60 cm    TR Peak grad:   33.2 mmHg MV Decel Time: 211 msec    TR Mean grad:   26.0 mmHg MV E velocity: 89.10 cm/s  TR Vmax:        288.00 cm/s MV A velocity: 82.30 cm/s  TR Vmean:       251.0 cm/s MV E/A ratio:  1.08                            SHUNTS                            Systemic VTI:  0.15 m                            Systemic Diam: 2.10 cm                            Pulmonic Diam: 2.90 cm Skeet Latch MD Electronically signed by Skeet Latch MD Signature Date/Time: 01/02/2022/1:54:44 PM    Final    VAS US CAROTID (at [facility] and [facility] only)  Result Date: 01/02/2022 Carotid Arterial Duplex Study Patient Name:  HALIYAH FRYMAN  Date of Exam:   01/02/2022 Medical Rec #: [id number]        Accession #:    [id number] Date of Birth: May 24, 1946  Patient Gender: F Patient Age:   76 years Exam Location:  [facility] Procedure:      VAS US CAROTID Referring Phys: Nyoka Lint DOUTOVA --------------------------------------------------------------------------------  Indications:       CVA and Gait disturbance. Risk Factors:      Hypertension, hyperlipidemia. Comparison Study:  No prior study Performing Technologist: Maudry Mayhew MHA, RDMS, RVT, RDCS  Examination Guidelines: A complete evaluation includes B-mode imaging, spectral Doppler, color Doppler, and power Doppler as needed of all accessible portions of each vessel. Bilateral testing is  considered an integral part of a complete examination. Limited examinations for reoccurring indications may be performed as noted.  Right Carotid Findings: +----------+--------+--------+--------+------------------+------------------+             PSV cm/s EDV cm/s Stenosis Plaque Description Comments            +----------+--------+--------+--------+------------------+------------------+  CCA Prox   73       21                                                       +----------+--------+--------+--------+------------------+------------------+  CCA Distal 67       21                                                       +----------+--------+--------+--------+------------------+------------------+  ICA Prox   36       11                                   intimal thickening  +----------+--------+--------+--------+------------------+------------------+  ICA Distal 62       24                                                       +----------+--------+--------+--------+------------------+------------------+  ECA        55       17                                                       +----------+--------+--------+--------+------------------+------------------+ +----------+--------+-------+----------------+-------------------+             PSV cm/s EDV cms Describe         Arm Pressure (mmHG)  +----------+--------+-------+----------------+-------------------+  Subclavian 107              Multiphasic, WNL                      +----------+--------+-------+----------------+-------------------+ +---------+--------+--+--------+-+---------+  Vertebral PSV cm/s 36 EDV cm/s 9 Antegrade  +---------+--------+--+--------+-+---------+  Left Carotid Findings: +----------+--------+--------+--------+------------------+--------+             PSV cm/s EDV cm/s Stenosis Plaque Description Comments  +----------+--------+--------+--------+------------------+--------+  CCA Prox   71       19                                              +----------+--------+--------+--------+------------------+--------+  CCA Distal 64       19                                             +----------+--------+--------+--------+------------------+--------+  ICA Prox   50       17                                             +----------+--------+--------+--------+------------------+--------+  ICA Distal 118      40                                             +----------+--------+--------+--------+------------------+--------+  ECA        75       15                                             +----------+--------+--------+--------+------------------+--------+ +----------+--------+--------+----------------+-------------------+             PSV cm/s EDV cm/s Describe         Arm Pressure (mmHG)  +----------+--------+--------+----------------+-------------------+  Subclavian 113               Multiphasic, WNL                      +----------+--------+--------+----------------+-------------------+ +---------+--------+--+--------+--+---------+  Vertebral PSV cm/s 46 EDV cm/s 15 Antegrade  +---------+--------+--+--------+--+---------+   Summary: Right Carotid: The extracranial vessels were near-normal with only minimal wall                thickening or plaque. Left Carotid: The extracranial vessels were near-normal with only minimal wall               thickening or plaque. Vertebrals:  Bilateral vertebral arteries demonstrate antegrade flow. Subclavians: Normal flow hemodynamics were seen in bilateral subclavian              arteries. *See table(s) above for measurements and observations.  Electronically signed by Antony Contras MD on 01/02/2022 at 6:01:38 PM.    Final      Discharge Exam: Vitals:   01/03/22 0453 01/03/22 0824  BP: (!) 154/109 (!) 189/106  Pulse:  87  Resp: 17 18  Temp: 98.3 F (36.8 C) 98.6 F (37 C)  SpO2: 98% 99%   Vitals:   01/02/22 2016 01/03/22 0036 01/03/22 0453 01/03/22 0824  BP: (!) 140/99 (!) 165/104 (!) 154/109 (!) 189/106  Pulse: 81    87  Resp: 18 17 17 18   Temp: 98.4 F (36.9 C) 98.4 F (36.9 C) 98.3 F (36.8 C) 98.6 F (37 C)  TempSrc: Oral Oral Oral Oral  SpO2: 100% 98% 98% 99%  Weight:      Height:        General: Pt is alert, awake, not in acute distress Cardiovascular: RRR, S1/S2 +, no rubs, no gallops Respiratory: CTA bilaterally, no wheezing, no rhonchi Abdominal: Soft, NT, ND, bowel sounds + Extremities: no edema, no cyanosis    The results of significant diagnostics from this hospitalization (including imaging,  microbiology, ancillary and laboratory) are listed below for reference.     Microbiology: Recent Results (from the past 240 hour(s))  Resp Panel by RT-PCR (Flu A&B, Covid) Nasopharyngeal Swab     Status: None   Collection Time: 01/01/22  9:42 AM   Specimen: Nasopharyngeal Swab; Nasopharyngeal(NP) swabs in vial transport medium  Result Value Ref Range Status   SARS Coronavirus 2 by RT PCR NEGATIVE NEGATIVE Final    Comment: (NOTE) SARS-CoV-2 target nucleic acids are NOT DETECTED.  The SARS-CoV-2 RNA is generally detectable in upper respiratory specimens during the acute phase of infection. The lowest concentration of SARS-CoV-2 viral copies this assay can detect is 138 copies/mL. A negative result does not preclude SARS-Cov-2 infection and should not be used as the sole basis for treatment or other patient management decisions. A negative result may occur with  improper specimen collection/handling, submission of specimen other than nasopharyngeal swab, presence of viral mutation(s) within the areas targeted by this assay, and inadequate number of viral copies(<138 copies/mL). A negative result must be combined with clinical observations, patient history, and epidemiological information. The expected result is Negative.  Fact Sheet for Patients:  EntrepreneurPulse.com.au  Fact Sheet for Healthcare Providers:  IncredibleEmployment.be  This test  is no t yet approved or cleared by the [place] FDA and  has been authorized for detection and/or diagnosis of SARS-CoV-2 by FDA under an Emergency Use Authorization (EUA). This EUA will remain  in effect (meaning this test can be used) for the duration of the COVID-19 declaration under Section 564(b)(1) of the Act, 21 U.S.C.section 360bbb-3(b)(1), unless the authorization is terminated  or revoked sooner.       Influenza A by PCR NEGATIVE NEGATIVE Final   Influenza B by PCR NEGATIVE NEGATIVE Final    Comment: (NOTE) The Xpert Xpress SARS-CoV-2/FLU/RSV plus assay is intended as an aid in the diagnosis of influenza from Nasopharyngeal swab specimens and should not be used as a sole basis for treatment. Nasal washings and aspirates are unacceptable for Xpert Xpress SARS-CoV-2/FLU/RSV testing.  Fact Sheet for Patients: EntrepreneurPulse.com.au  Fact Sheet for Healthcare Providers: IncredibleEmployment.be  This test is not yet approved or cleared by the [place] FDA and has been authorized for detection and/or diagnosis of SARS-CoV-2 by FDA under an Emergency Use Authorization (EUA). This EUA will remain in effect (meaning this test can be used) for the duration of the COVID-19 declaration under Section 564(b)(1) of the Act, 21 U.S.C. section 360bbb-3(b)(1), unless the authorization is terminated or revoked.  Performed at [facility], [place] [address] [place], [place] [address]   Blood Culture (routine x 2)     Status: None (Preliminary result)   Collection Time: 01/01/22  9:42 AM   Specimen: BLOOD  Result Value Ref Range Status   Specimen Description BLOOD LEFT ANTECUBITAL  Final   Special Requests   Final    BOTTLES DRAWN AEROBIC AND ANAEROBIC Blood Culture results may not be optimal due to an inadequate volume of blood received in culture bottles   Culture   Final    NO GROWTH 2 DAYS Performed at [facility],  [place] [address] [place],  [address]    Report Status PENDING  Incomplete  Blood Culture (routine x 2)     Status: None (Preliminary result)   Collection Time: 01/01/22  9:42 AM   Specimen: BLOOD  Result Value Ref Range Status   Specimen Description BLOOD RIGHT ANTECUBITAL  Final   Special  Requests   Final    BOTTLES DRAWN AEROBIC AND ANAEROBIC Blood Culture results may not be optimal due to an inadequate volume of blood received in culture bottles   Culture   Final    NO GROWTH 2 DAYS Performed at [facility], [place] [address], [place], [place] [address]    Report Status PENDING  Incomplete  Urine Culture     Status: Abnormal   Collection Time: 01/01/22 11:33 AM   Specimen: In/Out Cath Urine  Result Value Ref Range Status   Specimen Description IN/OUT CATH URINE  Final   Special Requests   Final    NONE Performed at [facility], [place] [address] [place], [place] [address]    Culture MULTIPLE SPECIES PRESENT, SUGGEST RECOLLECTION (A)  Final   Report Status 01/02/2022 FINAL  Final     Labs: BNP (last 3 results) No results for input(s): BNP in the last 8760 hours. Basic Metabolic Panel: Recent Labs  Lab 01/01/22 0942 01/02/22 1016 01/03/22 0155  NA 138 138 139  K 4.0 3.3* 3.6  CL 99 108 107  CO2 28 24 26   GLUCOSE 107* 124* 101*  BUN 23 10 12   CREATININE 0.82 0.68 0.75  CALCIUM 10.4* 8.6* 9.5   Liver Function Tests: Recent Labs  Lab 01/01/22 0942 01/02/22 1016  AST 29 22  ALT 25 16  ALKPHOS 61 42  BILITOT 0.9 0.7  PROT 7.2 5.3*  ALBUMIN 4.4 3.1*   No results for input(s): LIPASE, AMYLASE in the last 168 hours. Recent Labs  Lab 01/02/22 0233  AMMONIA 29   CBC: Recent Labs  Lab 01/01/22 0942  WBC 6.8  NEUTROABS 5.5  HGB 15.9*  HCT 46.9*  MCV 90.4  PLT 190   Cardiac Enzymes: No results for input(s): CKTOTAL, CKMB, CKMBINDEX, TROPONINI in the last 168 hours. BNP: Invalid input(s): POCBNP CBG: No results for input(s): GLUCAP in the  last 168 hours. D-Dimer No results for input(s): DDIMER in the last 72 hours. Hgb A1c Recent Labs    01/02/22 0500  HGBA1C 4.9   Lipid Profile Recent Labs    01/02/22 0500  CHOL 165  HDL 76  LDLCALC 71  TRIG 90  CHOLHDL 2.2   Thyroid function studies No results for input(s): TSH, T4TOTAL, T3FREE, THYROIDAB in the last 72 hours.  Invalid input(s): FREET3 Anemia work up No results for input(s): VITAMINB12, FOLATE, FERRITIN, TIBC, IRON, RETICCTPCT in the last 72 hours. Urinalysis    Component Value Date/Time   COLORURINE STRAW (A) 01/01/2022 1133   APPEARANCEUR CLEAR 01/01/2022 1133   LABSPEC 1.008 01/01/2022 1133   PHURINE 8.0 01/01/2022 1133   GLUCOSEU NEGATIVE 01/01/2022 1133   HGBUR SMALL (A) 01/01/2022 1133   BILIRUBINUR NEGATIVE 01/01/2022 1133   KETONESUR NEGATIVE 01/01/2022 1133   PROTEINUR 100 (A) 01/01/2022 1133   NITRITE NEGATIVE 01/01/2022 1133   LEUKOCYTESUR NEGATIVE 01/01/2022 1133   Sepsis Labs Invalid input(s): PROCALCITONIN,  WBC,  LACTICIDVEN Microbiology Recent Results (from the past 240 hour(s))  Resp Panel by RT-PCR (Flu A&B, Covid) Nasopharyngeal Swab     Status: None   Collection Time: 01/01/22  9:42 AM   Specimen: Nasopharyngeal Swab; Nasopharyngeal(NP) swabs in vial transport medium  Result Value Ref Range Status   SARS Coronavirus 2 by RT PCR NEGATIVE NEGATIVE Final    Comment: (NOTE) SARS-CoV-2 target nucleic acids are NOT DETECTED.  The SARS-CoV-2 RNA is generally detectable in upper respiratory specimens during the acute phase of  infection. The lowest concentration of SARS-CoV-2 viral copies this assay can detect is 138 copies/mL. A negative result does not preclude SARS-Cov-2 infection and should not be used as the sole basis for treatment or other patient management decisions. A negative result may occur with  improper specimen collection/handling, submission of specimen other than nasopharyngeal swab, presence of viral  mutation(s) within the areas targeted by this assay, and inadequate number of viral copies(<138 copies/mL). A negative result must be combined with clinical observations, patient history, and epidemiological information. The expected result is Negative.  Fact Sheet for Patients:  EntrepreneurPulse.com.au  Fact Sheet for Healthcare Providers:  IncredibleEmployment.be  This test is no t yet approved or cleared by the [place] FDA and  has been authorized for detection and/or diagnosis of SARS-CoV-2 by FDA under an Emergency Use Authorization (EUA). This EUA will remain  in effect (meaning this test can be used) for the duration of the COVID-19 declaration under Section 564(b)(1) of the Act, 21 U.S.C.section 360bbb-3(b)(1), unless the authorization is terminated  or revoked sooner.       Influenza A by PCR NEGATIVE NEGATIVE Final   Influenza B by PCR NEGATIVE NEGATIVE Final    Comment: (NOTE) The Xpert Xpress SARS-CoV-2/FLU/RSV plus assay is intended as an aid in the diagnosis of influenza from Nasopharyngeal swab specimens and should not be used as a sole basis for treatment. Nasal washings and aspirates are unacceptable for Xpert Xpress SARS-CoV-2/FLU/RSV testing.  Fact Sheet for Patients: EntrepreneurPulse.com.au  Fact Sheet for Healthcare Providers: IncredibleEmployment.be  This test is not yet approved or cleared by the [place] FDA and has been authorized for detection and/or diagnosis of SARS-CoV-2 by FDA under an Emergency Use Authorization (EUA). This EUA will remain in effect (meaning this test can be used) for the duration of the COVID-19 declaration under Section 564(b)(1) of the Act, 21 U.S.C. section 360bbb-3(b)(1), unless the authorization is terminated or revoked.  Performed at [facility], [place] [address], [place], [place] [address]   Blood Culture (routine x 2)      Status: None (Preliminary result)   Collection Time: 01/01/22  9:42 AM   Specimen: BLOOD  Result Value Ref Range Status   Specimen Description BLOOD LEFT ANTECUBITAL  Final   Special Requests   Final    BOTTLES DRAWN AEROBIC AND ANAEROBIC Blood Culture results may not be optimal due to an inadequate volume of blood received in culture bottles   Culture   Final    NO GROWTH 2 DAYS Performed at [facility], [place] [address], [place], [place] [address]    Report Status PENDING  Incomplete  Blood Culture (routine x 2)     Status: None (Preliminary result)   Collection Time: 01/01/22  9:42 AM   Specimen: BLOOD  Result Value Ref Range Status   Specimen Description BLOOD RIGHT ANTECUBITAL  Final   Special Requests   Final    BOTTLES DRAWN AEROBIC AND ANAEROBIC Blood Culture results may not be optimal due to an inadequate volume of blood received in culture bottles   Culture   Final    NO GROWTH 2 DAYS Performed at [facility], [place] [address], [place], [place] [address]    Report Status PENDING  Incomplete  Urine Culture     Status: Abnormal   Collection Time: 01/01/22 11:33 AM   Specimen: In/Out Cath Urine  Result Value Ref Range Status   Specimen Description IN/OUT CATH URINE  Final  Special Requests   Final    NONE Performed at [facility], [place] [address], [place], [place] [address]    Culture MULTIPLE SPECIES PRESENT, SUGGEST RECOLLECTION (A)  Final   Report Status 01/02/2022 FINAL  Final     Time coordinating discharge: Over 30 minutes  SIGNED:   Darliss Cheney, MD  [facility] 01/03/2022, 9:53 AM  If 7PM-7AM, please contact night-coverage [email or web address]

## 2022-01-03 NOTE — Progress Notes (Signed)
Patient BP 189/106, HR 87, MD Pahwani notified by chat. Will continue to monitor.

## 2022-01-03 NOTE — Plan of Care (Signed)

## 2022-01-03 NOTE — Plan of Care (Signed)
°  Problem: Education: Goal: Knowledge of General Education information will improve Description: Including pain rating scale, medication(s)/side effects and non-pharmacologic comfort measures 01/03/2022 1118 by Bess Harvest, RN Outcome: Adequate for Discharge 01/03/2022 0818 by Bess Harvest, RN Outcome: Progressing   Problem: Health Behavior/Discharge Planning: Goal: Ability to manage health-related needs will improve 01/03/2022 1118 by Bess Harvest, RN Outcome: Adequate for Discharge 01/03/2022 0818 by Bess Harvest, RN Outcome: Progressing   Problem: Clinical Measurements: Goal: Ability to maintain clinical measurements within normal limits will improve 01/03/2022 1118 by Bess Harvest, RN Outcome: Adequate for Discharge 01/03/2022 0818 by Bess Harvest, RN Outcome: Progressing Goal: Will remain free from infection 01/03/2022 1118 by Bess Harvest, RN Outcome: Adequate for Discharge 01/03/2022 0818 by Bess Harvest, RN Outcome: Progressing Goal: Diagnostic test results will improve 01/03/2022 1118 by Bess Harvest, RN Outcome: Adequate for Discharge 01/03/2022 0818 by Bess Harvest, RN Outcome: Progressing Goal: Respiratory complications will improve 01/03/2022 1118 by Bess Harvest, RN Outcome: Adequate for Discharge 01/03/2022 0818 by Bess Harvest, RN Outcome: Progressing Goal: Cardiovascular complication will be avoided 01/03/2022 1118 by Bess Harvest, RN Outcome: Adequate for Discharge 01/03/2022 0818 by Bess Harvest, RN Outcome: Progressing   Problem: Activity: Goal: Risk for activity intolerance will decrease 01/03/2022 1118 by Bess Harvest, RN Outcome: Adequate for Discharge 01/03/2022 0818 by Bess Harvest, RN Outcome: Progressing   Problem: Nutrition: Goal: Adequate nutrition will be maintained 01/03/2022 1118 by Bess Harvest, RN Outcome: Adequate for Discharge 01/03/2022 0818 by Bess Harvest, RN Outcome: Progressing    Problem: Coping: Goal: Level of anxiety will decrease 01/03/2022 1118 by Bess Harvest, RN Outcome: Adequate for Discharge 01/03/2022 0818 by Bess Harvest, RN Outcome: Progressing   Problem: Elimination: Goal: Will not experience complications related to bowel motility 01/03/2022 1118 by Bess Harvest, RN Outcome: Adequate for Discharge 01/03/2022 0818 by Bess Harvest, RN Outcome: Progressing Goal: Will not experience complications related to urinary retention 01/03/2022 1118 by Bess Harvest, RN Outcome: Adequate for Discharge 01/03/2022 0818 by Bess Harvest, RN Outcome: Progressing   Problem: Pain Managment: Goal: General experience of comfort will improve 01/03/2022 1118 by Bess Harvest, RN Outcome: Adequate for Discharge 01/03/2022 0818 by Bess Harvest, RN Outcome: Progressing   Problem: Safety: Goal: Ability to remain free from injury will improve 01/03/2022 1118 by Bess Harvest, RN Outcome: Adequate for Discharge 01/03/2022 0818 by Bess Harvest, RN Outcome: Progressing   Problem: Skin Integrity: Goal: Risk for impaired skin integrity will decrease 01/03/2022 1118 by Bess Harvest, RN Outcome: Adequate for Discharge 01/03/2022 0818 by Bess Harvest, RN Outcome: Progressing

## 2022-01-03 NOTE — Progress Notes (Signed)
SLP Cancellation Note  Patient Details Name: Felicia Washington MRN: [id number] DOB: 16-Dec-1946   Cancelled treatment:       Reason Eval/Treat Not Completed: Patient at procedure or test/unavailable; pt d/c prior to SLE completion.     Elvina Sidle, M.S., CCC-SLP 01/03/2022, 3:29 PM

## 2022-01-03 NOTE — TOC Transition Note (Signed)
Transition of Care [facility]) - CM/SW Discharge Note   Patient Details  Name: Felicia Washington MRN: [id number] Date of Birth: 07/04/1946  [facility] CM/SW Contact:  Carles Collet, RN Phone Number: 01/03/2022, 10:33 AM   Clinical Narrative:   spoke w patient sister daughter ay bedside. Patient will stay with her sister at DC. She states that she will do Silver sneakers. Explined [employer] services and she declined. Requested RW from [employer] be delivered to room.  No other TOC needs    Final next level of care: Home/Self Care Barriers to Discharge: No Barriers Identified   Patient Goals and CMS Choice Patient states their goals for this hospitalization and ongoing recovery are:: to go home   Choice offered to / list presented to : NA  Discharge Placement                       Discharge Plan and Services                DME Arranged: Walker rolling DME Agency: [employer] Date DME Agency Contacted: 01/03/22 Time DME Agency Contacted: [id number] Representative spoke with at DME Agency: [place]: [place]          Social Determinants of Health ([place]) Interventions     Readmission Risk Interventions No flowsheet data found.

## 2022-01-03 NOTE — Progress Notes (Signed)
Pt. Was given her ordered dose of 184mcg of synthroid. Pt. Reported that she takes 260mcg of synthroid at home. Pt. Also expressed concern that she is not taking her blood pressure medication. Pt. Was made aware by this RN that her medical team is allowing for permissive hypertension, but patient remains concerned. Dr. Bridgett Larsson paged to notify of patient needing additional synthroid, and to make him aware of patient being concerned about her blood pressure medication.

## 2022-01-05 ENCOUNTER — Telehealth: Payer: Self-pay | Admitting: Cardiology

## 2022-01-05 NOTE — Telephone Encounter (Signed)
Patient states she was seen by Dr. Quentin Ore in the hospital and would like to see about scheduling a loop recorder. She states she would also like know about her insurance coverage on it.

## 2022-01-05 NOTE — Telephone Encounter (Signed)
Sent mychart message

## 2022-01-06 DIAGNOSIS — E890 Postprocedural hypothyroidism: Secondary | ICD-10-CM | POA: Diagnosis not present

## 2022-01-06 LAB — CULTURE, BLOOD (ROUTINE X 2)
Culture: NO GROWTH
Culture: NO GROWTH

## 2022-01-09 ENCOUNTER — Encounter: Payer: Medicare Other | Admitting: Physician Assistant

## 2022-01-09 DIAGNOSIS — I10 Essential (primary) hypertension: Secondary | ICD-10-CM | POA: Diagnosis not present

## 2022-01-09 DIAGNOSIS — E890 Postprocedural hypothyroidism: Secondary | ICD-10-CM | POA: Diagnosis not present

## 2022-01-09 DIAGNOSIS — E785 Hyperlipidemia, unspecified: Secondary | ICD-10-CM | POA: Diagnosis not present

## 2022-01-09 DIAGNOSIS — I499 Cardiac arrhythmia, unspecified: Secondary | ICD-10-CM | POA: Diagnosis not present

## 2022-01-09 DIAGNOSIS — I669 Occlusion and stenosis of unspecified cerebral artery: Secondary | ICD-10-CM | POA: Diagnosis not present

## 2022-01-09 DIAGNOSIS — G319 Degenerative disease of nervous system, unspecified: Secondary | ICD-10-CM | POA: Diagnosis not present

## 2022-01-09 DIAGNOSIS — I4891 Unspecified atrial fibrillation: Secondary | ICD-10-CM | POA: Diagnosis not present

## 2022-01-14 ENCOUNTER — Other Ambulatory Visit: Payer: Self-pay

## 2022-01-14 ENCOUNTER — Encounter (HOSPITAL_COMMUNITY): Payer: Self-pay | Admitting: Physician Assistant

## 2022-01-14 ENCOUNTER — Ambulatory Visit (HOSPITAL_COMMUNITY)
Admission: RE | Admit: 2022-01-14 | Discharge: 2022-01-14 | Disposition: A | Discharge status: Home / Self Care | Payer: Medicare Other | Source: Ambulatory Visit | Attending: Physician Assistant | Admitting: Physician Assistant

## 2022-01-14 VITALS — BP 140/90 | HR 85 | Ht 65.5 in | Wt 140.4 lb

## 2022-01-14 DIAGNOSIS — E785 Hyperlipidemia, unspecified: Secondary | ICD-10-CM | POA: Insufficient documentation

## 2022-01-14 DIAGNOSIS — K219 Gastro-esophageal reflux disease without esophagitis: Secondary | ICD-10-CM | POA: Diagnosis not present

## 2022-01-14 DIAGNOSIS — K900 Celiac disease: Secondary | ICD-10-CM | POA: Diagnosis not present

## 2022-01-14 DIAGNOSIS — E039 Hypothyroidism, unspecified: Secondary | ICD-10-CM | POA: Diagnosis not present

## 2022-01-14 DIAGNOSIS — I10 Essential (primary) hypertension: Secondary | ICD-10-CM | POA: Insufficient documentation

## 2022-01-14 DIAGNOSIS — I639 Cerebral infarction, unspecified: Secondary | ICD-10-CM | POA: Diagnosis not present

## 2022-01-14 MED ORDER — CLOPIDOGREL BISULFATE 75 MG PO TABS: 75.0000 mg | ORAL_TABLET | Freq: Every day | ORAL | 0 refills | Status: DC

## 2022-01-14 MED ORDER — ASPIRIN 81 MG PO TBEC: 81.0000 mg | DELAYED_RELEASE_TABLET | Freq: Every day | ORAL | 0 refills | Status: AC

## 2022-01-14 NOTE — Addendum Note (Signed)
Encounter addended by: Juluis Mire, RN on: 01/14/2022 3:12 PM  Actions taken: Medication long-term status modified, Order list changed

## 2022-01-14 NOTE — Progress Notes (Signed)
Primary Care Physician: Reynold Bowen, MD Primary Cardiologist: none Primary Electrophysiologist: Dr Quentin Ore Referring Physician: Dr Tyson Dense GURNOOR SLOOP is a 76 y.o. female with a history of HLD, HTN, hypothyroidism, GERD, Celiac disease, CVA who presents for follow up in the [facility]. 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 EP was consulted for ILR, patient deferred at that time. She has a CHADS2VASC score of 5. She was seen by her PCP on 01/09/22 and she was noted to have an irregular rhythm. Machine read as atrial fibrillation with controlled rates, appears more consistent with SR with PACs on review today. She is feeling well today with no heart racing or palpitations.   Today, she denies symptoms of palpitations, chest pain, shortness of breath, orthopnea, PND, lower extremity edema, dizziness, presyncope, syncope, snoring, daytime somnolence, bleeding, or neurologic sequela. The patient is tolerating medications without difficulties and is otherwise without complaint today.    Atrial Fibrillation Risk Factors:  she does not have symptoms or diagnosis of sleep apnea. she does not have a history of rheumatic fever.   she has a BMI of Body mass index is 23.01 kg/m.Marland Kitchen Filed Weights   01/14/22 0934  Weight: 63.7 kg    Family History  Problem Relation Age of Onset   Multiple myeloma Father    Bone cancer Father    Celiac disease Sister    Colon polyps Sister    Thyroid disease Mother    Gallstones Mother    Heart attack Maternal Grandfather    Colon polyps Brother    Diabetes Maternal Grandmother      Atrial Fibrillation Management history:  Previous antiarrhythmic drugs: none Previous cardioversions: none Previous ablations: none CHADS2VASC score: 5 Anticoagulation history: Eliquis   Past Medical History:  Diagnosis Date   Anemia    Arthritis    "fingers, shoulders" (09/01/2018)   Asthma    Celiac disease     GERD (gastroesophageal reflux disease)    Graves disease    Heart murmur    History of blood transfusion    "related to c-section"   History of gout    fingers   HLD (hyperlipidemia)    HTN (hypertension)    Hypothyroidism    Migraine    "1 q couple years" (09/01/2018)   Pneumonia    "several times in 1 yr" (09/01/2018)   PONV (postoperative nausea and vomiting)    Seasonal allergies    Past Surgical History:  Procedure Laterality Date   CESAREAN SECTION  1975   COLONOSCOPY     POLYPECTOMY     REVERSE SHOULDER ARTHROPLASTY Right 09/01/2018   REVERSE SHOULDER ARTHROPLASTY Right 09/01/2018   Procedure: RIGHT REVERSE SHOULDER ARTHROPLASTY;  Surgeon: Justice Britain, MD;  Location: [facility];  Service: Orthopedics;  Laterality: Right;   SHOULDER ARTHROSCOPY W/ ROTATOR CUFF REPAIR Bilateral    THYROIDECTOMY     TONSILLECTOMY      Current Outpatient Medications  Medication Sig Dispense Refill   ampicillin (PRINCIPEN) 500 MG capsule Take 500 mg by mouth daily.   5   apixaban (ELIQUIS) 5 MG TABS tablet Take 5 mg by mouth 2 (two) times daily.     atorvastatin (LIPITOR) 40 MG tablet Take 40 mg by mouth daily.     Febuxostat 80 MG TABS Take 80 mg by mouth daily.     fenofibrate micronized (LOFIBRA) 67 MG capsule Take 67 mg by mouth daily.  5   levothyroxine (SYNTHROID,  LEVOTHROID) 112 MCG tablet Take 224 mcg by mouth daily before breakfast.     montelukast (SINGULAIR) 10 MG tablet Take 10 mg by mouth at bedtime.     olmesartan (BENICAR) 40 MG tablet Take 40 mg by mouth daily.     Omeprazole 20 MG TBDD Take 20 mg by mouth daily.     valACYclovir (VALTREX) 500 MG tablet Take 500 mg by mouth daily as needed (Fever Blisters).     No current facility-administered medications for this encounter.    Allergies  Allergen Reactions   Adhesive [Tape] Other (See Comments)    Removes skin   Allopurinol Swelling, Hives and Itching   Codeine Nausea Only   Latex Other (See Comments)    Removes skin     Social History   Socioeconomic History   Marital status: Widowed    Spouse name: Not on file   Number of children: 3   Years of education: Not on file   Highest education level: Not on file  Occupational History   Occupation: sales  Tobacco Use   Smoking status: Never   Smokeless tobacco: Never  Vaping Use   Vaping Use: Never used  Substance and Sexual Activity   Alcohol use: No    Alcohol/week: 0.0 standard drinks   Drug use: No   Sexual activity: Not on file  Other Topics Concern   Not on file  Social History Narrative   Not on file   Social Determinants of Health   Financial Resource Strain: Not on file  Food Insecurity: Not on file  Transportation Needs: Not on file  Physical Activity: Not on file  Stress: Not on file  Social Connections: Not on file  Intimate Partner Violence: Not on file     ROS- All systems are reviewed and negative except as per the HPI above.  Physical Exam: Vitals:   01/14/22 0934  BP: 140/90  Pulse: 85  Weight: 63.7 kg  Height: 5' 5.5" (1.664 m)    GEN- The patient is a well appearing female, alert and oriented x 3 today.   Head- normocephalic, atraumatic Eyes-  Sclera clear, conjunctiva pink Ears- hearing intact Oropharynx- clear Neck- supple  Lungs- Clear to ausculation bilaterally, normal work of breathing Heart- Regular rate and rhythm, no murmurs, rubs or gallops  GI- soft, NT, ND, + BS Extremities- no clubbing, cyanosis, or edema MS- no significant deformity or atrophy Skin- no rash or lesion Psych- euthymic mood, full affect Neuro- strength and sensation are intact  Wt Readings from Last 3 Encounters:  01/14/22 63.7 kg  01/01/22 61.7 kg  02/22/20 61.7 kg    EKG today demonstrates  SR Vent. rate 85 BPM PR interval 160 ms QRS duration 84 ms QT/QTcB 374/445 ms  Echo 01/02/22 demonstrated   1. Left ventricular ejection fraction, by estimation, is 60 to 65%. The  left ventricle has normal function. The  left ventricle has no regional  wall motion abnormalities. Left ventricular diastolic parameters are  consistent with Grade I diastolic dysfunction (impaired relaxation). Elevated left ventricular end-diastolic pressure.   2. Right ventricular systolic function is normal. The right ventricular  size is normal. There is mildly elevated pulmonary artery systolic  pressure.   3. Left atrial size was severely dilated.   4. The mitral valve is normal in structure. Trivial mitral valve  regurgitation. No evidence of mitral stenosis.   5. The aortic valve is tricuspid. Aortic valve regurgitation is not  visualized. No aortic stenosis is  present.   6. The inferior vena cava is normal in size with greater than 50%  respiratory variability, suggesting right atrial pressure of 3 mmHg.   Epic records are reviewed at length today  CHA2DS2-VASc Score = 5  The patient's score is based upon: CHF History: 0 HTN History: 1 Diabetes History: 0 Stroke History: 2 Vascular Disease History: 0 Age Score: 1 Gender Score: 1       ASSESSMENT AND PLAN: 1. Cryptogenic stroke The patient's CHA2DS2-VASc score is 5, indicating a 7.2% annual risk of stroke.   ECG from PCP office reviewed with Dr Quentin Ore, irregular rhythm appears more consistent with SR with PACs. At this point, anticoagulation would not be indicated. Will switch Eliquis back to DAPT until 3 weeks post discharge, then ASA alone per neurology notes.  Would proceed with visit with Dr Quentin Ore for ILR to continue monitoring for afib.  2. HTN Stable, no changes today.    Follow up with Dr Quentin Ore as scheduled.    [facility] [address] [place], [place] [address] [phone] 01/14/2022 10:55 AM

## 2022-01-16 ENCOUNTER — Encounter (HOSPITAL_COMMUNITY): Payer: Self-pay

## 2022-01-20 ENCOUNTER — Other Ambulatory Visit (HOSPITAL_COMMUNITY): Payer: Self-pay | Admitting: *Deleted

## 2022-01-20 ENCOUNTER — Encounter (HOSPITAL_COMMUNITY): Payer: Self-pay

## 2022-01-20 MED ORDER — CLOPIDOGREL BISULFATE 75 MG PO TABS: 75.0000 mg | ORAL_TABLET | Freq: Every day | ORAL | 1 refills | Status: DC

## 2022-01-20 MED ORDER — CLOPIDOGREL BISULFATE 75 MG PO TABS: 75.0000 mg | ORAL_TABLET | Freq: Every day | ORAL | 1 refills | Status: AC

## 2022-01-26 NOTE — Telephone Encounter (Signed)
Spoke with patient via phone. Ricky Fenton PA confirmed sinus tachycardia on kardia strips sent. Pt encouraged to call EP office regarding schedule of linq placement. Pt verbalized agreement.

## 2022-02-02 ENCOUNTER — Telehealth: Payer: Self-pay | Admitting: Cardiology

## 2022-02-02 NOTE — Telephone Encounter (Signed)
Educated about loop recorder and gave billing codes to patient.  Verbalized understanding.

## 2022-02-02 NOTE — Telephone Encounter (Signed)
Patient is requesting a call back to discuss 2/14 appointment with Dr. Quentin Ore. She would like to go over the process of the ILR implant. Please advise.

## 2022-02-03 ENCOUNTER — Telehealth: Payer: Self-pay | Admitting: Cardiology

## 2022-02-03 NOTE — Telephone Encounter (Signed)
Pt reaching out wanting to ask additional questions to the nurse following her last visit.Marland Kitchen please advise

## 2022-02-03 NOTE — Telephone Encounter (Signed)
Answered additional questions about loop implant. Patient verbalized understanding.

## 2022-02-09 NOTE — Progress Notes (Deleted)
Electrophysiology Office Follow up Visit Note:    Date:  02/09/2022   ID:  [name], DOB 08-08-1946, MRN [id number]  PCP:  Felicia Bowen, MD  [facility] Cardiologist:  None  [facility] Electrophysiologist:  Vickie Epley, MD    Interval History:    Felicia Washington is a 76 y.o. 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 Their medical history includes hypertension and PACs.  I met the patient January 02, 2022 when she was hospitalized for cryptogenic stroke.  There was no evidence of atrial fibrillation while hospitalized.  She monitored her heart rhythm after discharge with a cardia device which also showed no evidence of atrial fibrillation.  There was evidence of sinus rhythm with PACs.  She presents today in follow-up and to discuss implantable loop recorder implant.       Past Medical History:  Diagnosis Date   Anemia    Arthritis    "fingers, shoulders" (09/01/2018)   Asthma    Celiac disease    GERD (gastroesophageal reflux disease)    Graves disease    Heart murmur    History of blood transfusion    "related to c-section"   History of gout    fingers   HLD (hyperlipidemia)    HTN (hypertension)    Hypothyroidism    Migraine    "1 q couple years" (09/01/2018)   Pneumonia    "several times in 1 yr" (09/01/2018)   PONV (postoperative nausea and vomiting)    Seasonal allergies     Past Surgical History:  Procedure Laterality Date   CESAREAN SECTION  1975   COLONOSCOPY     POLYPECTOMY     REVERSE SHOULDER ARTHROPLASTY Right 09/01/2018   REVERSE SHOULDER ARTHROPLASTY Right 09/01/2018   Procedure: RIGHT REVERSE SHOULDER ARTHROPLASTY;  Surgeon: Justice Britain, MD;  Location: [place];  Service: Orthopedics;  Laterality: Right;   SHOULDER ARTHROSCOPY W/ ROTATOR CUFF REPAIR Bilateral    THYROIDECTOMY     TONSILLECTOMY      Current Medications: No outpatient medications have been marked as taking for the  02/10/22 encounter (Appointment) with Vickie Epley, MD.     Allergies:   Adhesive [tape], Allopurinol, Codeine, and Latex   Social History   Socioeconomic History   Marital status: Widowed    Spouse name: Not on file   Number of children: 3   Years of education: Not on file   Highest education level: Not on file  Occupational History   Occupation: sales  Tobacco Use   Smoking status: Never   Smokeless tobacco: Never  Vaping Use   Vaping Use: Never used  Substance and Sexual Activity   Alcohol use: No    Alcohol/week: 0.0 standard drinks   Drug use: No   Sexual activity: Not on file  Other Topics Concern   Not on file  Social History Narrative   Not on file   Social Determinants of Health   Financial Resource Strain: Not on file  Food Insecurity: Not on file  Transportation Needs: Not on file  Physical Activity: Not on file  Stress: Not on file  Social Connections: Not on file     Family History: The patient's family history includes Bone cancer in her father; Celiac disease in her sister; Colon polyps in her brother and sister; Diabetes in her maternal grandmother; Gallstones in her mother; Heart attack in her maternal grandfather; Multiple myeloma in her father; Thyroid  disease in her mother.  ROS:   Please see the history of present illness.    All other systems reviewed and are negative.  EKGs/Labs/Other Studies Reviewed:    The following studies were reviewed today:   EKG:  The ekg ordered today demonstrates ***  Recent Labs: 01/01/2022: Hemoglobin 15.9; Platelets 190 01/02/2022: ALT 16 01/03/2022: BUN 12; Creatinine, Ser 0.75; Potassium 3.6; Sodium 139  Recent Lipid Panel    Component Value Date/Time   CHOL 165 01/02/2022 0500   TRIG 90 01/02/2022 0500   HDL 76 01/02/2022 0500   CHOLHDL 2.2 01/02/2022 0500   VLDL 18 01/02/2022 0500   LDLCALC 71 01/02/2022 0500    Physical Exam:    VS:  There were no vitals taken for this visit.    Wt  Readings from Last 3 Encounters:  01/14/22 140 lb 6.4 oz (63.7 kg)  01/01/22 136 lb 0.4 oz (61.7 kg)  02/22/20 136 lb (61.7 kg)     GEN: *** Well nourished, well developed in no acute distress HEENT: Normal NECK: No JVD; No carotid bruits LYMPHATICS: No lymphadenopathy CARDIAC: ***RRR, no murmurs, rubs, gallops RESPIRATORY:  Clear to auscultation without rales, wheezing or rhonchi  ABDOMEN: Soft, non-tender, non-distended MUSCULOSKELETAL:  No edema; No deformity  SKIN: Warm and dry NEUROLOGIC:  Alert and oriented x 3 PSYCHIATRIC:  Normal affect        ASSESSMENT:    No diagnosis found. PLAN:    In order of problems listed above:           Total time spent with patient today *** minutes. This includes reviewing records, evaluating the patient and coordinating care.   Medication Adjustments/Labs and Tests Ordered: Current medicines are reviewed at length with the patient today.  Concerns regarding medicines are outlined above.  No orders of the defined types were placed in this encounter.  No orders of the defined types were placed in this encounter.    Signed, Felicia Mage, MD, [facility], [facility] 02/09/2022 11:54 AM    Electrophysiology [facility]

## 2022-02-10 ENCOUNTER — Ambulatory Visit (INDEPENDENT_AMBULATORY_CARE_PROVIDER_SITE_OTHER): Payer: Medicare Other | Admitting: Cardiology

## 2022-02-10 ENCOUNTER — Encounter: Payer: Self-pay | Admitting: Cardiology

## 2022-02-10 ENCOUNTER — Other Ambulatory Visit: Payer: Self-pay

## 2022-02-10 VITALS — BP 118/70 | HR 89 | Ht 65.5 in | Wt 140.6 lb

## 2022-02-10 DIAGNOSIS — I10 Essential (primary) hypertension: Secondary | ICD-10-CM | POA: Diagnosis not present

## 2022-02-10 DIAGNOSIS — Z95818 Presence of other cardiac implants and grafts: Secondary | ICD-10-CM | POA: Insufficient documentation

## 2022-02-10 DIAGNOSIS — I639 Cerebral infarction, unspecified: Secondary | ICD-10-CM | POA: Diagnosis not present

## 2022-02-10 NOTE — Patient Instructions (Signed)
Medication Instructions:  Your physician recommends that you continue on your current medications as directed. Please refer to the Current Medication list given to you today.  Labwork: None ordered.  Testing/Procedures: None ordered.  Follow-Up:  Your physician wants you to follow-up in: As needed in person with Dr. Quentin Ore. Monitoring remotely.     Implantable Loop Recorder Placement, Care After This sheet gives you information about how to care for yourself after your procedure. Your health care provider may also give you more specific instructions. If you have problems or questions, contact your health care provider. What can I expect after the procedure? After the procedure, it is common to have: Soreness or discomfort near the incision. Some swelling or bruising near the incision.  Follow these instructions at home: Incision care   Leave your outer dressing on for 72 hours.  After 72 hours you can remove your outer dressing and shower. Leave adhesive strips in place. These skin closures may need to stay in place for 1-2 weeks. If adhesive strip edges start to loosen and curl up, you may trim the loose edges.  You may remove the strips if they have not fallen off after 2 weeks. Check your incision area every day for signs of infection. Check for: Redness, swelling, or pain. Fluid or blood. Warmth. Pus or a bad smell. Do not take baths, swim, or use a hot tub until your incision is completely healed. If your wound site starts to bleed apply pressure.      If you have any questions/concerns please call the device clinic at [phone].  Activity  Return to your normal activities.  General instructions Follow instructions from your health care provider about how to manage your implantable loop recorder and transmit the information. Learn how to activate a recording if this is necessary for your type of device. You may go through a metal detection gate, and you may let  someone hold a metal detector over your chest. Show your ID card if needed. Do not have an MRI unless you check with your health care provider first. Take over-the-counter and prescription medicines only as told by your health care provider. Keep all follow-up visits as told by your health care provider. This is important. Contact a health care provider if: You have redness, swelling, or pain around your incision. You have a fever. You have pain that is not relieved by your pain medicine. You have triggered your device because of fainting (syncope) or because of a heartbeat that feels like it is racing, slow, fluttering, or skipping (palpitations). Get help right away if you have: Chest pain. Difficulty breathing. Summary After the procedure, it is common to have soreness or discomfort near the incision. Change your dressing as told by your health care provider. Follow instructions from your health care provider about how to manage your implantable loop recorder and transmit the information. Keep all follow-up visits as told by your health care provider. This is important. This information is not intended to replace advice given to you by your health care provider. Make sure you discuss any questions you have with your health care provider. Document Released: 11/25/2015 Document Revised: 01/29/2018 Document Reviewed: 01/29/2018 Elsevier Patient Education  2020 [employer].

## 2022-02-10 NOTE — Progress Notes (Signed)
Electrophysiology Office Follow up Visit Note:    Date:  02/10/2022   ID:  Felicia Washington, DOB 1946-03-19, MRN [id number]  PCP:  Felicia Bowen, MD  [facility] Cardiologist:  None  [facility] Electrophysiologist:  Felicia Epley, MD    Interval History:    Felicia Washington is a 76 y.o. 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 Their medical history includes hypertension and PACs.  I met the patient January 02, 2022 when she was hospitalized for cryptogenic stroke.  There was no evidence of atrial fibrillation while hospitalized.  She monitored her heart rhythm after discharge with a cardia device which also showed no evidence of atrial fibrillation.  There was evidence of sinus rhythm with PACs.  She presents today in follow-up and to discuss implantable loop recorder implant.  She is accompanied by two family members today. Overall, she is doing good. For a couple days her Felicia Washington mobile detected tachycardia, and she was able to feel her heart racing. At this time her tachycardia is not occurring.  She denies any chest pain, shortness of breath, or peripheral edema. No lightheadedness, headaches, syncope, orthopnea, or PND.       Past Medical History:  Diagnosis Date   Anemia    Arthritis    "fingers, shoulders" (09/01/2018)   Asthma    Celiac disease    GERD (gastroesophageal reflux disease)    Graves disease    Heart murmur    History of blood transfusion    "related to c-section"   History of gout    fingers   HLD (hyperlipidemia)    HTN (hypertension)    Hypothyroidism    Migraine    "1 q couple years" (09/01/2018)   Pneumonia    "several times in 1 yr" (09/01/2018)   PONV (postoperative nausea and vomiting)    Seasonal allergies     Past Surgical History:  Procedure Laterality Date   CESAREAN SECTION  1975   COLONOSCOPY     POLYPECTOMY     REVERSE SHOULDER ARTHROPLASTY Right 09/01/2018   REVERSE SHOULDER  ARTHROPLASTY Right 09/01/2018   Procedure: RIGHT REVERSE SHOULDER ARTHROPLASTY;  Surgeon: Justice Britain, MD;  Location: [place];  Service: Orthopedics;  Laterality: Right;   SHOULDER ARTHROSCOPY W/ ROTATOR CUFF REPAIR Bilateral    THYROIDECTOMY     TONSILLECTOMY      Current Medications: Current Meds  Medication Sig   ampicillin (PRINCIPEN) 500 MG capsule Take 500 mg by mouth daily.    aspirin 81 MG EC tablet Take 1 tablet (81 mg total) by mouth daily. Swallow whole.   atorvastatin (LIPITOR) 40 MG tablet Take 40 mg by mouth daily.   clonazePAM (KLONOPIN) 0.5 MG tablet Take 0.5 mg by mouth daily as needed.   Febuxostat 80 MG TABS Take 80 mg by mouth daily.   fenofibrate micronized (LOFIBRA) 67 MG capsule Take 67 mg by mouth daily.   levothyroxine (SYNTHROID, LEVOTHROID) 112 MCG tablet Take 224 mcg by mouth daily before breakfast.   montelukast (SINGULAIR) 10 MG tablet Take 10 mg by mouth at bedtime.   olmesartan (BENICAR) 40 MG tablet Take 40 mg by mouth daily.   Omeprazole 20 MG TBDD Take 20 mg by mouth daily.   valACYclovir (VALTREX) 500 MG tablet Take 500 mg by mouth daily as needed (Fever Blisters).     Allergies:   Adhesive [tape], Allopurinol, Codeine, and Latex   Social History   Socioeconomic  History   Marital status: Widowed    Spouse name: Not on file   Number of children: 3   Years of education: Not on file   Highest education level: Not on file  Occupational History   Occupation: sales  Tobacco Use   Smoking status: Never   Smokeless tobacco: Never  Vaping Use   Vaping Use: Never used  Substance and Sexual Activity   Alcohol use: No    Alcohol/week: 0.0 standard drinks   Drug use: No   Sexual activity: Not Currently  Other Topics Concern   Not on file  Social History Narrative   Not on file   Social Determinants of Health   Financial Resource Strain: Not on file  Food Insecurity: Not on file  Transportation Needs: Not on file  Physical Activity: Not on  file  Stress: Not on file  Social Connections: Not on file     Family History: The patient's family history includes Bone cancer in her father; Celiac disease in her sister; Colon polyps in her brother and sister; Diabetes in her maternal grandmother; Gallstones in her mother; Heart attack in her maternal grandfather; Multiple myeloma in her father; Thyroid disease in her mother.  ROS:   Please see the history of present illness.    (+) Palpitations. All other systems reviewed and are negative.  EKGs/Labs/Other Studies Reviewed:    The following studies were reviewed today:  Bilateral Carotid Doppler 01/02/2022: Summary:  Right Carotid: The extracranial vessels were near-normal with only minimal wall thickening or plaque.   Left Carotid: The extracranial vessels were near-normal with only minimal  wall thickening or plaque.   Vertebrals:  Bilateral vertebral arteries demonstrate antegrade flow.  Subclavians: Normal flow hemodynamics were seen in bilateral subclavian arteries.   Echo 01/02/2022:  1. Left ventricular ejection fraction, by estimation, is 60 to 65%. The  left ventricle has normal function. The left ventricle has no regional  wall motion abnormalities. Left ventricular diastolic parameters are  consistent with Grade I diastolic  dysfunction (impaired relaxation). Elevated left ventricular end-diastolic  pressure.   2. Right ventricular systolic function is normal. The right ventricular  size is normal. There is mildly elevated pulmonary artery systolic  pressure.   3. Left atrial size was severely dilated.   4. The mitral valve is normal in structure. Trivial mitral valve  regurgitation. No evidence of mitral stenosis.   5. The aortic valve is tricuspid. Aortic valve regurgitation is not  visualized. No aortic stenosis is present.   6. The inferior vena cava is normal in size with greater than 50%  respiratory variability, suggesting right atrial pressure of 3  mmHg.  EKG:  EKG is personally reviewed. 02/10/2022: Sinus rhythm with PACs.  Normal intervals.  Recent Labs: 01/01/2022: Hemoglobin 15.9; Platelets 190 01/02/2022: ALT 16 01/03/2022: BUN 12; Creatinine, Ser 0.75; Potassium 3.6; Sodium 139   Recent Lipid Panel    Component Value Date/Time   CHOL 165 01/02/2022 0500   TRIG 90 01/02/2022 0500   HDL 76 01/02/2022 0500   CHOLHDL 2.2 01/02/2022 0500   VLDL 18 01/02/2022 0500   LDLCALC 71 01/02/2022 0500    Physical Exam:    VS:  BP 118/70    Pulse 89    Ht 5' 5.5" (1.664 m)    Wt 140 lb 9.6 oz (63.8 kg)    SpO2 99%    BMI 23.04 kg/m     Wt Readings from Last 3 Encounters:  02/10/22 140 lb  9.6 oz (63.8 kg)  01/14/22 140 lb 6.4 oz (63.7 kg)  01/01/22 136 lb 0.4 oz (61.7 kg)     GEN: Well nourished, well developed in no acute distress HEENT: Normal NECK: No JVD; No carotid bruits LYMPHATICS: No lymphadenopathy CARDIAC: RRR, no murmurs, rubs, gallops RESPIRATORY:  Clear to auscultation without rales, wheezing or rhonchi  ABDOMEN: Soft, non-tender, non-distended MUSCULOSKELETAL:  No edema; No deformity  SKIN: Warm and dry NEUROLOGIC:  Alert and oriented x 3 PSYCHIATRIC:  Normal affect        ASSESSMENT:    1. Cerebrovascular accident (CVA), unspecified mechanism ([place])   2. Essential hypertension    PLAN:    In order of problems listed above:  #Cryptogenic stroke No evidence of atrial fibrillation thus far although I have a high suspicion given frequent PACs.  I discussed the loop recorder monitoring during today's visit including the risks and she wishes to proceed.  Her family was present for the discussion.  We will plan for implant today.  Total time spent with patient today 35 minutes. This includes reviewing records, evaluating the patient and coordinating care.   Medication Adjustments/Labs and Tests Ordered: Current medicines are reviewed at length with the patient today.  Concerns regarding medicines are outlined  above.   No orders of the defined types were placed in this encounter.  No orders of the defined types were placed in this encounter.  I,Mathew Stumpf,acting as a Education administrator for Felicia Epley, MD.,have documented all relevant documentation on the behalf of Felicia Epley, MD,as directed by  Felicia Epley, MD while in the presence of Felicia Epley, MD.  I, Felicia Epley, MD, have reviewed all documentation for this visit. The documentation on 02/10/22 for the exam, diagnosis, procedures, and orders are all accurate and complete.   Signed, Lars Mage, MD, [facility], [facility] 02/10/2022 8:59 AM    Electrophysiology [facility]   _______________________________  SURGEON:  Lars Mage, MD    PREPROCEDURE DIAGNOSIS:  Cryptogenic stroke    POSTPROCEDURE DIAGNOSIS:  Cryptogenic stroke     PROCEDURES:   1. Implantable loop recorder implantation    INTRODUCTION:  AYLANIE CUBILLOS is a 76 y.o. patient with a history of cryptogenic stroke. Inpatient telemetry has been reviewed and not shown atrial fibrillation. The patient therefore presents today for implantable loop implantation. The costs of loop recorder monitoring have been discussed with the patient.    DESCRIPTION OF PROCEDURE:  Informed written consent was obtained.  The patient required no sedation for the procedure today.  Mapping over the patient's chest was performed to identify the area where electrograms were most prominent for ILR recording.  This area was found to be the left parasternal region over the 4th intercostal space. The patients left chest was therefore prepped and draped in the usual sterile fashion. The skin overlying the left parasternal region was infiltrated with lidocaine for local analgesia.  A 0.5-cm incision was made over the left parasternal region over the 3rd intercostal space.  A subcutaneous ILR pocket was fashioned using a combination of sharp and blunt dissection.  A  [employer] Lux--Dx ILR [id number]) implantable loop recorder was then placed into the pocket  R waves were very prominent and measured >0.83mV.  Steri- Strips and a sterile dressing were then applied.  There were no early apparent complications.     CONCLUSIONS:   1. Successful implantation of a Chemical [employer] Lux-Dx ILR for a history of cryptogenic  stroke  2. No early apparent complications.   Lars Mage, MD 02/24/2021 3:51 PM

## 2022-02-15 ENCOUNTER — Telehealth: Payer: Self-pay | Admitting: Neurology

## 2022-02-15 NOTE — Telephone Encounter (Signed)
Dr. Leonie Man has called out for tomorrow, so I sent the patient a mychart message to call the office tomorrow to get rescheduled.

## 2022-02-16 ENCOUNTER — Inpatient Hospital Stay: Payer: Managed Care, Other (non HMO) | Admitting: Neurology

## 2022-02-17 DIAGNOSIS — G319 Degenerative disease of nervous system, unspecified: Secondary | ICD-10-CM | POA: Diagnosis not present

## 2022-02-17 DIAGNOSIS — I129 Hypertensive chronic kidney disease with stage 1 through stage 4 chronic kidney disease, or unspecified chronic kidney disease: Secondary | ICD-10-CM | POA: Diagnosis not present

## 2022-02-17 DIAGNOSIS — K900 Celiac disease: Secondary | ICD-10-CM | POA: Diagnosis not present

## 2022-02-17 DIAGNOSIS — G43009 Migraine without aura, not intractable, without status migrainosus: Secondary | ICD-10-CM | POA: Diagnosis not present

## 2022-02-17 DIAGNOSIS — I10 Essential (primary) hypertension: Secondary | ICD-10-CM | POA: Diagnosis not present

## 2022-02-17 DIAGNOSIS — E890 Postprocedural hypothyroidism: Secondary | ICD-10-CM | POA: Diagnosis not present

## 2022-02-17 DIAGNOSIS — I669 Occlusion and stenosis of unspecified cerebral artery: Secondary | ICD-10-CM | POA: Diagnosis not present

## 2022-02-17 DIAGNOSIS — I5189 Other ill-defined heart diseases: Secondary | ICD-10-CM | POA: Diagnosis not present

## 2022-02-17 DIAGNOSIS — J45909 Unspecified asthma, uncomplicated: Secondary | ICD-10-CM | POA: Diagnosis not present

## 2022-02-17 DIAGNOSIS — M109 Gout, unspecified: Secondary | ICD-10-CM | POA: Diagnosis not present

## 2022-02-17 DIAGNOSIS — R945 Abnormal results of liver function studies: Secondary | ICD-10-CM | POA: Diagnosis not present

## 2022-02-17 DIAGNOSIS — E785 Hyperlipidemia, unspecified: Secondary | ICD-10-CM | POA: Diagnosis not present

## 2022-02-19 ENCOUNTER — Telehealth: Payer: Self-pay | Admitting: Cardiology

## 2022-02-19 NOTE — Telephone Encounter (Signed)
Patient stated that her I phone watch had picked up episode of atrial fibrillation 45 minutes ago wanted to know if her loop recorder had sent anything informed patient that she would need to send a manual transmission attempted to have patient send a manual transmission transmission unsuccessful settings programmed in [place] would not allow a manual transmission to be sent, this changed informed patient to try and send a manual transmission in the morning and call device clinic regardless if the transmission mentioned if it was successful or not patient voiced understanding

## 2022-02-19 NOTE — Telephone Encounter (Signed)
Patient is calling to talk with Dr. Quentin Ore or nurse in regards to her loop recorder. Please call back

## 2022-02-20 NOTE — Telephone Encounter (Signed)
Patient able to successfully send manual transmission. Presenting rhythm sinus with PACs. No recorded AF episodes. All questions answered. Patient is provided device clinic contact at [phone] for additional questions or concerns that may arise. Will continue to monitor.

## 2022-02-20 NOTE — Telephone Encounter (Signed)
Pt returning nurse call

## 2022-03-03 ENCOUNTER — Ambulatory Visit (INDEPENDENT_AMBULATORY_CARE_PROVIDER_SITE_OTHER): Payer: Medicare Other | Admitting: Neurology

## 2022-03-03 ENCOUNTER — Other Ambulatory Visit: Payer: Self-pay

## 2022-03-03 ENCOUNTER — Telehealth: Payer: Self-pay | Admitting: Cardiology

## 2022-03-03 ENCOUNTER — Encounter: Payer: Self-pay | Admitting: Neurology

## 2022-03-03 VITALS — BP 165/93 | HR 86 | Ht 65.5 in | Wt 137.0 lb

## 2022-03-03 DIAGNOSIS — I639 Cerebral infarction, unspecified: Secondary | ICD-10-CM

## 2022-03-03 DIAGNOSIS — I5189 Other ill-defined heart diseases: Secondary | ICD-10-CM | POA: Insufficient documentation

## 2022-03-03 DIAGNOSIS — R7989 Other specified abnormal findings of blood chemistry: Secondary | ICD-10-CM | POA: Insufficient documentation

## 2022-03-03 DIAGNOSIS — G319 Degenerative disease of nervous system, unspecified: Secondary | ICD-10-CM | POA: Insufficient documentation

## 2022-03-03 NOTE — Telephone Encounter (Signed)
? ?  Pt c/o medication issue: ? ?1. Name of Medication: plavix 75 mg ? ?2. How are you currently taking this medication (dosage and times per day)?  ? ?3. Are you having a reaction (difficulty breathing--STAT)?  ? ?4. What is your medication issue? Pt said, she saw her neurologist and was told to stopped taking her plavix and only take aspirin, she wanted to ask Dr. Quentin Ore if that's ok ?

## 2022-03-03 NOTE — Patient Instructions (Signed)
I had a long d/w patient  and her 2 daughters about her recent cryptogenic strokes, risk for recurrent stroke/TIAs, personally independently reviewed imaging studies and stroke evaluation results and answered questions.Continue aspirin 81 mg daily alone and discontinue Plavix now for secondary stroke prevention and maintain strict control of hypertension with blood pressure goal below 130/90, diabetes with hemoglobin A1c goal below 6.5% and lipids with LDL cholesterol goal below 70 mg/dL. I also advised the patient to eat a healthy diet with plenty of whole grains, cereals, fruits and vegetables, exercise regularly and maintain ideal body weight continue surveillance on loop recorder for paroxysmal A-fib.  Followup in the future with my nurse practitioner Janett Billow in 3 months or call earlier if necessary. Stroke Prevention Some medical conditions and behaviors can lead to a higher chance of having a stroke. You can help prevent a stroke by eating healthy, exercising, not smoking, and managing any medical conditions you have. Stroke is a leading cause of functional impairment. Primary prevention is particularly important because a majority of strokes are first-time events. Stroke changes the lives of not only those who experience a stroke but also their family and other caregivers. How can this condition affect me? A stroke is a medical emergency and should be treated right away. A stroke can lead to brain damage and can sometimes be life-threatening. If a person gets medical treatment right away, there is a better chance of surviving and recovering from a stroke. What can increase my risk? The following medical conditions may increase your risk of a stroke: Cardiovascular disease. High blood pressure (hypertension). Diabetes. High cholesterol. Sickle cell disease. Blood clotting disorders (hypercoagulable state). Obesity. Sleep disorders (obstructive sleep apnea). Other risk factors include: Being  older than age 39. Having a history of blood clots, stroke, or mini-stroke (transient ischemic attack, TIA). Genetic factors, such as race, ethnicity, or a family history of stroke. Smoking cigarettes or using other tobacco products. Taking birth control pills, especially if you also use tobacco. Heavy use of alcohol or drugs, especially cocaine and methamphetamine. Physical inactivity. What actions can I take to prevent this? Manage your health conditions High cholesterol levels. Eating a healthy diet is important for preventing high cholesterol. If cholesterol cannot be managed through diet alone, you may need to take medicines. Take any prescribed medicines to control your cholesterol as told by your health care provider. Hypertension. To reduce your risk of stroke, try to keep your blood pressure below 130/80. Eating a healthy diet and exercising regularly are important for controlling blood pressure. If these steps are not enough to manage your blood pressure, you may need to take medicines. Take any prescribed medicines to control hypertension as told by your health care provider. Ask your health care provider if you should monitor your blood pressure at home. Have your blood pressure checked every year, even if your blood pressure is normal. Blood pressure increases with age and some medical conditions. Diabetes. Eating a healthy diet and exercising regularly are important parts of managing your blood sugar (glucose). If your blood sugar cannot be managed through diet and exercise, you may need to take medicines. Take any prescribed medicines to control your diabetes as told by your health care provider. Get evaluated for obstructive sleep apnea. Talk to your health care provider about getting a sleep evaluation if you snore a lot or have excessive sleepiness. Make sure that any other medical conditions you have, such as atrial fibrillation or atherosclerosis, are  managed. Nutrition Follow instructions  from your health care provider about what to eat or drink to help manage your health condition. These instructions may include: Reducing your daily calorie intake. Limiting how much salt (sodium) you use to 1,500 milligrams (mg) each day. Using only healthy fats for cooking, such as olive oil, canola oil, or sunflower oil. Eating healthy foods. You can do this by: Choosing foods that are high in fiber, such as whole grains, and fresh fruits and vegetables. Eating at least 5 servings of fruits and vegetables a day. Try to fill one-half of your plate with fruits and vegetables at each meal. Choosing lean protein foods, such as lean cuts of meat, poultry without skin, fish, tofu, beans, and nuts. Eating low-fat dairy products. Avoiding foods that are high in sodium. This can help lower blood pressure. Avoiding foods that have saturated fat, trans fat, and cholesterol. This can help prevent high cholesterol. Avoiding processed and prepared foods. Counting your daily carbohydrate intake.  Lifestyle If you drink alcohol: Limit how much you have to: 0-1 drink a day for women who are not pregnant. 0-2 drinks a day for men. Know how much alcohol is in your drink. In the [place] one drink equals one 12 oz bottle of beer (345m), one 5 oz glass of wine (1461m, or one 1 oz glass of hard liquor (4449m Do not use any products that contain nicotine or tobacco. These products include cigarettes, chewing tobacco, and vaping devices, such as e-cigarettes. If you need help quitting, ask your health care provider. Avoid secondhand smoke. Do not use drugs. Activity  Try to stay at a healthy weight. Get at least 30 minutes of exercise on most days, such as: Fast walking. Biking. Swimming. Medicines Take over-the-counter and prescription medicines only as told by your health care provider. Aspirin or blood thinners (antiplatelets or anticoagulants) may be recommended  to reduce your risk of forming blood clots that can lead to stroke. Avoid taking birth control pills. Talk to your health care provider about the risks of taking birth control pills if: You are over 35 30ars old. You smoke. You get very bad headaches. You have had a blood clot. Where to find more information American Stroke Association: www.strokeassociation.org Get help right away if: You or a loved one has any symptoms of a stroke. "BE FAST" is an easy way to remember the main warning signs of a stroke: B - Balance. Signs are dizziness, sudden trouble walking, or loss of balance. E - Eyes. Signs are trouble seeing or a sudden change in vision. F - Face. Signs are sudden weakness or numbness of the face, or the face or eyelid drooping on one side. A - Arms. Signs are weakness or numbness in an arm. This happens suddenly and usually on one side of the body. S - Speech. Signs are sudden trouble speaking, slurred speech, or trouble understanding what people say. T - Time. Time to call emergency services. Write down what time symptoms started. You or a loved one has other signs of a stroke, such as: A sudden, severe headache with no known cause. Nausea or vomiting. Seizure. These symptoms may represent a serious problem that is an emergency. Do not wait to see if the symptoms will go away. Get medical help right away. Call your local emergency services (911 in the [place] Do not drive yourself to the hospital. Summary You can help to prevent a stroke by eating healthy, exercising, not smoking, limiting alcohol intake, and managing any medical conditions you  may have. Do not use any products that contain nicotine or tobacco. These include cigarettes, chewing tobacco, and vaping devices, such as e-cigarettes. If you need help quitting, ask your health care provider. Remember "BE FAST" for warning signs of a stroke. Get help right away if you or a loved one has any of these signs. This information  is not intended to replace advice given to you by your health care provider. Make sure you discuss any questions you have with your health care provider. Document Revised: 07/15/2020 Document Reviewed: 07/15/2020 Elsevier Patient Education  [place].

## 2022-03-03 NOTE — Progress Notes (Signed)
[facility] [address] [place]. [place] [address] [phone]       OFFICE FOLLOW-UP NOTE  [name] Date of Birth:  1946-03-14 Medical Record Number:  [id number]   HPI: Felicia Washington is a 76 year old pleasant Caucasian lady seen today for initial office follow-up visit following hospital admission for stroke in January 2023.  She is accompanied today by 2 of her daughters.  History is obtained from them, review of electronic medical records and opossum reviewed pertinent available imaging films in PACS.  She has past medical history of hypertension, hyperlipidemia, hypothyroidism, migraine, gastroesophageal reflux disease who presented on 01/01/2022 with sudden onset of episode of word finding difficulties and numbness in the right hand which resolved by the time she presented.  She had recent episode of UTI for which she was treated with amoxicillin.  Patient was fine the night before when she went to bed.  When she woke up the next day she had some trouble finding words and trouble with her vision.  She could not see a coffee cup near the coffee maker.  She tried to call her daughter but when she spoke to her on the phone she kept repeating the same phrase multiple times.  At times her speech appeared to be word salad.  Daughter called EMS and symptoms significantly improved by the time EMS got there and brought her to the ER.  NIH stroke scale on admission was 0.  CT of the head on admission was unremarkable but MRI scan showed small acute punctate cortical infarct involving left parietal lobe, left medial frontal lobe and third 1 in the right precentral gyrus.  MR angiogram study of the brain showed no significant large vessel stenosis or occlusion.  Carotid ultrasound showed no significant extracranial stenosis.  2D echo showed normal ejection fraction without cardiac source of embolism.  LDL cholesterol is borderline at 71 mg percent and hemoglobin A1c was 4.9.   Telemetry monitoring was unremarkable and patient subsequently had outpatient loop recorder inserted 2 weeks ago by Dr. Quentin Ore and so far paroxysmal A-fib has not yet been found.  She was discharged on aspirin Plavix for 3 weeks to be followed by aspirin alone but for unclear reasons remains on both medicines at present.  She is complaining of some bruising but no bleeding episodes.  She states her speech difficulties and vision difficulties have completely resolved back to normal.  She has no residual deficits.  Her blood pressure remains elevated at home usually in the 150 systolic range and today it is 163/93 in office.  She plans to discuss this with her primary physician for more aggressive control.  She has no complaints today.  She denies prior history of palpitations, syncopal episodes or cardiac arrhythmias.  ROS:   14 system review of systems is positive for bruising only all other systems negative  PMH:  Past Medical History:  Diagnosis Date   Anemia    Arthritis    "fingers, shoulders" (09/01/2018)   Asthma    Celiac disease    GERD (gastroesophageal reflux disease)    Graves disease    Heart murmur    History of blood transfusion    "related to [name]"   History of gout    fingers   HLD (hyperlipidemia)    HTN (hypertension)    Hypothyroidism    Migraine    "1 q couple years" (09/01/2018)   Pneumonia    "several times in 1 yr" (09/01/2018)  PONV (postoperative nausea and vomiting)    Seasonal allergies     Social History:  Social History   Socioeconomic History   Marital status: Widowed    Spouse name: Not on file   Number of children: 3   Years of education: Not on file   Highest education level: Not on file  Occupational History   Occupation: sales  Tobacco Use   Smoking status: Never   Smokeless tobacco: Never  Vaping Use   Vaping Use: Never used  Substance and Sexual Activity   Alcohol use: No    Alcohol/week: 0.0 standard drinks   Drug use: No    Sexual activity: Not Currently  Other Topics Concern   Not on file  Social History Narrative   Not on file   Social Determinants of Health   Financial Resource Strain: Not on file  Food Insecurity: Not on file  Transportation Needs: Not on file  Physical Activity: Not on file  Stress: Not on file  Social Connections: Not on file  Intimate Partner Violence: Not on file    Medications:   Current Outpatient Medications on File Prior to Visit  Medication Sig Dispense Refill   ampicillin (PRINCIPEN) 500 MG capsule Take 500 mg by mouth daily.   5   aspirin EC 81 MG tablet Take 81 mg by mouth daily. Swallow whole.     atorvastatin (LIPITOR) 40 MG tablet Take 40 mg by mouth daily.     clonazePAM (KLONOPIN) 0.5 MG tablet Take 0.5 mg by mouth daily as needed.     Febuxostat 80 MG TABS Take 80 mg by mouth daily.     fenofibrate micronized (LOFIBRA) 67 MG capsule Take 67 mg by mouth daily.  5   hydrochlorothiazide (HYDRODIURIL) 25 MG tablet Take 25 mg by mouth daily.     levothyroxine (SYNTHROID, LEVOTHROID) 112 MCG tablet Take 224 mcg by mouth daily before breakfast.     montelukast (SINGULAIR) 10 MG tablet Take 10 mg by mouth at bedtime.     olmesartan (BENICAR) 40 MG tablet Take 40 mg by mouth daily.     Omeprazole 20 MG TBDD Take 20 mg by mouth daily.     valACYclovir (VALTREX) 500 MG tablet Take 500 mg by mouth daily as needed (Fever Blisters).     No current facility-administered medications on file prior to visit.    Allergies:   Allergies  Allergen Reactions   Adhesive [Tape] Other (See Comments)    Removes skin   Allopurinol Swelling, Hives and Itching   Codeine Nausea Only   Latex Other (See Comments)    Removes skin    Physical Exam General: well developed, well nourished pleasant elderly Caucasian lady, seated, in no evident distress Head: head normocephalic and atraumatic.  Neck: supple with no carotid or supraclavicular bruits Cardiovascular: regular rate and  rhythm, no murmurs Musculoskeletal: no deformity Skin:  no rash/petichiae Vascular:  Normal pulses all extremities Vitals:   03/03/22 0743  BP: (!) 165/93  Pulse: 86   Neurologic Exam Mental Status: Awake and fully alert. Oriented to place and time. Recent and remote memory intact. Attention span, concentration and fund of knowledge appropriate. Mood and affect appropriate.  Cranial Nerves: Fundoscopic exam reveals sharp disc margins. Pupils equal, briskly reactive to light. Extraocular movements full without nystagmus. Visual fields full to confrontation. Hearing intact. Facial sensation intact. Face, tongue, palate moves normally and symmetrically.  Motor: Normal bulk and tone. Normal strength in all tested extremity muscles. Sensory.:  intact to touch ,pinprick .position and vibratory sensation.  Coordination: Rapid alternating movements normal in all extremities. Finger-to-nose and heel-to-shin performed accurately bilaterally. Gait and Station: Arises from chair without difficulty. Stance is normal. Gait demonstrates normal stride length and balance . Able to heel, toe and tandem walk without difficulty.  Reflexes: 1+ and symmetric. Toes downgoing.   NIHSS  0 Modified [place]  0   ASSESSMENT: 76 year old pleasant Caucasian lady with embolic left parietal, left medial frontal and right precentral gyrus frontal infarcts of cryptogenic etiology in January 2023.  Strong suspicion for paroxysmal A-fib but not yet found so far on loop recorder.  Vascular risk factors of hypertension, hyperlipidemia and age     PLAN: I had a long d/w patient  and her 2 daughters about her recent cryptogenic strokes, risk for recurrent stroke/TIAs, personally independently reviewed imaging studies and stroke evaluation results and answered questions.Continue aspirin 81 mg daily alone and discontinue Plavix now for secondary stroke prevention and maintain strict control of hypertension with blood pressure goal  below 130/90, diabetes with hemoglobin A1c goal below 6.5% and lipids with LDL cholesterol goal below 70 mg/dL. I also advised the patient to eat a healthy diet with plenty of whole grains, cereals, fruits and vegetables, exercise regularly and maintain ideal body weight continue surveillance on loop recorder for paroxysmal A-fib.  Followup in the future with my nurse practitioner Janett Billow in 3 months or call earlier if necessary. Greater than 50% of time during this 35 minute visit was spent on counseling,explanation of diagnosis, planning of further management, discussion with patient and family and coordination of care Antony Contras, MD Note: This document was prepared with digital dictation and possible smart phrase technology. Any transcriptional errors that result from this process are unintentional

## 2022-03-12 ENCOUNTER — Telehealth: Payer: Self-pay

## 2022-03-12 NOTE — Telephone Encounter (Signed)
The patient had questions about traveling and mammograms. The nurse Marliss Czar, rn answered all her questions. ?

## 2022-03-16 ENCOUNTER — Ambulatory Visit (INDEPENDENT_AMBULATORY_CARE_PROVIDER_SITE_OTHER): Payer: Medicare Other

## 2022-03-16 DIAGNOSIS — I639 Cerebral infarction, unspecified: Secondary | ICD-10-CM | POA: Diagnosis not present

## 2022-03-17 DIAGNOSIS — I10 Essential (primary) hypertension: Secondary | ICD-10-CM | POA: Diagnosis not present

## 2022-03-17 DIAGNOSIS — E785 Hyperlipidemia, unspecified: Secondary | ICD-10-CM | POA: Diagnosis not present

## 2022-03-17 DIAGNOSIS — M109 Gout, unspecified: Secondary | ICD-10-CM | POA: Diagnosis not present

## 2022-03-17 DIAGNOSIS — E890 Postprocedural hypothyroidism: Secondary | ICD-10-CM | POA: Diagnosis not present

## 2022-03-18 ENCOUNTER — Telehealth: Payer: Self-pay | Admitting: Cardiology

## 2022-03-18 NOTE — Telephone Encounter (Signed)
Patient is asking that the nurse gives her a call back ?

## 2022-03-19 LAB — CUP PACEART REMOTE DEVICE CHECK
Date Time Interrogation Session: 20230323090735
Implantable Pulse Generator Implant Date: 20230214
Pulse Gen Serial Number: 172346

## 2022-03-20 NOTE — Telephone Encounter (Signed)
The patient has already spoke with the device clinic nurse.  ?

## 2022-03-24 DIAGNOSIS — I669 Occlusion and stenosis of unspecified cerebral artery: Secondary | ICD-10-CM | POA: Diagnosis not present

## 2022-03-24 DIAGNOSIS — E785 Hyperlipidemia, unspecified: Secondary | ICD-10-CM | POA: Diagnosis not present

## 2022-03-24 DIAGNOSIS — I10 Essential (primary) hypertension: Secondary | ICD-10-CM | POA: Diagnosis not present

## 2022-03-24 DIAGNOSIS — J45909 Unspecified asthma, uncomplicated: Secondary | ICD-10-CM | POA: Diagnosis not present

## 2022-03-24 DIAGNOSIS — K219 Gastro-esophageal reflux disease without esophagitis: Secondary | ICD-10-CM | POA: Diagnosis not present

## 2022-03-24 DIAGNOSIS — K900 Celiac disease: Secondary | ICD-10-CM | POA: Diagnosis not present

## 2022-03-24 DIAGNOSIS — M109 Gout, unspecified: Secondary | ICD-10-CM | POA: Diagnosis not present

## 2022-03-24 DIAGNOSIS — R82998 Other abnormal findings in urine: Secondary | ICD-10-CM | POA: Diagnosis not present

## 2022-03-24 DIAGNOSIS — G319 Degenerative disease of nervous system, unspecified: Secondary | ICD-10-CM | POA: Diagnosis not present

## 2022-03-24 DIAGNOSIS — E890 Postprocedural hypothyroidism: Secondary | ICD-10-CM | POA: Diagnosis not present

## 2022-03-24 DIAGNOSIS — I129 Hypertensive chronic kidney disease with stage 1 through stage 4 chronic kidney disease, or unspecified chronic kidney disease: Secondary | ICD-10-CM | POA: Diagnosis not present

## 2022-03-24 DIAGNOSIS — Z0000 Encounter for general adult medical examination without abnormal findings: Secondary | ICD-10-CM | POA: Diagnosis not present

## 2022-03-26 ENCOUNTER — Telehealth: Payer: Self-pay

## 2022-03-26 NOTE — Progress Notes (Signed)
Carelink Summary Report / Loop Recorder 

## 2022-03-26 NOTE — Telephone Encounter (Signed)
Returning patients phone. ? ?Patient advised to send a transmission it works off cell phone towers, not wifi. Appreciative of call.  ?

## 2022-03-26 NOTE — Telephone Encounter (Signed)
Pt left a voicemail stating she has some questions about her loop and traveling. ? ?I called patient back but no answer. LMOVM for patient to return my call. ?

## 2022-04-01 DIAGNOSIS — Z79899 Other long term (current) drug therapy: Secondary | ICD-10-CM | POA: Diagnosis not present

## 2022-04-01 DIAGNOSIS — M1A9XX1 Chronic gout, unspecified, with tophus (tophi): Secondary | ICD-10-CM | POA: Diagnosis not present

## 2022-04-01 DIAGNOSIS — Z6822 Body mass index (BMI) 22.0-22.9, adult: Secondary | ICD-10-CM | POA: Diagnosis not present

## 2022-04-01 DIAGNOSIS — M1991 Primary osteoarthritis, unspecified site: Secondary | ICD-10-CM | POA: Diagnosis not present

## 2022-04-01 DIAGNOSIS — R768 Other specified abnormal immunological findings in serum: Secondary | ICD-10-CM | POA: Diagnosis not present

## 2022-04-14 ENCOUNTER — Telehealth: Payer: Self-pay

## 2022-04-14 NOTE — Telephone Encounter (Signed)
The patient LMOVM stating she has questions about her loop. If she can take aspirin? Her phone number is [phone]. ?

## 2022-04-14 NOTE — Telephone Encounter (Signed)
Returning patients phone call. States someone called back around 3 and answered her question. Appreciative of f/u call. ?

## 2022-04-16 ENCOUNTER — Ambulatory Visit (INDEPENDENT_AMBULATORY_CARE_PROVIDER_SITE_OTHER): Payer: Medicare Other

## 2022-04-16 DIAGNOSIS — I639 Cerebral infarction, unspecified: Secondary | ICD-10-CM | POA: Diagnosis not present

## 2022-04-17 LAB — CUP PACEART REMOTE DEVICE CHECK
Date Time Interrogation Session: 20230421102516
Implantable Pulse Generator Implant Date: 20230214
Pulse Gen Serial Number: 172346

## 2022-04-20 DIAGNOSIS — H31002 Unspecified chorioretinal scars, left eye: Secondary | ICD-10-CM | POA: Diagnosis not present

## 2022-04-27 ENCOUNTER — Inpatient Hospital Stay: Payer: Managed Care, Other (non HMO) | Admitting: Neurology

## 2022-04-27 ENCOUNTER — Telehealth: Payer: Self-pay

## 2022-04-27 NOTE — Telephone Encounter (Signed)
The patient is asking about transmissions and a button she can press on the cell phone for when she is feeling bad. She keeps talking about 16 minutes of AF on the report. I let her know that Dr. Quentin Ore reviewed and determined 1 false AF detection. She would like to know what this was since it was not afib and wants an appointment to talk to Dr. Quentin Ore about it. She wants to know what the plan is to deal with this episode on heart monitor. After education she would still like to make appointment with MD. She is feeling more fatigued since her CVA and feels like it is her heart because she can feel it beating fast.  ?

## 2022-04-27 NOTE — Telephone Encounter (Signed)
Pt is requesting that Dr. Quentin Ore nurse give her a call back.  ?

## 2022-04-27 NOTE — Telephone Encounter (Signed)
Attempted to answer patients questions to the best of my ability in regard to her remote transmission. Advised I will send a message to [facility] who may have answers to her questions about the remote monitoring device.  ?

## 2022-04-27 NOTE — Telephone Encounter (Signed)
I spoke with patient this morning. ?

## 2022-05-04 ENCOUNTER — Telehealth: Payer: Self-pay | Admitting: Cardiology

## 2022-05-04 NOTE — Telephone Encounter (Signed)
Pt called stating that her HR has been in the 120's for about 45 minutes. Pt checked on her [employer] device and verified she is ranging from 117-123bpm. She denies  SOB, CP, dizziness. Condones that she began to have a hoarse voice after lunch today and has had a crick in her neck on the R side for several days. Pt has [place]rence and advised her to send a transmission. Pt has no medication on file such as dilitiazem or an antiarrhythmic drug. ?

## 2022-05-04 NOTE — Telephone Encounter (Signed)
STAT if HR is under 50 or over 120 ?(normal HR is 60-100 beats per minute) ? ?What is your heart rate? BP 123/95 HR 121 ? ?Do you have a log of your heart rate readings (document readings)? 115 earlier ? ?Do you have any other symptoms? Flushed ? ?Patient states her Felicia Washington says she has tachycardia. She says her HR now is 121 and she feels flushed.  ?

## 2022-05-04 NOTE — Telephone Encounter (Signed)
Transmission received  ? ?Presenting rhythm ST at 119bpm  ? ? ?Symptom episode at 17:18 showed  ? ? ? ?Informed patient that ED precautions were still in effect that Judson Roch had reviewed before patient voiced understanding  ?

## 2022-05-04 NOTE — Progress Notes (Signed)
[employer] loop recorder ?

## 2022-05-05 NOTE — Telephone Encounter (Signed)
Sent mychart message requesting Pt send manual transmission for review. ? ?

## 2022-05-07 ENCOUNTER — Telehealth: Payer: Self-pay | Admitting: Cardiology

## 2022-05-07 MED ORDER — METOPROLOL SUCCINATE ER 25 MG PO TB24: 25.0000 mg | ORAL_TABLET | Freq: Every day | ORAL | 3 refills | Status: DC

## 2022-05-07 NOTE — Telephone Encounter (Signed)
See my chart encounter.

## 2022-05-07 NOTE — Telephone Encounter (Signed)
Lets start Toprol 58m PO daily for PACs.  ?Thanks,  ?CL  ?

## 2022-05-07 NOTE — Telephone Encounter (Signed)
Pt would like for nurse to give her a call back. Please advise ?

## 2022-05-18 ENCOUNTER — Ambulatory Visit (INDEPENDENT_AMBULATORY_CARE_PROVIDER_SITE_OTHER): Payer: Medicare Other

## 2022-05-18 DIAGNOSIS — I639 Cerebral infarction, unspecified: Secondary | ICD-10-CM | POA: Diagnosis not present

## 2022-06-03 NOTE — Progress Notes (Signed)
Carelink Summary Report / Loop Recorder 

## 2022-06-11 ENCOUNTER — Telehealth: Payer: Self-pay

## 2022-06-11 ENCOUNTER — Ambulatory Visit: Payer: Medicare Other | Admitting: Adult Health

## 2022-06-11 DIAGNOSIS — K219 Gastro-esophageal reflux disease without esophagitis: Secondary | ICD-10-CM | POA: Diagnosis not present

## 2022-06-11 DIAGNOSIS — I10 Essential (primary) hypertension: Secondary | ICD-10-CM | POA: Diagnosis not present

## 2022-06-11 DIAGNOSIS — E785 Hyperlipidemia, unspecified: Secondary | ICD-10-CM | POA: Diagnosis not present

## 2022-06-11 DIAGNOSIS — M109 Gout, unspecified: Secondary | ICD-10-CM | POA: Diagnosis not present

## 2022-06-11 DIAGNOSIS — J45909 Unspecified asthma, uncomplicated: Secondary | ICD-10-CM | POA: Diagnosis not present

## 2022-06-11 DIAGNOSIS — I669 Occlusion and stenosis of unspecified cerebral artery: Secondary | ICD-10-CM | POA: Diagnosis not present

## 2022-06-11 DIAGNOSIS — K900 Celiac disease: Secondary | ICD-10-CM | POA: Diagnosis not present

## 2022-06-11 DIAGNOSIS — G319 Degenerative disease of nervous system, unspecified: Secondary | ICD-10-CM | POA: Diagnosis not present

## 2022-06-11 DIAGNOSIS — E890 Postprocedural hypothyroidism: Secondary | ICD-10-CM | POA: Diagnosis not present

## 2022-06-11 DIAGNOSIS — I129 Hypertensive chronic kidney disease with stage 1 through stage 4 chronic kidney disease, or unspecified chronic kidney disease: Secondary | ICD-10-CM | POA: Diagnosis not present

## 2022-06-11 NOTE — Telephone Encounter (Signed)
Patient called in stating she had a dizzy spell and she wants to know if anything was seen on her transmission

## 2022-06-11 NOTE — Telephone Encounter (Signed)
Transmission reviewed in Latitude. No episodes noted. Successful telephone call to patient who states she did not have a "dizzy spell" this morning but felt her heart beating irregularly. States she has a "cardia" and it was noted to be irregular on that device as well as her BP cuff. It is noted patient has a history of false AF episodes that have irregular V-V intervals secondary to PACs. Patient is provided reassurance. Noted to have a follow up with Dr. Quentin Ore 06/16/22 for concerns that "something bad will happen to my heart". Will continue to monitor remotes.

## 2022-06-16 ENCOUNTER — Ambulatory Visit (INDEPENDENT_AMBULATORY_CARE_PROVIDER_SITE_OTHER): Payer: Medicare Other | Admitting: Cardiology

## 2022-06-16 ENCOUNTER — Encounter: Payer: Self-pay | Admitting: Cardiology

## 2022-06-16 VITALS — BP 120/70 | HR 70 | Ht 65.5 in | Wt 134.0 lb

## 2022-06-16 DIAGNOSIS — I10 Essential (primary) hypertension: Secondary | ICD-10-CM

## 2022-06-16 DIAGNOSIS — I639 Cerebral infarction, unspecified: Secondary | ICD-10-CM

## 2022-06-16 DIAGNOSIS — I491 Atrial premature depolarization: Secondary | ICD-10-CM

## 2022-06-16 NOTE — Patient Instructions (Addendum)
Medication Instructions:  Your physician recommends that you continue on your current medications as directed. Please refer to the Current Medication list given to you today. *If you need a refill on your cardiac medications before your next appointment, please call your pharmacy*  Lab Work: None. If you have labs (blood work) drawn today and your tests are completely normal, you will receive your results only by: [place] (if you have MyChart) OR A paper copy in the mail If you have any lab test that is abnormal or we need to change your treatment, we will call you to review the results.  Testing/Procedures: None.  Follow-Up: At [facility], you and your health needs are our priority.  As part of our continuing mission to provide you with exceptional heart care, we have created designated Provider Care Teams.  These Care Teams include your primary Cardiologist (physician) and Advanced Practice Providers (APPs -  Physician Assistants and Nurse Practitioners) who all work together to provide you with the care you need, when you need it.  Your physician wants you to follow-up in: 12 months with one of the following Advanced Practice Providers on your designated Care Team:    Tommye Standard, PA-C Legrand Como "Jonni Sanger" [place], [place]   You will receive a reminder letter in the mail two months in advance. If you don't receive a letter, please call our office to schedule the follow-up appointment.  We recommend signing up for the patient portal called "MyChart".  Sign up information is provided on this After Visit Summary.  MyChart is used to connect with patients for Virtual Visits (Telemedicine).  Patients are able to view lab/test results, encounter notes, upcoming appointments, etc.  Non-urgent messages can be sent to your provider as well.   To learn more about what you can do with MyChart, go to [email or web address].    Any Other Special Instructions Will Be Listed Below (If  Applicable).

## 2022-06-16 NOTE — Progress Notes (Signed)
Electrophysiology Office Follow up Visit Note:    Date:  06/16/2022   ID:  Felicia Washington, DOB 17-Apr-1946, MRN [id number]  PCP:  Reynold Bowen, MD  [facility] Cardiologist:  None  [facility] Electrophysiologist:  Vickie Epley, MD    Interval History:    Felicia Washington is a 75 y.o. female who presents for a follow up visit. They were last seen in clinic 02/10/2022, where they underwent successful implantation of a [employer] Lux-Dx ILR for a history of cryptogenic stroke.  Since their last appointment, they called the office 05/04/22 reporting episodes of tachycardia and feeling fatigued.  On 5/11 she was started on Toprol 25 mg daily for PACs.  On 06/11/22 she called following an episode of irregular heart beats. No episodes of arrhythmia were noted on her transmission; patient reassured.  She is accompanied by a family member. Today she is a little hoarse attributable to her asthma.  Since her last visit, a few times she has woken up with rapid palpitations. At home her Felicia Washington device has registered as "irregular."  Prior to her stroke she denies having her current significant shortness of breath and fatigue.  Additionally she complains of chronic constipation issues. Currently treated with Metamucil.  She denies any chest pain, or peripheral edema. No lightheadedness, headaches, syncope, orthopnea, or PND.      Past Medical History:  Diagnosis Date   Anemia    Arthritis    "fingers, shoulders" (09/01/2018)   Asthma    Celiac disease    GERD (gastroesophageal reflux disease)    Graves disease    Heart murmur    History of blood transfusion    "related to c-section"   History of gout    fingers   HLD (hyperlipidemia)    HTN (hypertension)    Hypothyroidism    Migraine    "1 q couple years" (09/01/2018)   Pneumonia    "several times in 1 yr" (09/01/2018)   PONV (postoperative nausea and vomiting)    Seasonal allergies     Past Surgical History:   Procedure Laterality Date   CESAREAN SECTION  1975   COLONOSCOPY     POLYPECTOMY     REVERSE SHOULDER ARTHROPLASTY Right 09/01/2018   REVERSE SHOULDER ARTHROPLASTY Right 09/01/2018   Procedure: RIGHT REVERSE SHOULDER ARTHROPLASTY;  Surgeon: Justice Britain, MD;  Location: [place];  Service: Orthopedics;  Laterality: Right;   SHOULDER ARTHROSCOPY W/ ROTATOR CUFF REPAIR Bilateral    THYROIDECTOMY     TONSILLECTOMY      Current Medications: Current Meds  Medication Sig   ampicillin (PRINCIPEN) 500 MG capsule Take 500 mg by mouth daily.    aspirin EC 81 MG tablet Take 81 mg by mouth daily. Swallow whole.   atorvastatin (LIPITOR) 40 MG tablet Take 40 mg by mouth daily.   clonazePAM (KLONOPIN) 0.5 MG tablet Take 0.5 mg by mouth daily as needed.   Febuxostat 80 MG TABS Take 80 mg by mouth daily.   fenofibrate micronized (LOFIBRA) 67 MG capsule Take 67 mg by mouth daily.   hydrochlorothiazide (HYDRODIURIL) 25 MG tablet Take 25 mg by mouth daily.   levothyroxine (SYNTHROID, LEVOTHROID) 112 MCG tablet Take 224 mcg by mouth daily before breakfast.   metoprolol succinate (TOPROL XL) 25 MG 24 hr tablet Take 1 tablet (25 mg total) by mouth at bedtime.   montelukast (SINGULAIR) 10 MG tablet Take 10 mg by mouth at bedtime.   olmesartan (BENICAR) 40 MG tablet Take 40  mg by mouth 2 (two) times daily.   Omeprazole 20 MG TBDD Take 20 mg by mouth daily.   valACYclovir (VALTREX) 500 MG tablet Take 500 mg by mouth daily as needed (Fever Blisters).     Allergies:   Adhesive [tape], Allopurinol, Codeine, and Latex   Social History   Socioeconomic History   Marital status: Widowed    Spouse name: Not on file   Number of children: 3   Years of education: Not on file   Highest education level: Not on file  Occupational History   Occupation: sales  Tobacco Use   Smoking status: Never   Smokeless tobacco: Never  Vaping Use   Vaping Use: Never used  Substance and Sexual Activity   Alcohol use: No     Alcohol/week: 0.0 standard drinks of alcohol   Drug use: No   Sexual activity: Not Currently  Other Topics Concern   Not on file  Social History Narrative   Not on file   Social Determinants of Health   Financial Resource Strain: Not on file  Food Insecurity: Not on file  Transportation Needs: Not on file  Physical Activity: Not on file  Stress: Not on file  Social Connections: Not on file     Family History: The patient's family history includes Bone cancer in her father; Celiac disease in her sister; Colon polyps in her brother and sister; Diabetes in her maternal grandmother; Gallstones in her mother; Heart attack in her maternal grandfather; Multiple myeloma in her father; Thyroid disease in her mother.  ROS:   Please see the history of present illness.    (+) Rapid palpitations (+) Shortness of breath (+) Fatigue (+) Constipation All other systems reviewed and are negative.  EKGs/Labs/Other Studies Reviewed:    The following studies were reviewed today:  06/16/2022  In clinic device interrogation personally reviewed: Loop recorder shows no true episodes of atrial fibrillation.  There were 3 false atrial fibrillation detections.  Review of these EGM's shows sinus rhythm with PACs.   Bilateral Carotid Doppler 01/02/2022: Summary:  Right Carotid: The extracranial vessels were near-normal with only minimal wall thickening or plaque.   Left Carotid: The extracranial vessels were near-normal with only minimal  wall thickening or plaque.   Vertebrals:  Bilateral vertebral arteries demonstrate antegrade flow.  Subclavians: Normal flow hemodynamics were seen in bilateral subclavian arteries.    Echo 01/02/2022:  1. Left ventricular ejection fraction, by estimation, is 60 to 65%. The  left ventricle has normal function. The left ventricle has no regional  wall motion abnormalities. Left ventricular diastolic parameters are  consistent with Grade I diastolic  dysfunction  (impaired relaxation). Elevated left ventricular end-diastolic  pressure.   2. Right ventricular systolic function is normal. The right ventricular  size is normal. There is mildly elevated pulmonary artery systolic  pressure.   3. Left atrial size was severely dilated.   4. The mitral valve is normal in structure. Trivial mitral valve  regurgitation. No evidence of mitral stenosis.   5. The aortic valve is tricuspid. Aortic valve regurgitation is not  visualized. No aortic stenosis is present.   6. The inferior vena cava is normal in size with greater than 50%  respiratory variability, suggesting right atrial pressure of 3 mmHg.   EKG:  EKG is personally reviewed.  06/16/2022:  EKG was not ordered. 02/10/2022:  Sinus rhythm with PACs.  Normal intervals.   Recent Labs: 01/01/2022: Hemoglobin 15.9; Platelets 190 01/02/2022: ALT 16 01/03/2022: BUN  12; Creatinine, Ser 0.75; Potassium 3.6; Sodium 139   Recent Lipid Panel    Component Value Date/Time   CHOL 165 01/02/2022 0500   TRIG 90 01/02/2022 0500   HDL 76 01/02/2022 0500   CHOLHDL 2.2 01/02/2022 0500   VLDL 18 01/02/2022 0500   LDLCALC 71 01/02/2022 0500    Physical Exam:    VS:  BP 120/70 (BP Location: Left Arm, Patient Position: Sitting, Cuff Size: Normal)   Pulse 70   Ht 5' 5.5" (1.664 m)   Wt 134 lb (60.8 kg)   SpO2 96%   BMI 21.96 kg/m     Wt Readings from Last 3 Encounters:  06/16/22 134 lb (60.8 kg)  03/03/22 137 lb (62.1 kg)  02/10/22 140 lb 9.6 oz (63.8 kg)     GEN: Well nourished, well developed in no acute distress HEENT: Normal NECK: No JVD; No carotid bruits LYMPHATICS: No lymphadenopathy CARDIAC: RRR, no murmurs, rubs, gallops; Device pocket well healed RESPIRATORY:  Clear to auscultation without rales, wheezing or rhonchi  ABDOMEN: Soft, non-tender, non-distended MUSCULOSKELETAL:  No edema; No deformity  SKIN: Warm and dry NEUROLOGIC:  Alert and oriented x 3 PSYCHIATRIC:  Normal affect         ASSESSMENT:    1. Cerebrovascular accident (CVA), unspecified mechanism (Felicia Washington)   2. Essential hypertension   3. Premature atrial contractions    PLAN:    In order of problems listed above:    #Cryptogenic stroke No clear cause at this point.  Continue aspirin and atorvastatin.  #PACs Continue metoprolol at current dose.  No evidence of atrial fibrillation thus far.  #Hypertension Controlled.  Continue current medication regimen.  Recommend checking blood pressures at home 1-2 times per week and recording these values.  These should be brought to your primary care physician for further medication regimen titration.     Follow-up in 1 year.  Medication Adjustments/Labs and Tests Ordered: Current medicines are reviewed at length with the patient today.  Concerns regarding medicines are outlined above.  No orders of the defined types were placed in this encounter.  No orders of the defined types were placed in this encounter.   I,Mathew Stumpf,acting as a Education administrator for Vickie Epley, MD.,have documented all relevant documentation on the behalf of Vickie Epley, MD,as directed by  Vickie Epley, MD while in the presence of Vickie Epley, MD.  I, Vickie Epley, MD, have reviewed all documentation for this visit. The documentation on 06/16/22 for the exam, diagnosis, procedures, and orders are all accurate and complete.   Signed, Lars Mage, MD, [facility], [facility] 06/16/2022 11:17 PM    Electrophysiology [facility]

## 2022-06-18 ENCOUNTER — Ambulatory Visit (INDEPENDENT_AMBULATORY_CARE_PROVIDER_SITE_OTHER): Payer: Medicare Other

## 2022-06-18 DIAGNOSIS — I639 Cerebral infarction, unspecified: Secondary | ICD-10-CM | POA: Diagnosis not present

## 2022-06-22 ENCOUNTER — Telehealth: Payer: Self-pay | Admitting: Cardiology

## 2022-06-22 LAB — CUP PACEART REMOTE DEVICE CHECK
Date Time Interrogation Session: 20230626074533
Implantable Pulse Generator Implant Date: 20230214
Pulse Gen Serial Number: 172346

## 2022-06-26 NOTE — Progress Notes (Signed)
Carelink Summary Report / Loop Recorder 

## 2022-06-29 ENCOUNTER — Ambulatory Visit (INDEPENDENT_AMBULATORY_CARE_PROVIDER_SITE_OTHER): Payer: Medicare Other

## 2022-06-29 DIAGNOSIS — I639 Cerebral infarction, unspecified: Secondary | ICD-10-CM

## 2022-06-29 DIAGNOSIS — Z1231 Encounter for screening mammogram for malignant neoplasm of breast: Secondary | ICD-10-CM | POA: Diagnosis not present

## 2022-06-30 LAB — CUP PACEART REMOTE DEVICE CHECK
Date Time Interrogation Session: 20230703111053
Implantable Pulse Generator Implant Date: 20230214
Pulse Gen Serial Number: 172346

## 2022-07-13 ENCOUNTER — Telehealth: Payer: Self-pay

## 2022-07-13 NOTE — Telephone Encounter (Signed)
Returned call to Pt.  Pt reports a fast heart rate on Saturday that felt "different" and lasted most of the day.  States she felt tired the whole day.  Checked recent BS loop transmission.  No reported events.  Advised no new events had been noted.  Pt asked if there was anything she could take like ibuprofen.  Advised she was safe to take Tylenol.  She indicates understanding.

## 2022-07-13 NOTE — Telephone Encounter (Signed)
Pt left a second message about the nurse checking her transmission for her loops.

## 2022-07-13 NOTE — Telephone Encounter (Signed)
Patient left a voicemail stating that her Heart rate was high on Saturday. She wants to know if her loop picked it up. She would like for the nurse to give her a call back at [phone].

## 2022-07-22 NOTE — Progress Notes (Signed)
Carelink Summary Report / Loop Recorder 

## 2022-07-23 NOTE — Progress Notes (Signed)
[facility] [address] [place]. [place] [address] [phone]       OFFICE FOLLOW-UP NOTE  Felicia Washington Date of Birth:  08-21-1946 Medical Record Number:  [id number]    Primary neurologist: Dr. Leonie Washington Reason for visit: Stroke follow-up   Chief Complaint  Patient presents with   Follow-up    Pt is well. Room 2 with daughter.      HPI:   Update 07/27/2022 JM: Patient returns for stroke follow-up after prior visit approximately 4 months ago accompanied by her daughter.    She has been stable since prior visit without any new or reoccurring stroke/TIA symptoms.  Denies any residual stroke deficits.    Compliant on aspirin and atorvastatin, denies side effects.   Blood pressure today elevated.  Routinely monitors at home, typically 120-130s/70-80s.   Loop recorder has not shown atrial fibrillation thus far.   She is concerned regarding occasional sensation of rapid heart rate and/or irregular heart rate. She has had multiple telephone conversations with cardiology and was started on metoprolol 25 mg daily back in May for PACs.  She was seen by Dr. Quentin Washington 6/20, no further adjustments made at that time. Is concerned regarding continued symptoms, Kardia device and blood pressure monitor at home registers as irregular at times.  At times, has woken up with rapid palpitations. She denies having these symptoms prior to her stroke.  She also mentions occasional daytime fatigue, nocturia, insomnia and awakening feeling unrested.  Denies snoring, witnessed apneas or morning headaches.  She questions underlying sleep apnea.  There is some concern of possible underlying anxiety upon further discussion today but has not previously discussed this with PCP.  She does endorse frustration that cause of her stroke is yet to be found.  She is routinely followed by PCP Dr. Forde Washington and cardiologist Dr. Quentin Washington.  No further concerns at this time      History provided for  reference purposes only Initial visit 03/03/2022 Dr. Leonie Washington: Felicia Washington is a 76 year old pleasant Caucasian lady seen today for initial office follow-up visit following hospital admission for stroke in January 2023.  She is accompanied today by 2 of her daughters.  History is obtained from them, review of electronic medical records and opossum reviewed pertinent available imaging films in PACS.  She has past medical history of hypertension, hyperlipidemia, hypothyroidism, migraine, gastroesophageal reflux disease who presented on 01/01/2022 with sudden onset of episode of word finding difficulties and numbness in the right hand which resolved by the time she presented.  She had recent episode of UTI for which she was treated with amoxicillin.  Patient was fine the night before when she went to bed.  When she woke up the next day she had some trouble finding words and trouble with her vision.  She could not see a coffee cup near the coffee maker.  She tried to call her daughter but when she spoke to her on the phone she kept repeating the same phrase multiple times.  At times her speech appeared to be word salad.  Daughter called EMS and symptoms significantly improved by the time EMS got there and brought her to the ER.  NIH stroke scale on admission was 0.  CT of the head on admission was unremarkable but MRI scan showed small acute punctate cortical infarct involving left parietal lobe, left medial frontal lobe and third 1 in the right precentral gyrus.  MR angiogram study of the brain showed no significant large vessel stenosis  or occlusion.  Carotid ultrasound showed no significant extracranial stenosis.  2D echo showed normal ejection fraction without cardiac source of embolism.  LDL cholesterol is borderline at 71 mg percent and hemoglobin A1c was 4.9.  Telemetry monitoring was unremarkable and patient subsequently had outpatient loop recorder inserted 2 weeks ago by Dr. Quentin Washington and so far paroxysmal A-fib has  not yet been found.  She was discharged on aspirin Plavix for 3 weeks to be followed by aspirin alone but for unclear reasons remains on both medicines at present.  She is complaining of some bruising but no bleeding episodes.  She states her speech difficulties and vision difficulties have completely resolved back to normal.  She has no residual deficits.  Her blood pressure remains elevated at home usually in the 852 systolic range and today it is 163/93 in office.  She plans to discuss this with her primary physician for more aggressive control.  She has no complaints today.  She denies prior history of palpitations, syncopal episodes or cardiac arrhythmias.    ROS:   14 system review of systems is positive for those listed in HPI and all other systems negative  PMH:  Past Medical History:  Diagnosis Date   Anemia    Arthritis    "fingers, shoulders" (09/01/2018)   Asthma    Celiac disease    GERD (gastroesophageal reflux disease)    Graves disease    Heart murmur    History of blood transfusion    "related to [name]"   History of gout    fingers   HLD (hyperlipidemia)    HTN (hypertension)    Hypothyroidism    Migraine    "1 q couple years" (09/01/2018)   Pneumonia    "several times in 1 yr" (09/01/2018)   PONV (postoperative nausea and vomiting)    Seasonal allergies     Social History:  Social History   Socioeconomic History   Marital status: Widowed    Spouse name: Not on file   Number of children: 3   Years of education: Not on file   Highest education level: Not on file  Occupational History   Occupation: sales  Tobacco Use   Smoking status: Never   Smokeless tobacco: Never  Vaping Use   Vaping Use: Never used  Substance and Sexual Activity   Alcohol use: No    Alcohol/week: 0.0 standard drinks of alcohol   Drug use: No   Sexual activity: Not Currently  Other Topics Concern   Not on file  Social History Narrative   Not on file   Social Determinants of  Health   Financial Resource Strain: Not on file  Food Insecurity: Not on file  Transportation Needs: Not on file  Physical Activity: Not on file  Stress: Not on file  Social Connections: Not on file  Intimate Partner Violence: Not on file    Medications:   Current Outpatient Medications on File Prior to Visit  Medication Sig Dispense Refill   ampicillin (PRINCIPEN) 500 MG capsule Take 500 mg by mouth daily.   5   aspirin EC 81 MG tablet Take 81 mg by mouth daily. Swallow whole.     atorvastatin (LIPITOR) 40 MG tablet Take 40 mg by mouth daily.     clonazePAM (KLONOPIN) 0.5 MG tablet Take 0.5 mg by mouth daily as needed.     Febuxostat 80 MG TABS Take 80 mg by mouth daily.     fenofibrate micronized (LOFIBRA) 67 MG capsule  Take 67 mg by mouth daily.  5   hydrochlorothiazide (HYDRODIURIL) 25 MG tablet Take 25 mg by mouth daily.     levothyroxine (SYNTHROID, LEVOTHROID) 112 MCG tablet Take 224 mcg by mouth daily before breakfast.     metoprolol succinate (TOPROL XL) 25 MG 24 hr tablet Take 1 tablet (25 mg total) by mouth at bedtime. 90 tablet 3   montelukast (SINGULAIR) 10 MG tablet Take 10 mg by mouth at bedtime.     olmesartan (BENICAR) 40 MG tablet Take 40 mg by mouth 2 (two) times daily.     Omeprazole 20 MG TBDD Take 20 mg by mouth daily.     valACYclovir (VALTREX) 500 MG tablet Take 500 mg by mouth daily as needed (Fever Blisters).     No current facility-administered medications on file prior to visit.    Allergies:   Allergies  Allergen Reactions   Adhesive [Tape] Other (See Comments)    Removes skin   Allopurinol Swelling, Hives and Itching   Codeine Nausea Only   Latex Other (See Comments)    Removes skin    Physical Exam Today's Vitals   07/27/22 0737  BP: (!) 159/85  Pulse: 64  Weight: 134 lb (60.8 kg)  Height: 5' 5"  (1.651 m)   Body mass index is 22.3 kg/m.  General: well developed, well nourished pleasant elderly Caucasian lady, seated, in no evident  distress Head: head normocephalic and atraumatic.  Neck: supple with no carotid or supraclavicular bruits Cardiovascular: regular rate and rhythm, no murmurs Musculoskeletal: no deformity Skin:  no rash/petichiae Vascular:  Normal pulses all extremities  Neurologic Exam Mental Status: Awake and fully alert.  Fluent speech and language.  Oriented to place and time. Recent and remote memory intact. Attention span, concentration and fund of knowledge appropriate. Mood and affect appropriate.  Cranial Nerves: Pupils equal, briskly reactive to light. Extraocular movements full without nystagmus. Visual fields full to confrontation. Hearing intact. Facial sensation intact. Face, tongue, palate moves normally and symmetrically.  Motor: Normal bulk and tone. Normal strength in all tested extremity muscles. Sensory.: intact to touch ,pinprick .position and vibratory sensation.  Coordination: Rapid alternating movements normal in all extremities. Finger-to-nose and heel-to-shin performed accurately bilaterally. Gait and Station: Arises from chair without difficulty. Stance is normal. Gait demonstrates normal stride length and balance . Able to heel, toe and tandem walk without difficulty.  Reflexes: 1+ and symmetric. Toes downgoing.         ASSESSMENT/PLAN: 76 year old pleasant Caucasian lady with embolic left parietal, left medial frontal and right precentral gyrus frontal infarcts of cryptogenic etiology in January 2023 who recovered well without residual deficits.  Strong suspicion for paroxysmal A-fib but not yet found so far on loop recorder.  Vascular risk factors of hypertension, hyperlipidemia, PACs and age.       1.  Continue aspirin 81 mg daily and atorvastatin 40 mg daily for secondary stroke prevention measures managed by PCP 2.  Continue close PCP follow-up for aggressive stroke risk factor management including BP goal<130/90, and HLD with LDL goal<70  3.  Loop recorder has not shown  atrial fibrillation thus far -routinely monitored by cardiology 4. Long discussion regarding heart palpitations/fluttering sensation. On metoprolol 25 mg daily per cardiology. HR today 64, at home ranges 60-70s. ILR has not shown any recent tachycardia or frequent PACs. Question some of these concerns are in setting of underlying anxiety and highly encouraged further discussion with PCP Dr. Forde Washington. Advised continued f/u with cardiologist Dr. Quentin Washington  as advised 5.  Referral placed to [facility] to rule out possible underlying sleep apnea in setting of cryptogenic stroke and hx of HTN, asthma and hypothyroidism.  Reports daytime fatigue, insomnia, nocturia and nonrestorative sleep as well as awakening with palpitations     Doing well from stroke standpoint without further recommendations and risk factors are managed by PCP/cardiology. She may follow up PRN, as usual for our patients who are strictly being followed for stroke. If any new neurological issues should arise, request PCP place referral for evaluation by one of our neurologists. Thank you.      CC:  Reynold Bowen, MD    I spent 34 minutes of face-to-face and non-face-to-face time with patient and daughter.  This included previsit chart review, lab review, study review, order entry, electronic health record documentation, patient and daughter education and discussion regarding history of prior stroke and possible etiology, secondary stroke prevention measures and aggressive stroke risk factor management, further evaluation to rule out sleep apnea, prolonged discussion regarding heart palpitations/fluttering as noted above and answered all other questions to patient and daughter satisfaction  Frann Rider, AGNP-BC  [facility] [address] [place],  [address]  Phone [phone] Fax [phone] Note: This document was prepared with digital dictation and possible smart phrase technology.  Any transcriptional errors that result from this process are unintentional.

## 2022-07-27 ENCOUNTER — Encounter: Payer: Self-pay | Admitting: Adult Health

## 2022-07-27 ENCOUNTER — Ambulatory Visit (INDEPENDENT_AMBULATORY_CARE_PROVIDER_SITE_OTHER): Payer: Medicare Other | Admitting: Adult Health

## 2022-07-27 VITALS — BP 159/85 | HR 64 | Ht 65.0 in | Wt 134.0 lb

## 2022-07-27 DIAGNOSIS — I639 Cerebral infarction, unspecified: Secondary | ICD-10-CM

## 2022-07-27 DIAGNOSIS — R400 Somnolence: Secondary | ICD-10-CM

## 2022-07-27 DIAGNOSIS — I491 Atrial premature depolarization: Secondary | ICD-10-CM

## 2022-07-27 DIAGNOSIS — G4709 Other insomnia: Secondary | ICD-10-CM | POA: Diagnosis not present

## 2022-07-27 DIAGNOSIS — I10 Essential (primary) hypertension: Secondary | ICD-10-CM

## 2022-07-27 NOTE — Patient Instructions (Addendum)
As you have been doing well from a stroke standpoint and routinely follow with your PCP Dr. Forde Dandy and cardiologist Dr. Quentin Ore, you can follow-up with [place] on an as-needed basis    Ongoing recommendations:  Continue aspirin 81 mg daily  and atorvastatin 40 mg daily -both are recommended lifelong for secondary stroke prevention measures and should be managed by PCP/cardiology  Please follow up with Dr. Forde Dandy regarding likely underlying sleep apnea   You will be called to schedule initial evaluation with our sleep clinic to rule out sleep apnea  Loop recorder will continue to be monitored by cardiology, has not shown atrial fibrillation thus far.   Continue to follow up with PCP regarding blood pressure and cholesterol management  Maintain strict control of hypertension with blood pressure goal below 130/90 and cholesterol with LDL cholesterol (bad cholesterol) goal below 70 mg/dL.   Signs of a Stroke? Follow the BEFAST method:  Balance Watch for a sudden loss of balance, trouble with coordination or vertigo Eyes Is there a sudden loss of vision in one or both eyes? Or double vision?  Face: Ask the person to smile. Does one side of the face droop or is it numb?  Arms: Ask the person to raise both arms. Does one arm drift downward? Is there weakness or numbness of a leg? Speech: Ask the person to repeat a simple phrase. Does the speech sound slurred/strange? Is the person confused ? Time: If you observe any of these signs, call 911.        Thank you for coming to see [place] at [facility]. I hope we have been able to provide you high quality care today.  You may receive a patient satisfaction survey over the next few weeks. We would appreciate your feedback and comments so that we may continue to improve ourselves and the health of our patients.

## 2022-07-28 ENCOUNTER — Ambulatory Visit: Payer: Self-pay

## 2022-07-28 NOTE — Patient Instructions (Signed)
Visit Information  Thank you for taking time to visit with me today. Please don't hesitate to contact me if I can be of assistance to you.   Following are the goals we discussed today:   Goals Addressed               This Visit's Progress     Patient Stated     COMPLETED: Education and Benefit of Advance Care Planning (pt-stated)        Care Coordination Interventions: Determined the patient does have an Advanced Directive  Requested a copy be brought in during a future office visit for patients chart        Please call the care guide team at [phone] if you need to schedule an appointment with me  If you are experiencing a Mental Health or [place] or need someone to talk to, please call [phone] (toll free, 24 hour hotline)  Patient verbalizes understanding of instructions and care plan provided today and agrees to view in [facility]. Active MyChart status and patient understanding of how to access instructions and care plan via MyChart confirmed with patient.     No further follow up required: Please contact me as needed.  Daneen Schick, BSW, CDP Social Worker, Certified Dementia Practitioner Care Coordination [phone]

## 2022-07-28 NOTE — Patient Outreach (Signed)
  Care Coordination   Initial Visit Note   07/28/2022 Name: Felicia Washington MRN: [id number] DOB: 05-12-1946  Felicia Washington is a 76 y.o. year old female who sees Felicia Washington, Felicia Main, MD for primary care. I spoke with  Felicia Washington by phone today  What matters to the patients health and wellness today?  Doing well, no concerns   Goals Addressed               This Visit's Progress     Patient Stated     COMPLETED: Education and Benefit of Advance Care Planning (pt-stated)        Care Coordination Interventions: Determined the patient does have an Advanced Directive  Requested a copy be brought in during a future office visit for patients chart        SDOH assessments and interventions completed:   Yes SDOH Interventions Today    Flowsheet Row Most Recent Value  SDOH Interventions   Food Insecurity Interventions Intervention Not Indicated  Housing Interventions Intervention Not Indicated  Transportation Interventions Intervention Not Indicated       Care Coordination Interventions Activated:  Yes Care Coordination Interventions:  Yes, provided  Follow up plan: No further intervention required.  Encounter Outcome:  Pt. Visit Completed  Felicia Washington, BSW, CDP Social Worker, Certified Dementia Practitioner Care Coordination [phone]

## 2022-07-30 ENCOUNTER — Ambulatory Visit (INDEPENDENT_AMBULATORY_CARE_PROVIDER_SITE_OTHER): Payer: Medicare Other

## 2022-07-30 DIAGNOSIS — I639 Cerebral infarction, unspecified: Secondary | ICD-10-CM

## 2022-08-03 LAB — CUP PACEART REMOTE DEVICE CHECK
Date Time Interrogation Session: 20230807152217
Implantable Pulse Generator Implant Date: 20230214
Pulse Gen Serial Number: 172346

## 2022-08-04 ENCOUNTER — Telehealth: Payer: Self-pay

## 2022-08-04 NOTE — Telephone Encounter (Signed)
Spoke with patient explained to her what the results of the 8/9 transmission meant. Patient voiced understanding, explained to patient she does not need to call every week that if we see something on the loop recorder that needs addressing we will call her.

## 2022-08-04 NOTE — Telephone Encounter (Signed)
The patient called with questions with her my chart. I let her speak with Benjamine Mola, rn.

## 2022-08-10 ENCOUNTER — Telehealth: Payer: Self-pay | Admitting: Cardiology

## 2022-08-10 NOTE — Telephone Encounter (Signed)
Pt requesting a call to go over device check results. Please advise.

## 2022-08-10 NOTE — Telephone Encounter (Signed)
My Chart message sent

## 2022-08-21 ENCOUNTER — Telehealth: Payer: Self-pay

## 2022-08-21 NOTE — Progress Notes (Signed)
Carelink Summary Report / Loop Recorder 

## 2022-08-21 NOTE — Telephone Encounter (Signed)
The patient states she has a few questions for the nurse about the loop recorder. Questions about the sensitivity? I told her the nurse will give her a call back at the [phone] number.

## 2022-08-21 NOTE — Telephone Encounter (Signed)
Pt would like to know if Dr. Quentin Ore can increase the sensitivity on her device so it will catch the heart racing she is having.  Advised would discuss with Dr. Quentin Ore.

## 2022-08-24 NOTE — Telephone Encounter (Signed)
Programming changes made

## 2022-08-24 NOTE — Telephone Encounter (Signed)
The patient is requesting the nurse to give her a call back.

## 2022-08-25 NOTE — Telephone Encounter (Signed)
Returned call to Pt.  Advised tachycardia setting had been reduced to 150 BPM.  Sent device parameters via mychart per request.

## 2022-09-02 ENCOUNTER — Telehealth: Payer: Self-pay

## 2022-09-02 NOTE — Telephone Encounter (Signed)
Patient called in wanting to know what was seen on her transmission yesterday 09/01/2022. I have let patient know a nurse will call her back

## 2022-09-02 NOTE — Telephone Encounter (Addendum)
I had discussed false AF events with patient. She has further questions about her BP. Advised to contact PCP about BP.

## 2022-09-02 NOTE — Telephone Encounter (Signed)
Patient was calling back with questions. Please advise

## 2022-09-02 NOTE — Telephone Encounter (Addendum)
Returned patients phone call. Advised patient her device showed 4 minutes of AF but were not true AF, appears SR. Patient was appreciative of call.

## 2022-09-02 NOTE — Telephone Encounter (Signed)
Patient called in stating that she had a high blood pressure this morning and she is concerned about it. Patient states she think it is/was because of the 4 mins of afib she was having. Patient would like a call back from a nurse because she has more questions. I let patient know I will send a msg to the nurse and they will give her a call back

## 2022-09-03 DIAGNOSIS — I10 Essential (primary) hypertension: Secondary | ICD-10-CM | POA: Diagnosis not present

## 2022-09-03 DIAGNOSIS — I499 Cardiac arrhythmia, unspecified: Secondary | ICD-10-CM | POA: Diagnosis not present

## 2022-09-03 DIAGNOSIS — I669 Occlusion and stenosis of unspecified cerebral artery: Secondary | ICD-10-CM | POA: Diagnosis not present

## 2022-09-03 DIAGNOSIS — E890 Postprocedural hypothyroidism: Secondary | ICD-10-CM | POA: Diagnosis not present

## 2022-09-04 ENCOUNTER — Ambulatory Visit: Payer: Medicare Other | Admitting: Cardiology

## 2022-09-04 ENCOUNTER — Encounter: Payer: Self-pay | Admitting: Cardiology

## 2022-09-04 VITALS — BP 173/69 | HR 74 | Temp 96.5°F | Resp 16 | Ht 65.0 in | Wt 132.8 lb

## 2022-09-04 DIAGNOSIS — E7800 Pure hypercholesterolemia, unspecified: Secondary | ICD-10-CM | POA: Diagnosis not present

## 2022-09-04 DIAGNOSIS — I639 Cerebral infarction, unspecified: Secondary | ICD-10-CM | POA: Diagnosis not present

## 2022-09-04 DIAGNOSIS — Z8669 Personal history of other diseases of the nervous system and sense organs: Secondary | ICD-10-CM

## 2022-09-04 DIAGNOSIS — I10 Essential (primary) hypertension: Secondary | ICD-10-CM | POA: Diagnosis not present

## 2022-09-04 MED ORDER — AMLODIPINE BESYLATE 5 MG PO TABS: 5.0000 mg | ORAL_TABLET | Freq: Every morning | ORAL | 2 refills | Status: DC

## 2022-09-04 MED ORDER — METOPROLOL SUCCINATE ER 50 MG PO TB24
50.0000 mg | ORAL_TABLET | Freq: Every day | ORAL | 1 refills | Status: DC
Start: 2022-09-04 — End: 2022-11-10

## 2022-09-04 NOTE — H&P (View-Only) (Signed)
Primary Physician/Referring:  Reynold Bowen, MD  Patient ID: Felicia Washington, female    DOB: March 28, 1946, 76 y.o.   MRN: [id number]  Chief Complaint  Patient presents with   Atrial Fibrillation   Stroke   HPI:    Felicia Washington  is a 76 y.o. 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  She had no significant extracranial vascular anomalies.  She has no further neurologic deficits.  She underwent [employer] Lux DX ILR on 02/10/2022.  She has had episodes of "A Fib on home monitoring on the EKG which she checks once a day in the morning.  She has no symptoms of palpitations.  Excites regularly and remains asymptomatic.  She is referred by Dr. Reynold Bowen to establish with me.  Past Medical History:  Diagnosis Date   Anemia    Arthritis    "fingers, shoulders" (09/01/2018)   Asthma    Celiac disease    GERD (gastroesophageal reflux disease)    Graves disease    Heart murmur    History of blood transfusion    "related to [name]"   History of gout    fingers   HLD (hyperlipidemia)    HTN (hypertension)    Hypothyroidism    Migraine    "1 q couple years" (09/01/2018)   Pneumonia    "several times in 1 yr" (09/01/2018)   PONV (postoperative nausea and vomiting)    Seasonal allergies    Past Surgical History:  Procedure Laterality Date   CESAREAN [place]  1975   COLONOSCOPY     POLYPECTOMY     REVERSE SHOULDER ARTHROPLASTY Right 09/01/2018   REVERSE SHOULDER ARTHROPLASTY Right 09/01/2018   Procedure: RIGHT REVERSE SHOULDER ARTHROPLASTY;  Surgeon: Justice Britain, MD;  Location: [facility];  Service: Orthopedics;  Laterality:  Right;   SHOULDER ARTHROSCOPY W/ ROTATOR CUFF REPAIR Bilateral    THYROIDECTOMY     TONSILLECTOMY     Family History  Problem Relation Age of Onset   Thyroid disease Mother    Gallstones Mother    Multiple myeloma Father    Bone cancer Father    Celiac disease Sister    Colon polyps Sister    Colon polyps Brother    Diabetes Maternal Grandmother    Heart attack Maternal Grandfather     Social History   Tobacco Use   Smoking status: Never   Smokeless tobacco: Never  Substance Use Topics   Alcohol use: No    Alcohol/week: 0.0 standard drinks of alcohol   Marital Status: Widowed  ROS  Review of Systems  Cardiovascular:  Negative for chest pain, dyspnea on exertion and leg swelling.   Objective      09/04/2022    1:00 PM 07/27/2022    7:37 AM 06/16/2022    3:52 PM  Vitals with BMI  Height 5' 5"  5' 5"  5' 5.5"  Weight 132 lbs 13 oz 134 lbs 134 lbs  BMI 22.1 43.3 29.51  Systolic 884 166 063  Diastolic 69 85 70  Pulse 74 64 70   Today's Vitals   09/04/22 1300  BP: (!) 173/69  Pulse: 74  Resp: 16  Temp: (!) 96.5 F (35.8 C)  TempSrc: Temporal  SpO2: 95%  Weight: 132  lb 12.8 oz (60.2 kg)  Height: 5' 5"  (1.651 m)   Body mass index is 22.1 kg/m.   Physical Exam Neck:     Vascular: No carotid bruit or JVD.  Cardiovascular:     Rate and Rhythm: Normal rate and regular rhythm. Frequent Extrasystoles are present.    Pulses: Intact distal pulses.     Heart sounds: Normal heart sounds. No murmur heard.    No gallop.  Pulmonary:     Effort: Pulmonary effort is normal.     Breath sounds: Normal breath sounds.  Abdominal:     General: Bowel sounds are normal.     Palpations: Abdomen is soft.  Musculoskeletal:     Right lower leg: No edema.     Left lower leg: No edema.    Medications and allergies   Allergies  Allergen Reactions   Adhesive [Tape] Other (See Comments)    Removes skin   Allopurinol Swelling, Hives and Itching   Codeine Nausea Only   Latex  Other (See Comments)    Removes skin     Medication list after today's encounter   Current Outpatient Medications:    amLODipine (NORVASC) 5 MG tablet, Take 1 tablet (5 mg total) by mouth every morning., Disp: 30 tablet, Rfl: 2   aspirin EC 81 MG tablet, Take 81 mg by mouth daily. Swallow whole., Disp: , Rfl:    atorvastatin (LIPITOR) 40 MG tablet, Take 40 mg by mouth in the morning and at bedtime., Disp: , Rfl:    clonazePAM (KLONOPIN) 0.5 MG tablet, Take 0.5 mg by mouth daily as needed., Disp: , Rfl:    Febuxostat 80 MG TABS, Take 80 mg by mouth daily., Disp: , Rfl:    fenofibrate micronized (LOFIBRA) 67 MG capsule, Take 67 mg by mouth daily., Disp: , Rfl: 5   levothyroxine (SYNTHROID, LEVOTHROID) 112 MCG tablet, Take 224 mcg by mouth daily before breakfast., Disp: , Rfl:    montelukast (SINGULAIR) 10 MG tablet, Take 10 mg by mouth at bedtime., Disp: , Rfl:    olmesartan (BENICAR) 40 MG tablet, Take 40 mg by mouth 2 (two) times daily., Disp: , Rfl:    valACYclovir (VALTREX) 500 MG tablet, Take 500 mg by mouth daily as needed (Fever Blisters)., Disp: , Rfl:    hydrochlorothiazide (HYDRODIURIL) 25 MG tablet, Take 25 mg by mouth daily. (Patient not taking: Reported on 09/04/2022), Disp: , Rfl:    metoprolol succinate (TOPROL XL) 50 MG 24 hr tablet, Take 1 tablet (50 mg total) by mouth at bedtime., Disp: 90 tablet, Rfl: 1  Laboratory examination:   Lab Results  Component Value Date   NA 139 01/03/2022   K 3.6 01/03/2022   CO2 26 01/03/2022   GLUCOSE 101 (H) 01/03/2022   BUN 12 01/03/2022   CREATININE 0.75 01/03/2022   CALCIUM 9.5 01/03/2022   GFRNONAA >60 01/03/2022       Latest Ref Rng & Units 01/03/2022    1:55 AM 01/02/2022   10:16 AM 01/01/2022    9:42 AM  BMP  Glucose 70 - 99 mg/dL 101  124  107   BUN 8 - 23 mg/dL 12  10  23    Creatinine 0.44 - 1.00 mg/dL 0.75  0.68  0.82   Sodium 135 - 145 mmol/L 139  138  138   Potassium 3.5 - 5.1 mmol/L 3.6  3.3  4.0   Chloride 98 - 111  mmol/L 107  108  99   CO2 22 -  32 mmol/L 26  24  28    Calcium 8.9 - 10.3 mg/dL 9.5  8.6  10.4       Latest Ref Rng & Units 01/02/2022   10:16 AM 01/01/2022    9:42 AM 01/20/2016    3:03 PM  Hepatic Function  Total Protein 6.5 - 8.1 g/dL 5.3  7.2  7.4   Albumin 3.5 - 5.0 g/dL 3.1  4.4  4.5   AST 15 - 41 U/L 22  29  19    ALT 0 - 44 U/L 16  25  15    Alk Phosphatase 38 - 126 U/L 42  61  41   Total Bilirubin 0.3 - 1.2 mg/dL 0.7  0.9  0.5     Lipid Panel Recent Labs    01/02/22 0500  CHOL 165  TRIG 90  LDLCALC 71  VLDL 18  HDL 76  CHOLHDL 2.2    HEMOGLOBIN A1C Lab Results  Component Value Date   HGBA1C 4.9 01/02/2022   MPG 93.93 01/02/2022   TSH TSH 1.2023: 0.02 (abnormal space 0.40-4.20).  Free T4  3.4 (elevated space 0.8-1.8 NG per mL) Radiology:   CT scan and MRI report reviewed  Cardiac Studies:   Echocardiogram 01/02/2022:   1. Left ventricular ejection fraction, by estimation, is 60 to 65%. The  left ventricle has normal function. The left ventricle has no regional  wall motion abnormalities. Left ventricular diastolic parameters are consistent with Grade I diastolic dysfunction (impaired relaxation). Elevated left ventricular end-diastolic pressure.   2. Right ventricular systolic function is normal. The right ventricular size is normal. There is mildly elevated pulmonary artery systolic pressure.   3. Left atrial size was severely dilated.   4. The mitral valve is normal in structure. Trivial mitral valve regurgitation. No evidence of mitral stenosis.   5. The aortic valve is tricuspid. Aortic valve regurgitation is not visualized. No aortic stenosis is present.   6. The inferior vena cava is normal in size with greater than 50% respiratory variability, suggesting right atrial pressure of 3 mmHg.   EKG:   EKG 09/04/2022: Normal sinus rhythm at rate of 70 bpm, normal axis, no evidence of ischemia, normal EKG.    Assessment     ICD-10-CM   1. Cryptogenic stroke  (HCC)  I63.9 EKG 12-Lead    2. Essential hypertension  I10     3. History of migraine with aura  Z86.69     4. Pure hypercholesterolemia  E78.00        Orders Placed This Encounter  Procedures   EKG 12-Lead    Meds ordered this encounter  Medications   metoprolol succinate (TOPROL XL) 50 MG 24 hr tablet    Sig: Take 1 tablet (50 mg total) by mouth at bedtime.    Dispense:  90 tablet    Refill:  1   amLODipine (NORVASC) 5 MG tablet    Sig: Take 1 tablet (5 mg total) by mouth every morning.    Dispense:  30 tablet    Refill:  2    Medications Discontinued During This Encounter  Medication Reason   ampicillin (PRINCIPEN) 500 MG capsule    Omeprazole 20 MG TBDD    metoprolol succinate (TOPROL XL) 25 MG 24 hr tablet Reorder     Recommendations:   Felicia Washington is a 76 y.o.  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  She had no significant extracranial vascular anomalies.  She has no further neurologic deficits.  She underwent [employer] Lux DX ILR on 02/10/2022.  She has had episodes of "A Fib on home monitoring on the EKG which she checks once a day in the morning.  She has no symptoms of palpitations.  Excites regularly and remains asymptomatic.  I reviewed her home EKG monitoring.  She has frequent PACs and quadrigeminal pattern and rare PVCs.  There is no documented atrial fibrillation.  She was also mildly hypothyroid, Synthroid dose has been reduced as of yesterday.  Her loop recorder transmission has been transferred over to our clinic and we will continue to monitor this closely.  I reviewed some of the strips  and at least closely.  I do not see atrial fibrillation.    As she does not have any significant atherosclerotic burden, history of migraine with aura, although 76 years of age, paradoxical embolus from PFO and or aortic atherosclerotic embolization from aortic arch.  In view of hypertension and hyperlipidemia as risk factors leading to stroke cannot be excluded as well.  Best to proceed with TEE to further evaluate.  Complications including esophageal perforation, aspiration pneumonia and minor trauma discussed with the patient and her twin sister at the bedside.  Secondary prevention with stroke includes control of blood pressure.  I would like to have goal blood pressure around 130 mmHg.  I have increased the dose of metoprolol succinate from 25 to 50 mg daily and added amlodipine 5 mg daily.    Patient was also on a diuretic but has not been taking this as it causes her to have "dehydration".  If blood pressure is not controlled, will consider reinitiating this.  I would like to see her back in 6 weeks for follow-up.  This was a 60-minute office visit encounter in review of external records, external labs, radiologic reports which are noted in the epic electronic health record.   Adrian Prows, MD, [facility] 09/04/2022, 1:44 PM Office: [phone]

## 2022-09-04 NOTE — Progress Notes (Signed)
Primary Physician/Referring:  Reynold Bowen, MD  Patient ID: [name], female    DOB: Jan 04, 1946, 76 y.o.   MRN: [id number]  Chief Complaint  Patient presents with   Atrial Fibrillation   Stroke   HPI:    Felicia Washington  is a 76 y.o. 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  She had no significant extracranial vascular anomalies.  She has no further neurologic deficits.  She underwent [employer] Lux DX ILR on 02/10/2022.  She has had episodes of "A Fib on home monitoring on the EKG which she checks once a day in the morning.  She has no symptoms of palpitations.  Excites regularly and remains asymptomatic.  She is referred by Dr. Reynold Bowen to establish with me.  Past Medical History:  Diagnosis Date   Anemia    Arthritis    "fingers, shoulders" (09/01/2018)   Asthma    Celiac disease    GERD (gastroesophageal reflux disease)    Graves disease    Heart murmur    History of blood transfusion    "related to c-section"   History of gout    fingers   HLD (hyperlipidemia)    HTN (hypertension)    Hypothyroidism    Migraine    "1 q couple years" (09/01/2018)   Pneumonia    "several times in 1 yr" (09/01/2018)   PONV (postoperative nausea and vomiting)    Seasonal allergies    Past Surgical History:  Procedure Laterality Date   CESAREAN SECTION  1975   COLONOSCOPY     POLYPECTOMY     REVERSE SHOULDER ARTHROPLASTY Right 09/01/2018   REVERSE SHOULDER ARTHROPLASTY Right 09/01/2018   Procedure: RIGHT REVERSE SHOULDER ARTHROPLASTY;  Surgeon: Justice Britain, MD;  Location: [facility];  Service: Orthopedics;  Laterality:  Right;   SHOULDER ARTHROSCOPY W/ ROTATOR CUFF REPAIR Bilateral    THYROIDECTOMY     TONSILLECTOMY     Family History  Problem Relation Age of Onset   Thyroid disease Mother    Gallstones Mother    Multiple myeloma Father    Bone cancer Father    Celiac disease Sister    Colon polyps Sister    Colon polyps Brother    Diabetes Maternal Grandmother    Heart attack Maternal Grandfather     Social History   Tobacco Use   Smoking status: Never   Smokeless tobacco: Never  Substance Use Topics   Alcohol use: No    Alcohol/week: 0.0 standard drinks of alcohol   Marital Status: Widowed  ROS  Review of Systems  Cardiovascular:  Negative for chest pain, dyspnea on exertion and leg swelling.   Objective      09/04/2022    1:00 PM 07/27/2022    7:37 AM 06/16/2022    3:52 PM  Vitals with BMI  Height 5' 5"  5' 5"  5' 5.5"  Weight 132 lbs 13 oz 134 lbs 134 lbs  BMI 22.1 85.2 77.82  Systolic 423 536 144  Diastolic 69 85 70  Pulse 74 64 70   Today's Vitals   09/04/22 1300  BP: (!) 173/69  Pulse: 74  Resp: 16  Temp: (!) 96.5 F (35.8 C)  TempSrc: Temporal  SpO2: 95%  Weight: 132  lb 12.8 oz (60.2 kg)  Height: 5' 5"  (1.651 m)   Body mass index is 22.1 kg/m.   Physical Exam Neck:     Vascular: No carotid bruit or JVD.  Cardiovascular:     Rate and Rhythm: Normal rate and regular rhythm. Frequent Extrasystoles are present.    Pulses: Intact distal pulses.     Heart sounds: Normal heart sounds. No murmur heard.    No gallop.  Pulmonary:     Effort: Pulmonary effort is normal.     Breath sounds: Normal breath sounds.  Abdominal:     General: Bowel sounds are normal.     Palpations: Abdomen is soft.  Musculoskeletal:     Right lower leg: No edema.     Left lower leg: No edema.    Medications and allergies   Allergies  Allergen Reactions   Adhesive [Tape] Other (See Comments)    Removes skin   Allopurinol Swelling, Hives and Itching   Codeine Nausea Only   Latex  Other (See Comments)    Removes skin     Medication list after today's encounter   Current Outpatient Medications:    amLODipine (NORVASC) 5 MG tablet, Take 1 tablet (5 mg total) by mouth every morning., Disp: 30 tablet, Rfl: 2   aspirin EC 81 MG tablet, Take 81 mg by mouth daily. Swallow whole., Disp: , Rfl:    atorvastatin (LIPITOR) 40 MG tablet, Take 40 mg by mouth in the morning and at bedtime., Disp: , Rfl:    clonazePAM (KLONOPIN) 0.5 MG tablet, Take 0.5 mg by mouth daily as needed., Disp: , Rfl:    Febuxostat 80 MG TABS, Take 80 mg by mouth daily., Disp: , Rfl:    fenofibrate micronized (LOFIBRA) 67 MG capsule, Take 67 mg by mouth daily., Disp: , Rfl: 5   levothyroxine (SYNTHROID, LEVOTHROID) 112 MCG tablet, Take 224 mcg by mouth daily before breakfast., Disp: , Rfl:    montelukast (SINGULAIR) 10 MG tablet, Take 10 mg by mouth at bedtime., Disp: , Rfl:    olmesartan (BENICAR) 40 MG tablet, Take 40 mg by mouth 2 (two) times daily., Disp: , Rfl:    valACYclovir (VALTREX) 500 MG tablet, Take 500 mg by mouth daily as needed (Fever Blisters)., Disp: , Rfl:    hydrochlorothiazide (HYDRODIURIL) 25 MG tablet, Take 25 mg by mouth daily. (Patient not taking: Reported on 09/04/2022), Disp: , Rfl:    metoprolol succinate (TOPROL XL) 50 MG 24 hr tablet, Take 1 tablet (50 mg total) by mouth at bedtime., Disp: 90 tablet, Rfl: 1  Laboratory examination:   Lab Results  Component Value Date   NA 139 01/03/2022   K 3.6 01/03/2022   CO2 26 01/03/2022   GLUCOSE 101 (H) 01/03/2022   BUN 12 01/03/2022   CREATININE 0.75 01/03/2022   CALCIUM 9.5 01/03/2022   GFRNONAA >60 01/03/2022       Latest Ref Rng & Units 01/03/2022    1:55 AM 01/02/2022   10:16 AM 01/01/2022    9:42 AM  BMP  Glucose 70 - 99 mg/dL 101  124  107   BUN 8 - 23 mg/dL 12  10  23    Creatinine 0.44 - 1.00 mg/dL 0.75  0.68  0.82   Sodium 135 - 145 mmol/L 139  138  138   Potassium 3.5 - 5.1 mmol/L 3.6  3.3  4.0   Chloride 98 - 111  mmol/L 107  108  99   CO2 22 -  32 mmol/L 26  24  28    Calcium 8.9 - 10.3 mg/dL 9.5  8.6  10.4       Latest Ref Rng & Units 01/02/2022   10:16 AM 01/01/2022    9:42 AM 01/20/2016    3:03 PM  Hepatic Function  Total Protein 6.5 - 8.1 g/dL 5.3  7.2  7.4   Albumin 3.5 - 5.0 g/dL 3.1  4.4  4.5   AST 15 - 41 U/L 22  29  19    ALT 0 - 44 U/L 16  25  15    Alk Phosphatase 38 - 126 U/L 42  61  41   Total Bilirubin 0.3 - 1.2 mg/dL 0.7  0.9  0.5     Lipid Panel Recent Labs    01/02/22 0500  CHOL 165  TRIG 90  LDLCALC 71  VLDL 18  HDL 76  CHOLHDL 2.2    HEMOGLOBIN A1C Lab Results  Component Value Date   HGBA1C 4.9 01/02/2022   MPG 93.93 01/02/2022   TSH TSH 1.2023: 0.02 (abnormal space 0.40-4.20).  Free T4  3.4 (elevated space 0.8-1.8 NG per mL) Radiology:   CT scan and MRI report reviewed  Cardiac Studies:   Echocardiogram 01/02/2022:   1. Left ventricular ejection fraction, by estimation, is 60 to 65%. The  left ventricle has normal function. The left ventricle has no regional  wall motion abnormalities. Left ventricular diastolic parameters are consistent with Grade I diastolic dysfunction (impaired relaxation). Elevated left ventricular end-diastolic pressure.   2. Right ventricular systolic function is normal. The right ventricular size is normal. There is mildly elevated pulmonary artery systolic pressure.   3. Left atrial size was severely dilated.   4. The mitral valve is normal in structure. Trivial mitral valve regurgitation. No evidence of mitral stenosis.   5. The aortic valve is tricuspid. Aortic valve regurgitation is not visualized. No aortic stenosis is present.   6. The inferior vena cava is normal in size with greater than 50% respiratory variability, suggesting right atrial pressure of 3 mmHg.   EKG:   EKG 09/04/2022: Normal sinus rhythm at rate of 70 bpm, normal axis, no evidence of ischemia, normal EKG.    Assessment     ICD-10-CM   1. Cryptogenic stroke  (HCC)  I63.9 EKG 12-Lead    2. Essential hypertension  I10     3. History of migraine with aura  Z86.69     4. Pure hypercholesterolemia  E78.00        Orders Placed This Encounter  Procedures   EKG 12-Lead    Meds ordered this encounter  Medications   metoprolol succinate (TOPROL XL) 50 MG 24 hr tablet    Sig: Take 1 tablet (50 mg total) by mouth at bedtime.    Dispense:  90 tablet    Refill:  1   amLODipine (NORVASC) 5 MG tablet    Sig: Take 1 tablet (5 mg total) by mouth every morning.    Dispense:  30 tablet    Refill:  2    Medications Discontinued During This Encounter  Medication Reason   ampicillin (PRINCIPEN) 500 MG capsule    Omeprazole 20 MG TBDD    metoprolol succinate (TOPROL XL) 25 MG 24 hr tablet Reorder     Recommendations:   ROCIO ROAM is a 76 y.o.  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  She had no significant extracranial vascular anomalies.  She has no further neurologic deficits.  She underwent [employer] DX ILR on 02/10/2022.  She has had episodes of "A Fib on home monitoring on the EKG which she checks once a day in the morning.  She has no symptoms of palpitations.  Excites regularly and remains asymptomatic.  I reviewed her home EKG monitoring.  She has frequent PACs and quadrigeminal pattern and rare PVCs.  There is no documented atrial fibrillation.  She was also mildly hypothyroid, Synthroid dose has been reduced as of yesterday.  Her loop recorder transmission has been transferred over to our clinic and we will continue to monitor this closely.  I reviewed some of the strips  and at least closely.  I do not see atrial fibrillation.    As she does not have any significant atherosclerotic burden, history of migraine with aura, although 76 years of age, paradoxical embolus from PFO and or aortic atherosclerotic embolization from aortic arch.  In view of hypertension and hyperlipidemia as risk factors leading to stroke cannot be excluded as well.  Best to proceed with TEE to further evaluate.  Complications including esophageal perforation, aspiration pneumonia and minor trauma discussed with the patient and her twin sister at the bedside.  Secondary prevention with stroke includes control of blood pressure.  I would like to have goal blood pressure around 130 mmHg.  I have increased the dose of metoprolol succinate from 25 to 50 mg daily and added amlodipine 5 mg daily.    Patient was also on a diuretic but has not been taking this as it causes her to have "dehydration".  If blood pressure is not controlled, will consider reinitiating this.  I would like to see her back in 6 weeks for follow-up.  This was a 60-minute office visit encounter in review of external records, external labs, radiologic reports which are noted in the epic electronic health record.   Adrian Prows, MD, [facility] 09/04/2022, 1:44 PM Office: [phone]

## 2022-09-07 ENCOUNTER — Ambulatory Visit (INDEPENDENT_AMBULATORY_CARE_PROVIDER_SITE_OTHER): Payer: Medicare Other | Admitting: Neurology

## 2022-09-07 ENCOUNTER — Encounter: Payer: Self-pay | Admitting: Neurology

## 2022-09-07 VITALS — BP 147/79 | HR 63 | Ht 60.0 in | Wt 134.4 lb

## 2022-09-07 DIAGNOSIS — Z820 Family history of epilepsy and other diseases of the nervous system: Secondary | ICD-10-CM | POA: Diagnosis not present

## 2022-09-07 DIAGNOSIS — G4700 Insomnia, unspecified: Secondary | ICD-10-CM | POA: Diagnosis not present

## 2022-09-07 DIAGNOSIS — I639 Cerebral infarction, unspecified: Secondary | ICD-10-CM | POA: Diagnosis not present

## 2022-09-07 DIAGNOSIS — E663 Overweight: Secondary | ICD-10-CM | POA: Diagnosis not present

## 2022-09-07 DIAGNOSIS — R351 Nocturia: Secondary | ICD-10-CM | POA: Diagnosis not present

## 2022-09-07 NOTE — Patient Instructions (Signed)

## 2022-09-07 NOTE — Progress Notes (Signed)
Subjective:    Patient ID: Felicia Washington is a 76 y.o. female.  HPI    Star Age, MD, PhD [facility] [address], [address] [place], [place] [address]  Dear Felicia Washington and [name],   I saw your patient, Felicia Washington, upon your kind request in my sleep clinic today for initial consultation of her sleep disorder, in particular, concern for underlying obstructive sleep apnea.  The patient is accompanied by her daughter, Felicia Washington, today. 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  She is not sure if she snores, no one has complained about it.  She lives alone.  She is widowed, she has 3 grown children.  Bedtime is generally between 10 and 10:30 PM and rise time around 6:30 AM.  She is retired, she worked in [employer].  She had a tonsillectomy as a child.  She takes clonazepam at night for sleep per PCP.  She has a history of anxiety and has been on Wellbutrin.  She does not drink any caffeine.  She is a non-smoker and does not currently drink any alcohol.  She has no pets in the household.  She does have a TV on at night before falling asleep but turns it off.  She has nocturia about once or twice per average night.  Her daughter has sleep apnea and uses a CPAP machine.  Patient denies any recurrent nocturnal or morning headaches.   I reviewed your office note from 07/27/2022.  Her Epworth sleepiness score is 0 out of 24, fatigue severity score is 12 out of 63.  Her Past Medical History Is Significant For: Past Medical History:  Diagnosis Date   Anemia     Arthritis    "fingers, shoulders" (09/01/2018)   Asthma    Celiac disease    GERD (gastroesophageal reflux disease)    Graves disease    Heart murmur    History of blood transfusion    "related to c-section"   History of gout    fingers   HLD (hyperlipidemia)    HTN (hypertension)    Hypothyroidism    Migraine    "1 q couple years" (09/01/2018)   Pneumonia    "several times in 1 yr" (09/01/2018)   PONV (postoperative nausea and vomiting)    Seasonal allergies    Stroke [facility])    jan 2023    Her Past Surgical History Is Significant For: Past Surgical History:  Procedure Laterality Date   CESAREAN SECTION  1975   COLONOSCOPY     POLYPECTOMY     REVERSE SHOULDER ARTHROPLASTY Right 09/01/2018   REVERSE SHOULDER ARTHROPLASTY Right 09/01/2018   Procedure: RIGHT REVERSE SHOULDER ARTHROPLASTY;  Surgeon: Justice Britain, MD;  Location: [place];  Service: Orthopedics;  Laterality: Right;   SHOULDER ARTHROSCOPY W/ ROTATOR CUFF REPAIR Bilateral    THYROIDECTOMY     TONSILLECTOMY      Her Family History Is Significant For: Family History  Problem Relation Age of Onset   Thyroid disease Mother    Gallstones Mother    Multiple myeloma Father    Bone cancer Father    Celiac disease Sister    Colon polyps Sister  Colon polyps Brother    Diabetes Maternal Grandmother    Heart attack Maternal Grandfather    Sleep apnea Daughter     Her Social History Is Significant For: Social History   Socioeconomic History   Marital status: Widowed    Spouse name: Not on file   Number of children: 3   Years of education: Not on file   Highest education level: Not on file  Occupational History   Occupation: sales  Tobacco Use   Smoking status: Never   Smokeless tobacco: Never  Vaping Use   Vaping Use: Never used  Substance and Sexual Activity   Alcohol use: No    Alcohol/week: 0.0 standard drinks of alcohol   Drug use: No   Sexual activity: Not Currently  Other Topics Concern   Not on  file  Social History Narrative   Not on file   Social Determinants of Health   Financial Resource Strain: Not on file  Food Insecurity: No Food Insecurity (07/28/2022)   Hunger Vital Sign    Worried About Running Out of Food in the Last Year: Never true    Ran Out of Food in the Last Year: Never true  Transportation Needs: No Transportation Needs (07/28/2022)   PRAPARE - Hydrologist (Medical): No    Lack of Transportation (Non-Medical): No  Physical Activity: Not on file  Stress: Not on file  Social Connections: Not on file    Her Allergies Are:  Allergies  Allergen Reactions   Adhesive [Tape] Other (See Comments)    Removes skin   Allopurinol Swelling, Hives and Itching   Codeine Nausea Only   Latex Other (See Comments)    Removes skin  :   Her Current Medications Are:  Outpatient Encounter Medications as of 09/07/2022  Medication Sig   amLODipine (NORVASC) 5 MG tablet Take 1 tablet (5 mg total) by mouth every morning.   aspirin EC 81 MG tablet Take 81 mg by mouth daily. Swallow whole.   atorvastatin (LIPITOR) 40 MG tablet Take 40 mg by mouth in the morning and at bedtime.   clonazePAM (KLONOPIN) 0.5 MG tablet Take 0.5 mg by mouth daily as needed.   Febuxostat 80 MG TABS Take 80 mg by mouth daily.   fenofibrate micronized (LOFIBRA) 67 MG capsule Take 67 mg by mouth daily.   levothyroxine (SYNTHROID, LEVOTHROID) 112 MCG tablet Take 224 mcg by mouth daily before breakfast.   metoprolol succinate (TOPROL XL) 50 MG 24 hr tablet Take 1 tablet (50 mg total) by mouth at bedtime.   montelukast (SINGULAIR) 10 MG tablet Take 10 mg by mouth at bedtime.   olmesartan (BENICAR) 40 MG tablet Take 40 mg by mouth 2 (two) times daily.   valACYclovir (VALTREX) 500 MG tablet Take 500 mg by mouth as needed (Fever Blisters).   hydrochlorothiazide (HYDRODIURIL) 25 MG tablet Take 25 mg by mouth daily. (Patient not taking: Reported on 09/04/2022)   No  facility-administered encounter medications on file as of 09/07/2022.  :   Review of Systems:  Out of a complete 14 point review of systems, all are reviewed and negative with the exception of these symptoms as listed below:  Review of Systems  Neurological:        Pt here for sleep consult Pt has hypertension, hx of stroke  Pt denies snoring,fatigue ,headaches, sleep study,CPAP ,cpap machine     ESS :0 FSS:12    Objective:  Neurological Exam  Physical Exam  Physical Examination:   Vitals:   09/07/22 1036  BP: (!) 147/79  Pulse: 63    General Examination: The patient is a very pleasant 76 y.o. female in no acute distress. She appears well-developed and well-nourished and well groomed.   HEENT: Normocephalic, atraumatic, pupils are equal, round and reactive to light, extraocular tracking is good without limitation to gaze excursion or nystagmus noted. Hearing is grossly intact. Face is symmetric with normal facial animation. Speech is clear with no dysarthria noted. There is no hypophonia. There is no lip, neck/head, jaw or voice tremor. Neck is supple with full range of passive and active motion. There are no carotid bruits on auscultation. Oropharynx exam reveals: mild mouth dryness, adequate dental hygiene and mild airway crowding secondary to small airway entry and redundant soft palate, tonsils absent, Mallampati class II.  Neck circumference of 14-1/4 inches.  She has a mild to moderate overbite.  Tongue protrudes centrally and palate elevates symmetrically.   Chest: Clear to auscultation without wheezing, rhonchi or crackles noted.  Heart: S1+S2+0, regular and normal without murmurs, rubs or gallops noted.   Abdomen: Soft, non-tender and non-distended with normal bowel sounds appreciated on auscultation.  Extremities: There is no pitting edema in the distal lower extremities bilaterally.   Skin: Warm and dry without trophic changes noted.   Musculoskeletal: exam reveals  no obvious joint deformities, tenderness or joint swelling or erythema.   Neurologically:  Mental status: The patient is awake, alert and oriented in all 4 spheres. Her immediate and remote memory, attention, language skills and fund of knowledge are appropriate. There is no evidence of aphasia, agnosia, apraxia or anomia. Speech is clear with normal prosody and enunciation. Thought process is linear. Mood is normal and affect is normal.  Cranial nerves II - XII are as described above under HEENT exam.  Motor exam: Normal bulk, strength and tone is noted. There is no obvious tremor.  Fine motor skills and coordination: grossly intact.  Cerebellar testing: No dysmetria or intention tremor. There is no truncal or gait ataxia.  Sensory exam: intact to light touch in the upper and lower extremities.  Gait, station and balance: She stands easily. No veering to one side is noted. No leaning to one side is noted. Posture is age-appropriate and stance is narrow based. Gait shows normal stride length and normal pace. No problems turning are noted.   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 A laboratory attended sleep study is considered gold standard for evaluation of sleep disordered breathing and is recommended at this time and clinically justified.   I had a long chat with the patient and her daughter about my findings and the diagnosis of sleep apnea, particularly OSA, its prognosis and  treatment options. We talked about medical/conservative treatments, surgical interventions and non-pharmacological approaches for symptom control. I explained, in particular, the risks and ramifications of untreated moderate to severe OSA, especially with respect to developing cardiovascular disease down the road, including congestive heart failure (CHF), difficult to treat hypertension, cardiac arrhythmias (particularly A-fib), neurovascular complications including TIA, stroke  and dementia. Even type 2 diabetes has, in part, been linked to untreated OSA. Symptoms of untreated OSA may include (but may not be limited to) daytime sleepiness, nocturia (i.e. frequent nighttime urination), memory problems, mood irritability and suboptimally controlled or worsening mood disorder such as depression and/or anxiety, lack of energy, lack of motivation, physical discomfort, as well as recurrent headaches, especially morning or nocturnal headaches. We talked about the importance of maintaining a healthy lifestyle and striving for healthy weight. In addition, we talked about the importance of striving for and maintaining good sleep hygiene. I recommended the following at this time: sleep study.  I outlined the differences between a laboratory attended sleep study which is considered more comprehensive and accurate over the option of a home sleep test (HST); the latter may lead to underestimation of sleep disordered breathing in some instances and does not help with diagnosing upper airway resistance syndrome and is not accurate enough to diagnose primary central sleep apnea typically. I explained the different sleep test procedures to the patient in detail and also outlined possible surgical and non-surgical treatment options of OSA, including the use of a pressure airway pressure (PAP) device (ie CPAP, AutoPAP/APAP or BiPAP in certain circumstances), a custom-made dental device (aka oral appliance, which would require a  referral to a specialist dentist or orthodontist typically, and is generally speaking not considered a good choice for patients with full dentures or edentulous state), upper airway surgical options, such as traditional UPPP (which is not considered a first-line treatment) or the Inspire device (hypoglossal nerve stimulator, which would involve a referral for consultation with an ENT surgeon, after careful selection, following inclusion criteria). I explained the PAP treatment option to the patient in detail, as this is generally considered first-line treatment.  The patient indicated that she would be willing to try PAP therapy, if the need arises. I explained the importance of being compliant with PAP treatment, not only for insurance purposes but primarily to improve patient's symptoms symptoms, and for the patient's long term health benefit, including to reduce Her cardiovascular risks longer-term.    We will pick up our discussion about the next steps and treatment options after testing.  We will keep her posted as to the test results by phone call and/or MyChart messaging where possible.  We will plan to follow-up in sleep clinic accordingly as well.  I answered all their questions today and the patient and her daughter were in agreement.   I encouraged them to call with any interim questions, concerns, problems or updates or email [place] through [place].  Generally speaking, sleep test authorizations may take up to 2 weeks, sometimes less, sometimes longer, the patient is encouraged to get in touch with [place] if they do not hear back from the sleep lab staff directly within the next 2 weeks.  Thank you very much for allowing me to participate in the care of this nice patient. If I can be of any further assistance to you please do not hesitate to talk to me.    Sincerely,   Star Age, MD, PhD

## 2022-09-10 ENCOUNTER — Encounter: Payer: Self-pay | Admitting: Cardiology

## 2022-09-10 ENCOUNTER — Telehealth: Payer: Self-pay | Admitting: Home Health

## 2022-09-10 NOTE — Telephone Encounter (Signed)
Patient called after-hours line reporting that she has switched cardiologist to Dr. Einar Gip, used to see Dr. Quentin Ore, has a loop recorder implanted, but she was told that she is no longer enrolled with [employer].  Unclear why she is no longer enrolled.  Advised patient to communicate with Dr. Irven Shelling office regarding her concern.  Will forward this information to Dr. Quentin Ore.

## 2022-09-11 NOTE — Telephone Encounter (Signed)
I have already contacted [place] to help with enrolling this patient. When I tried to enroll her, I didn't not see the patients model number. Solution is still pending.

## 2022-09-11 NOTE — Telephone Encounter (Signed)
Patient has spoken to Gaye Alken, [place] regarding her looper

## 2022-09-14 NOTE — Telephone Encounter (Signed)
Felicia Washington asked that you contact him at [employer].

## 2022-09-14 NOTE — Telephone Encounter (Signed)
I discussed with the patient, she prefers me to continue to monitor the loop.  I have initiated transfer to our clinic.  She will be coming at 2:30 PM tomorrow to reset her monitor.

## 2022-09-15 ENCOUNTER — Ambulatory Visit: Payer: Medicare Other | Admitting: Internal Medicine

## 2022-09-15 DIAGNOSIS — Z95818 Presence of other cardiac implants and grafts: Secondary | ICD-10-CM | POA: Diagnosis not present

## 2022-09-15 DIAGNOSIS — I639 Cerebral infarction, unspecified: Secondary | ICD-10-CM | POA: Insufficient documentation

## 2022-09-15 DIAGNOSIS — Z4509 Encounter for adjustment and management of other cardiac device: Secondary | ICD-10-CM

## 2022-09-15 NOTE — Progress Notes (Signed)
Chief Complaint  Patient presents with   Loop check/Reset     Cerebrovascular accident (CVA), unspecified mechanism ([place])  Encounter for loop recorder check  [employer] LUX Loop    Device setting:  R wave amplitude 0.4 mV.   Programmed:  AF detection to Medium. HVR 160/min. Bradycardia 35 BPM Sudden rate drop 50 bpm Asystole 3 Sec Patient Trigger: yes     ICD-10-CM   1. Cerebrovascular accident (CVA), unspecified mechanism ([place])  I63.9     2. Encounter for loop recorder check  Z45.09     3. [employer] LUX Loop  Z95.818         Secretary, DO, [facility] 09/15/2022, 2:48 PM Office: [phone] Fax: [phone] Pager: [phone]

## 2022-09-16 ENCOUNTER — Telehealth: Payer: Self-pay | Admitting: Neurology

## 2022-09-16 NOTE — Telephone Encounter (Signed)
NPSG- Medicare/aetna supp no auth req.  Patient is scheduled at [facility] for 10/13/22 at 8 pm.  Mailed packet to the patient.

## 2022-09-23 ENCOUNTER — Other Ambulatory Visit: Payer: Self-pay

## 2022-09-23 ENCOUNTER — Encounter (HOSPITAL_COMMUNITY): Payer: Self-pay | Admitting: Cardiology

## 2022-09-23 ENCOUNTER — Ambulatory Visit (HOSPITAL_COMMUNITY): Payer: Medicare Other | Admitting: Certified Registered Nurse Anesthetist

## 2022-09-23 ENCOUNTER — Ambulatory Visit (HOSPITAL_COMMUNITY)
Admission: RE | Admit: 2022-09-23 | Discharge: 2022-09-23 | Disposition: A | Discharge status: Home / Self Care | Payer: Medicare Other | Attending: Cardiology | Admitting: Cardiology

## 2022-09-23 ENCOUNTER — Ambulatory Visit (HOSPITAL_BASED_OUTPATIENT_CLINIC_OR_DEPARTMENT_OTHER): Payer: Medicare Other | Admitting: Certified Registered Nurse Anesthetist

## 2022-09-23 ENCOUNTER — Ambulatory Visit (HOSPITAL_COMMUNITY)
Admission: RE | Admit: 2022-09-23 | Discharge: 2022-09-23 | Disposition: A | Discharge status: Home / Self Care | Payer: Medicare Other | Source: Ambulatory Visit | Attending: Cardiology | Admitting: Cardiology

## 2022-09-23 ENCOUNTER — Encounter (HOSPITAL_COMMUNITY)
Admission: RE | Disposition: A | Discharge status: Home / Self Care | Payer: Self-pay | Source: Home / Self Care | Attending: Cardiology

## 2022-09-23 DIAGNOSIS — I10 Essential (primary) hypertension: Secondary | ICD-10-CM | POA: Insufficient documentation

## 2022-09-23 DIAGNOSIS — I639 Cerebral infarction, unspecified: Secondary | ICD-10-CM

## 2022-09-23 DIAGNOSIS — Q2112 Patent foramen ovale: Secondary | ICD-10-CM

## 2022-09-23 DIAGNOSIS — E7800 Pure hypercholesterolemia, unspecified: Secondary | ICD-10-CM | POA: Diagnosis not present

## 2022-09-23 DIAGNOSIS — Z79890 Hormone replacement therapy (postmenopausal): Secondary | ICD-10-CM | POA: Diagnosis not present

## 2022-09-23 DIAGNOSIS — E039 Hypothyroidism, unspecified: Secondary | ICD-10-CM | POA: Diagnosis not present

## 2022-09-23 DIAGNOSIS — E890 Postprocedural hypothyroidism: Secondary | ICD-10-CM | POA: Insufficient documentation

## 2022-09-23 DIAGNOSIS — E785 Hyperlipidemia, unspecified: Secondary | ICD-10-CM | POA: Diagnosis not present

## 2022-09-23 DIAGNOSIS — J45909 Unspecified asthma, uncomplicated: Secondary | ICD-10-CM | POA: Diagnosis not present

## 2022-09-23 DIAGNOSIS — I872 Venous insufficiency (chronic) (peripheral): Secondary | ICD-10-CM | POA: Diagnosis not present

## 2022-09-23 DIAGNOSIS — I081 Rheumatic disorders of both mitral and tricuspid valves: Secondary | ICD-10-CM | POA: Insufficient documentation

## 2022-09-23 HISTORY — PX: BUBBLE STUDY: SHX6837

## 2022-09-23 HISTORY — PX: TEE WITHOUT CARDIOVERSION: SHX5443

## 2022-09-23 SURGERY — ECHOCARDIOGRAM, TRANSESOPHAGEAL
Anesthesia: Monitor Anesthesia Care

## 2022-09-23 MED ORDER — PROPOFOL 500 MG/50ML IV EMUL
INTRAVENOUS | Status: DC | PRN
  Administered 2022-09-23: 125 ug/kg/min via INTRAVENOUS

## 2022-09-23 MED ORDER — PROPOFOL 10 MG/ML IV BOLUS
INTRAVENOUS | Status: DC | PRN
  Administered 2022-09-23 (×2): 10 mg via INTRAVENOUS
  Administered 2022-09-23: 20 mg via INTRAVENOUS

## 2022-09-23 MED ORDER — EPHEDRINE SULFATE-NACL 50-0.9 MG/10ML-% IV SOSY
PREFILLED_SYRINGE | INTRAVENOUS | Status: DC | PRN
  Administered 2022-09-23: 2.5 mg via INTRAVENOUS
  Administered 2022-09-23: 7.5 mg via INTRAVENOUS

## 2022-09-23 MED ORDER — SODIUM CHLORIDE 0.9 % IV SOLN: INTRAVENOUS | Status: DC

## 2022-09-23 MED ORDER — LIDOCAINE 2% (20 MG/ML) 5 ML SYRINGE
INTRAMUSCULAR | Status: DC | PRN
  Administered 2022-09-23: 20 mg via INTRAVENOUS

## 2022-09-23 NOTE — Anesthesia Preprocedure Evaluation (Addendum)
Anesthesia Evaluation  Patient identified by MRN, date of birth, ID band Patient awake    Reviewed: Allergy & Precautions, NPO status , Patient's Chart, lab work & pertinent test results  Airway Mallampati: II  TM Distance: >3 FB Neck ROM: Full    Dental  (+) Chipped,    Pulmonary asthma ,    Pulmonary exam normal        Cardiovascular hypertension, Pt. on medications and Pt. on home beta blockers Normal cardiovascular exam     Neuro/Psych  Headaches, CVA, No Residual Symptoms negative psych ROS   GI/Hepatic Neg liver ROS, GERD  Medicated and Controlled,  Endo/Other  Hypothyroidism   Renal/GU negative Renal ROS     Musculoskeletal  (+) Arthritis ,   Abdominal   Peds  Hematology negative hematology ROS (+)   Anesthesia Other Findings cryptogenic stroke  Reproductive/Obstetrics                            Anesthesia Physical Anesthesia Plan  ASA: 3  Anesthesia Plan: MAC   Post-op Pain Management:    Induction: Intravenous  PONV Risk Score and Plan: 2 and Propofol infusion and Treatment may vary due to age or medical condition  Airway Management Planned: Nasal Cannula  Additional Equipment:   Intra-op Plan:   Post-operative Plan:   Informed Consent: I have reviewed the patients History and Physical, chart, labs and discussed the procedure including the risks, benefits and alternatives for the proposed anesthesia with the patient or authorized representative who has indicated his/her understanding and acceptance.     Dental advisory given  Plan Discussed with: CRNA  Anesthesia Plan Comments:         Anesthesia Quick Evaluation

## 2022-09-23 NOTE — Anesthesia Procedure Notes (Signed)
Procedure Name: MAC Date/Time: 09/23/2022 2:02 PM  Performed by: Dorthea Cove, CRNAPre-anesthesia Checklist: Patient identified, Emergency Drugs available, Suction available, Patient being monitored and Timeout performed Patient Re-evaluated:Patient Re-evaluated prior to induction Oxygen Delivery Method: Nasal cannula Preoxygenation: Pre-oxygenation with 100% oxygen Induction Type: IV induction Placement Confirmation: positive ETCO2 and CO2 detector Dental Injury: Teeth and Oropharynx as per pre-operative assessment

## 2022-09-23 NOTE — CV Procedure (Signed)
Transesophageal echocardiogram (TEE) : Preliminary report 09/23/22  Sedation: See anesthesia records.   TEE was performed without complications   LV: Normal EF. RV: Normal structure and function.   LA: Moderate to severe dilatation.  Spontaneous echo contrast was present.  No thrombus present. Left atrial appendage: Spontaneous echo contrast was not present.  No thrombus present. Color Doppler and sonicated saline positive for PFO.  RA: Grossly normal.    MV: mild regurgitation,no stenosis, no vegetation noted.  TV: mild to moderate regurgitation,no stenosis, no vegetation noted. AV: no regurgitation, no stenosis, no vegetation noted.   PV: no regurgitation,no stenosis, no vegetation noted.   Thoracic aorta: Mild plaque.   Final report forthcoming. Family updated.   Rex Kras, [place], [facility]  Pager: [phone] Office: [phone]

## 2022-09-23 NOTE — Anesthesia Postprocedure Evaluation (Signed)
Anesthesia Post Note  Patient: FUSAYE WACHTEL  Procedure(s) Performed: TRANSESOPHAGEAL ECHOCARDIOGRAM (TEE) BUBBLE STUDY     Patient location during evaluation: Endoscopy Anesthesia Type: MAC Level of consciousness: awake Pain management: pain level controlled Vital Signs Assessment: post-procedure vital signs reviewed and stable Respiratory status: spontaneous breathing, nonlabored ventilation, respiratory function stable and patient connected to nasal cannula oxygen Cardiovascular status: stable and blood pressure returned to baseline Postop Assessment: no apparent nausea or vomiting Anesthetic complications: no   No notable events documented.  Last Vitals:  Vitals:   09/23/22 1440 09/23/22 1450  BP: 129/69 (!) 148/72  Pulse: 62 60  Resp: (!) 22 18  Temp:    SpO2: 100% 99%    Last Pain:  Vitals:   09/23/22 1450  TempSrc:   PainSc: 0-No pain                 Glynda Soliday P Ginamarie Banfield

## 2022-09-23 NOTE — Progress Notes (Signed)
  Echocardiogram Echocardiogram Transesophageal has been performed.  Felicia Washington 09/23/2022, 2:53 PM

## 2022-09-23 NOTE — Telephone Encounter (Signed)
Due to tech out I left a voicemail with the patient to call back to r/s her appt since we do not have enough staff on the day she is scheduled on.

## 2022-09-23 NOTE — Discharge Instructions (Addendum)
TEE  YOU HAD AN CARDIAC PROCEDURE TODAY: Refer to the procedure report and other information in the discharge instructions given to you for any specific questions about what was found during the examination. If this information does not answer your questions, please call [place] office at [phone] to clarify.   DIET: Your first meal following the procedure should be a light meal and then it is ok to progress to your normal diet. A half-sandwich or bowl of soup is an example of a good first meal. Heavy or fried foods are harder to digest and may make you feel nauseous or bloated. Drink plenty of fluids but you should avoid alcoholic beverages for 24 hours. If you had a esophageal dilation, please see attached instructions for diet.   ACTIVITY: Your care partner should take you home directly after the procedure. You should plan to take it easy, moving slowly for the rest of the day. You can resume normal activity the day after the procedure however YOU SHOULD NOT DRIVE, use power tools, machinery or perform tasks that involve climbing or major physical exertion for 24 hours (because of the sedation medicines used during the test).   SYMPTOMS TO REPORT IMMEDIATELY: A cardiologist can be reached at any hour. Please call [phone] for any of the following symptoms:  Vomiting of blood or coffee ground material  New, significant abdominal pain  New, significant chest pain or pain under the shoulder blades  Painful or persistently difficult swallowing  New shortness of breath  Black, tarry-looking or red, bloody stools  FOLLOW UP:  Please also call with any specific questions about appointments or follow up tests.

## 2022-09-23 NOTE — Transfer of Care (Signed)
Immediate Anesthesia Transfer of Care Note  Patient: Felicia Washington  Procedure(s) Performed: TRANSESOPHAGEAL ECHOCARDIOGRAM (TEE) BUBBLE STUDY  Patient Location: Endoscopy Unit  Anesthesia Type:MAC  Level of Consciousness: awake, alert  and oriented  Airway & Oxygen Therapy: Patient Spontanous Breathing  Post-op Assessment: Report given to RN and Post -op Vital signs reviewed and stable  Post vital signs: Reviewed and stable  Last Vitals:  Vitals Value Taken Time  BP 132/70 09/23/22 1434  Temp 35.8 C 09/23/22 1434  Pulse 63 09/23/22 1434  Resp 23 09/23/22 1434  SpO2 100 % 09/23/22 1434    Last Pain:  Vitals:   09/23/22 1434  TempSrc: Temporal  PainSc: 0-No pain         Complications: No notable events documented.

## 2022-09-23 NOTE — Interval H&P Note (Signed)
History and Physical Interval Note:  09/23/2022 1:22 PM  Felicia Washington  has presented today for surgery, with the diagnosis of cryptogenic stroke.  The various methods of treatment have been discussed with the patient and family. After consideration of risks, benefits and other options for treatment, the patient has consented to  Procedure(s): TRANSESOPHAGEAL ECHOCARDIOGRAM (TEE) (N/A) as a surgical intervention.  The patient's history has been reviewed, patient examined, no change in status, stable for surgery.  I have reviewed the patient's chart and labs.  Questions were answered to the patient's satisfaction.     After careful review of history and examination, the risks, benefits of transesophageal echocardiogram, and alternatives have been explained to the patient. Complications include but not limited to esophageal perforation (rare), gastrointestinal bleeding (rare), cardiac arrhythmia which can include cardiac arrest and death (rare), pharyngeal irritation / discomfort with swallowing / hematoma, methemoglobinemia, bronchospasm, transient hypoxia, nonsustained ventricular tachycardia, transient atrial fibrillation, minimal hemoptysis, vomiting, hypotension, respiratory compromise, reaction to medications, unavoidable damage to teeth and gums, aspiration pneumonia  were reviewed with the patient.  Patient voices understands, provides verbal feedback, questions answered, and patient wishes to proceed with the procedure.  Rex Kras, [place], [facility]  Pager: [phone] Office: [phone]

## 2022-09-24 NOTE — Telephone Encounter (Signed)
Patient returned my call.  [employer]-no auth req'd(pt chose).  She is r/s for 11/03/22 at 8 pm.  Mailed new packet to th patient.

## 2022-09-25 ENCOUNTER — Encounter (HOSPITAL_COMMUNITY): Payer: Self-pay | Admitting: Cardiology

## 2022-09-27 LAB — ECHO TEE
Area-P 1/2: 4.21 cm2
MV VTI: 2.12 cm2

## 2022-09-30 ENCOUNTER — Encounter: Payer: Self-pay | Admitting: Cardiology

## 2022-10-01 DIAGNOSIS — Z79899 Other long term (current) drug therapy: Secondary | ICD-10-CM | POA: Diagnosis not present

## 2022-10-01 DIAGNOSIS — M1991 Primary osteoarthritis, unspecified site: Secondary | ICD-10-CM | POA: Diagnosis not present

## 2022-10-01 DIAGNOSIS — Z6821 Body mass index (BMI) 21.0-21.9, adult: Secondary | ICD-10-CM | POA: Diagnosis not present

## 2022-10-01 DIAGNOSIS — M1A9XX1 Chronic gout, unspecified, with tophus (tophi): Secondary | ICD-10-CM | POA: Diagnosis not present

## 2022-10-01 DIAGNOSIS — R768 Other specified abnormal immunological findings in serum: Secondary | ICD-10-CM | POA: Diagnosis not present

## 2022-10-12 ENCOUNTER — Encounter: Payer: Self-pay | Admitting: Cardiology

## 2022-10-12 ENCOUNTER — Ambulatory Visit: Payer: Medicare Other | Admitting: Cardiology

## 2022-10-12 VITALS — BP 167/86 | HR 73 | Temp 98.3°F | Resp 16 | Ht 65.0 in | Wt 128.0 lb

## 2022-10-12 DIAGNOSIS — Z95818 Presence of other cardiac implants and grafts: Secondary | ICD-10-CM | POA: Diagnosis not present

## 2022-10-12 DIAGNOSIS — I63133 Cerebral infarction due to embolism of bilateral carotid arteries: Secondary | ICD-10-CM

## 2022-10-12 DIAGNOSIS — I639 Cerebral infarction, unspecified: Secondary | ICD-10-CM | POA: Diagnosis not present

## 2022-10-12 DIAGNOSIS — I10 Essential (primary) hypertension: Secondary | ICD-10-CM | POA: Diagnosis not present

## 2022-10-12 DIAGNOSIS — N289 Disorder of kidney and ureter, unspecified: Secondary | ICD-10-CM

## 2022-10-12 MED ORDER — CHLORTHALIDONE 25 MG PO TABS: 25.0000 mg | ORAL_TABLET | ORAL | 2 refills | Status: DC

## 2022-10-12 MED ORDER — AMLODIPINE BESYLATE 10 MG PO TABS: 10.0000 mg | ORAL_TABLET | Freq: Every morning | ORAL | 3 refills | Status: DC

## 2022-10-12 MED ORDER — POTASSIUM CHLORIDE ER 10 MEQ PO TBCR: 10.0000 meq | EXTENDED_RELEASE_TABLET | ORAL | 3 refills | Status: DC

## 2022-10-12 NOTE — Progress Notes (Addendum)
Primary Physician/Referring:  Reynold Bowen, MD  Patient ID: Felicia Washington, female    DOB: 1946/07/31, 76 y.o.   MRN: [id number]  Chief Complaint  Patient presents with   Cryptogenic stroke    Follow-up    6 week   HPI:    Felicia Washington  is a 76 y.o. 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  She had no significant extracranial vascular anomalies.   She underwent [employer] Lux DX ILR on 02/10/2022. She has no symptoms of palpitations.  She underwent TEE on 09/23/2021 revealing markedly enlarged left atrium, no intracardiac thrombus, however there was a moderate-sized PFO.  Presently doing well and essentially has no specific symptoms or complaints today.  Past Medical History:  Diagnosis Date   Anemia    Arthritis    "fingers, shoulders" (09/01/2018)   Asthma    Celiac disease    GERD (gastroesophageal reflux disease)    Graves disease    Heart murmur    History of blood transfusion    "related to [name]"   History of gout    fingers   HLD (hyperlipidemia)    HTN (hypertension)    Hypothyroidism    Migraine    "1 q couple years" (09/01/2018)   Pneumonia    "several times in 1 yr" (09/01/2018)   PONV (postoperative nausea and vomiting)    Seasonal allergies    Stroke [facility])    jan 2023   Past Surgical History:  Procedure Laterality Date   BUBBLE STUDY  09/23/2022   Procedure: BUBBLE STUDY;  Surgeon: Rex Kras, DO;  Location: [facility];  Service: Cardiovascular;;   [place] Right 09/01/2018   REVERSE SHOULDER  ARTHROPLASTY Right 09/01/2018   Procedure: RIGHT REVERSE SHOULDER ARTHROPLASTY;  Surgeon: Justice Britain, MD;  Location: [facility];  Service: Orthopedics;  Laterality: Right;   SHOULDER ARTHROSCOPY W/ ROTATOR CUFF REPAIR Bilateral    TEE WITHOUT CARDIOVERSION N/A 09/23/2022   Procedure: TRANSESOPHAGEAL ECHOCARDIOGRAM (TEE);  Surgeon: Rex Kras, DO;  Location: [facility];  Service: Cardiovascular;  Laterality: N/A;   THYROIDECTOMY     TONSILLECTOMY     Family History  Problem Relation Age of Onset   Thyroid disease Mother    Gallstones Mother    Multiple myeloma Father    Bone cancer Father    Celiac disease Sister    Colon polyps Sister    Colon polyps Brother    Diabetes Maternal Grandmother    Heart attack Maternal Grandfather    Sleep apnea Daughter     Social History   Tobacco Use   Smoking status: Never   Smokeless tobacco: Never  Substance Use Topics   Alcohol use: No    Alcohol/week: 0.0 standard drinks of alcohol   Marital Status: Widowed  ROS  Review of Systems  Cardiovascular:  Negative for chest pain, dyspnea on exertion and leg swelling.   Objective      10/12/2022    3:49 PM 10/12/2022    3:42 PM 09/23/2022    2:50 PM  Vitals with BMI  Height  5' 5"   Weight  128 lbs  BMI  58.8   Systolic 325 498 264  Diastolic 86 91 72  Pulse 73 67 60   Today's Vitals   10/12/22 1542 10/12/22 1549  BP: (!) 160/91 (!) 167/86  Pulse: 67 73  Resp: 16   Temp: 98.3 F (36.8 C)   TempSrc: Temporal   SpO2: 96%   Weight: 128 lb (58.1 kg)   Height: 5' 5" (1.651 m)     Body mass index is 21.3 kg/m.   Physical Exam Neck:     Vascular: No carotid bruit or JVD.  Cardiovascular:     Rate and Rhythm: Normal rate and regular rhythm. Frequent Extrasystoles are present.    Pulses: Intact distal pulses.     Heart sounds: Normal heart sounds. No murmur heard.    No gallop.  Pulmonary:     Effort: Pulmonary effort is normal.     Breath sounds: Normal breath sounds.   Abdominal:     General: Bowel sounds are normal.     Palpations: Abdomen is soft.  Musculoskeletal:     Right lower leg: No edema.     Left lower leg: No edema.    Medications and allergies   Allergies  Allergen Reactions   Adhesive [Tape] Other (See Comments)    Removes skin   Allopurinol Swelling, Hives and Itching   Codeine Nausea Only   Latex Other (See Comments)    Removes skin     Medication list after today's encounter   Current Outpatient Medications:    aspirin EC 81 MG tablet, Take 81 mg by mouth in the morning. Swallow whole., Disp: , Rfl:    atorvastatin (LIPITOR) 40 MG tablet, Take 40 mg by mouth in the morning., Disp: , Rfl:    buPROPion (WELLBUTRIN XL) 150 MG 24 hr tablet, Take 150 mg by mouth in the morning., Disp: , Rfl:    chlorthalidone (HYGROTON) 25 MG tablet, Take 1 tablet (25 mg total) by mouth every morning., Disp: 30 tablet, Rfl: 2   clonazePAM (KLONOPIN) 0.5 MG tablet, Take 0.5 mg by mouth daily as needed for anxiety., Disp: , Rfl:    famotidine (PEPCID) 20 MG tablet, Take 20 mg by mouth every evening., Disp: , Rfl:    Febuxostat 80 MG TABS, Take 80 mg by mouth every evening., Disp: , Rfl:    fenofibrate micronized (LOFIBRA) 67 MG capsule, Take 67 mg by mouth in the morning., Disp: , Rfl: 5   levothyroxine (SYNTHROID, LEVOTHROID) 112 MCG tablet, Take 56-224 mcg by mouth See admin instructions. Take 2 tablets (224 mcg) by mouth on Mondays through Saturdays before breakfast. Take 0.5 tablet (56 mcg) by mouth on Sundays before breakfast., Disp: , Rfl:    metoprolol succinate (TOPROL XL) 50 MG 24 hr tablet, Take 1 tablet (50 mg total) by mouth at bedtime., Disp: 90 tablet, Rfl: 1   montelukast (SINGULAIR) 10 MG tablet, Take 10 mg by mouth at bedtime., Disp: , Rfl:    olmesartan (BENICAR) 40 MG tablet, Take 1 tablet (40 mg total) by mouth every evening., Disp: , Rfl:    potassium chloride (KLOR-CON) 10 MEQ tablet, Take 1 tablet (10 mEq total) by mouth every  morning., Disp: 90 tablet, Rfl: 3   valACYclovir (VALTREX) 500 MG tablet, Take 500 mg by mouth daily as needed (Fever Blisters)., Disp: , Rfl:    amLODipine (NORVASC) 10 MG tablet, Take 1 tablet (10 mg total) by mouth every morning., Disp: 90 tablet, Rfl: 3  Laboratory examination:  Lab Results  Component Value Date   NA 139 01/03/2022   K 3.6 01/03/2022   CO2 26 01/03/2022   GLUCOSE 101 (H) 01/03/2022   BUN 12 01/03/2022   CREATININE 0.75 01/03/2022   CALCIUM 9.5 01/03/2022   GFRNONAA >60 01/03/2022       Latest Ref Rng & Units 01/03/2022    1:55 AM 01/02/2022   10:16 AM 01/01/2022    9:42 AM  BMP  Glucose 70 - 99 mg/dL 101  124  107   BUN 8 - 23 mg/dL _0 Creatinine 0.44 - 1.00 mg/dL 0.75  0.68  0.82   Sodium 135 - 145 mmol/L 139  138  138   Potassium 3.5 - 5.1 mmol/L 3.6  3.3  4.0   Chloride 98 - 111 mmol/L 107  108  99   CO2 22 - 32 mmol/L _1 Calcium 8.9 - 10.3 mg/dL 9.5  8.6  10.4       Latest Ref Rng & Units 01/02/2022   10:16 AM 01/01/2022    9:42 AM 01/20/2016    3:03 PM  Hepatic Function  Total Protein 6.5 - 8.1 g/dL 5.3  7.2  7.4   Albumin 3.5 - 5.0 g/dL 3.1  4.4  4.5   AST 15 - 41 U/L _2 ALT 0 - 44 U/L _3 Alk Phosphatase 38 - 126 U/L 42  61  41   Total Bilirubin 0.3 - 1.2 mg/dL 0.7  0.9  0.5     Lipid Panel Recent Labs    01/02/22 0500  CHOL 165  TRIG 90  LDLCALC 71  VLDL 18  HDL 76  CHOLHDL 2.2    HEMOGLOBIN A1C Lab Results  Component Value Date   HGBA1C 4.9 01/02/2022   MPG 93.93 01/02/2022   TSH TSH 1.2023: 0.02 (abnormal space 0.40-4.20).  Free T4  3.4 (elevated space 0.8-1.8 NG per mL)  External labs:  Labs 10/02/2022:  Serum glucose 88 mg, BUN 16, creatinine 0.87, EGFR 70 mL, potassium 3.9, AST 50, ALT 58 mildly elevated.  Hb 12.9/HCT 39.6, platelets 191, normal indicis.  Uric acid low at 1.6. Radiology:   MRI Brain 01/01/2022: Punctate acute cortical infarct within the left parietal lobe Two  additional punctate foci of diffusion-weighted hyperintensity, one within the medial left frontal lobe (left ACA vascular territory), and the other within the right precentral gyrus (right MCA vascular territory).     Cardiac Studies:   Echocardiogram 01/02/2022:   1. Left ventricular ejection fraction, by estimation, is 60 to 65%. The  left ventricle has normal function. The left ventricle has no regional  wall motion abnormalities. Left ventricular diastolic parameters are consistent with Grade I diastolic dysfunction (impaired relaxation). Elevated left ventricular end-diastolic pressure.   2. Right ventricular systolic function is normal. The right ventricular size is normal. There is mildly elevated pulmonary artery systolic pressure.   3. Left atrial size was severely dilated.   4. The mitral valve is normal in structure. Trivial mitral valve regurgitation. No evidence of mitral stenosis.   5. The aortic valve is tricuspid. Aortic valve regurgitation is not visualized. No aortic stenosis is present.   6. The inferior vena cava is normal in size with greater than 50% respiratory variability, suggesting right atrial pressure of 3 mmHg.   TEE 09/23/2022:  1. Left ventricular ejection fraction, by estimation, is  60 to 65%. The left ventricle has normal function. The left ventricle has no regional wall motion abnormalities. Left ventricular diastolic function could not be evaluated. Elevated left atrial pressure.  2. Left atrial size was visually appears moderate to severe dilatation. No left atrial/left atrial appendage thrombus was detected. The LAA emptying velocity was 17 cm/s.  Evidence of atrial level shunting detected by color flow Doppler. Agitated saline contrast bubble study was positive with shunting observed within 3-6 cardiac cycles suggestive of interatrial shunt. There is a patent foramen ovale with predominantly  right to left shunting across the atrial septum. 3.  Mild MR and  mild to moderate TR. 4. Aorta: The aortic root and ascending aorta are structurally normal, with no evidence of dilitation. There is minimal (Grade I) layered plaque  involving the descending aorta and aortic arch.  Comparison(s): Prior study 01/02/2022: LVEF 60-65%, G1DD, elevated LVEDP, severe LAE, trivial MR, estimated RAP 75mHG.  [employer] LUX Loop   Remote loop recorder transmission 09/14/2022: Predominant rhythm is normal sinus rhythm. Atrial fibrillation = PACs.  EKG:   EKG 09/04/2022: Normal sinus rhythm at rate of 70 bpm, normal axis, no evidence of ischemia, normal EKG.    Assessment     ICD-10-CM   1. 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 [employer] LUX Loop  Z95.818     3. Primary hypertension  I10 amLODipine (NORVASC) 10 MG tablet    olmesartan (BENICAR) 40 MG tablet    chlorthalidone (HYGROTON) 25 MG tablet    potassium chloride (KLOR-CON) 10 MEQ tablet    Basic metabolic panel    Basic metabolic panel       Orders Placed This Encounter  Procedures   Basic metabolic panel    Standing Status:   Future    Number of Occurrences:   1    Standing Expiration Date:   10/13/2023    Meds ordered this encounter  Medications   amLODipine (NORVASC) 10 MG tablet    Sig: Take 1 tablet (10 mg total) by mouth every morning.    Dispense:  90 tablet    Refill:  3   chlorthalidone (HYGROTON) 25 MG tablet    Sig: Take 1 tablet (25 mg total) by mouth every morning.    Dispense:  30 tablet    Refill:  2   potassium chloride (KLOR-CON) 10 MEQ tablet    Sig: Take 1 tablet (10 mEq total) by mouth every morning.    Dispense:  90 tablet    Refill:  3    Medications Discontinued During This Encounter  Medication Reason   amoxicillin (AMOXIL) 500 MG capsule    olmesartan (BENICAR) 40 MG tablet    metoprolol succinate (TOPROL-XL) 25 MG 24 hr tablet    olmesartan (BENICAR) 40 MG  tablet Dose change   amLODipine (NORVASC) 5 MG tablet Reorder      Recommendations:   Felicia BARLETTAis a 76y.o.  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  She had no significant extracranial vascular anomalies.   She underwent [employer] DX ILR on 02/10/2022. She has no symptoms of palpitations.  She underwent TEE  on 09/23/2021 revealing markedly enlarged left atrium, no intracardiac thrombus, however there was a moderate-sized PFO.  Although my suspicion for paradoxical embolus was present, in view of severely enlarged left atrium, atrial fibrillation is also high on the likelihood.  For now I have recommended we continue observation and continue aspirin indefinitely.    We will aim at targeting her hypertension and risk factors.  Lipids are well controlled but her blood pressure is not.  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  We will increase her amlodipine to 10 mg daily, reduce olmesartan from 40 mg twice daily to 40 mg in the evening, continue metoprolol succinate 50 mg daily and I have added chlorthalidone along with potassium supplement daily in the morning.  She has history of gout, we will have to keep a look out for this.  Will obtain BMP in 2 to 3 weeks.  If blood pressure is not controlled with the above regimen or if I see worsening renal function, will pursue renal artery stenosis work-up.    I would like to see her back in 4 weeks for follow-up.  I have reviewed her loop recorder as well.  She has not had any atrial  fibrillation.  Because of chronic fatigue, hypertension, she has been referred for sleep evaluation and this is pending soon.  I spent a total of 40 minutes in reviewing the TEE, images of the TEE, discussion regarding atrial fibrillation, discussions regarding anticoagulation versus antiplatelet therapy with the patient and her daughter.  Addenddum    ICD-10-CM   1. 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 [employer] LUX Loop  Z95.818     3. Primary hypertension  I10 amLODipine (NORVASC) 10 MG tablet    olmesartan (BENICAR) 40 MG tablet    chlorthalidone (HYGROTON) 25 MG tablet    potassium chloride (KLOR-CON) 10 MEQ tablet    Basic metabolic panel    Basic metabolic panel    Basic metabolic panel    Urinalysis, Routine w reflex microscopic    4. Acute renal insufficiency  E45.4 Basic metabolic panel    Urinalysis, Routine w reflex microscopic       Felicia Prows, MD, [facility] 10/12/2022, 10:01 PM Office: [phone]

## 2022-10-15 ENCOUNTER — Encounter: Payer: Self-pay | Admitting: Cardiology

## 2022-10-16 ENCOUNTER — Ambulatory Visit: Payer: Medicare Other | Admitting: Cardiology

## 2022-10-16 DIAGNOSIS — Z4509 Encounter for adjustment and management of other cardiac device: Secondary | ICD-10-CM | POA: Diagnosis not present

## 2022-10-16 DIAGNOSIS — Z95818 Presence of other cardiac implants and grafts: Secondary | ICD-10-CM | POA: Diagnosis not present

## 2022-10-16 DIAGNOSIS — I639 Cerebral infarction, unspecified: Secondary | ICD-10-CM | POA: Diagnosis not present

## 2022-10-17 DIAGNOSIS — Z23 Encounter for immunization: Secondary | ICD-10-CM | POA: Diagnosis not present

## 2022-10-26 DIAGNOSIS — I10 Essential (primary) hypertension: Secondary | ICD-10-CM | POA: Diagnosis not present

## 2022-10-27 ENCOUNTER — Telehealth: Payer: Self-pay | Admitting: Cardiology

## 2022-10-27 ENCOUNTER — Telehealth: Payer: Self-pay

## 2022-10-27 LAB — BASIC METABOLIC PANEL
BUN/Creatinine Ratio: 31 — ABNORMAL HIGH (ref 12–28)
BUN: 40 mg/dL — ABNORMAL HIGH (ref 8–27)
CO2: 23 mmol/L (ref 20–29)
Calcium: 10.1 mg/dL (ref 8.7–10.3)
Chloride: 99 mmol/L (ref 96–106)
Creatinine, Ser: 1.3 mg/dL — ABNORMAL HIGH (ref 0.57–1.00)
Glucose: 97 mg/dL (ref 70–99)
Potassium: 4.6 mmol/L (ref 3.5–5.2)
Sodium: 136 mmol/L (ref 134–144)
eGFR: 43 mL/min/{1.73_m2} — ABNORMAL LOW (ref >59)

## 2022-10-27 NOTE — Telephone Encounter (Signed)
Pt called earlier asking to speak with you, she was checking in again.

## 2022-10-27 NOTE — Telephone Encounter (Signed)
I just sent her a message this evening and also a note to one of you. She needs to have BMP done 1 day before her next OV with me

## 2022-10-27 NOTE — Telephone Encounter (Signed)
I have addressed her question in another message to provider.

## 2022-10-27 NOTE — Addendum Note (Signed)
Addended by: Kela Millin on: 10/27/2022 04:36 PM   Modules accepted: Orders

## 2022-10-27 NOTE — Telephone Encounter (Signed)
Patient called and stated that she was looking at her recent lab results and noticed that her numbers are "a lot higher then the previous blood work." Please advise.

## 2022-10-28 NOTE — Telephone Encounter (Signed)
Called patient, NA, LMAM

## 2022-11-02 DIAGNOSIS — I499 Cardiac arrhythmia, unspecified: Secondary | ICD-10-CM | POA: Diagnosis not present

## 2022-11-02 DIAGNOSIS — N1831 Chronic kidney disease, stage 3a: Secondary | ICD-10-CM | POA: Diagnosis not present

## 2022-11-02 DIAGNOSIS — E785 Hyperlipidemia, unspecified: Secondary | ICD-10-CM | POA: Diagnosis not present

## 2022-11-02 DIAGNOSIS — I129 Hypertensive chronic kidney disease with stage 1 through stage 4 chronic kidney disease, or unspecified chronic kidney disease: Secondary | ICD-10-CM | POA: Diagnosis not present

## 2022-11-02 DIAGNOSIS — K219 Gastro-esophageal reflux disease without esophagitis: Secondary | ICD-10-CM | POA: Diagnosis not present

## 2022-11-02 DIAGNOSIS — R945 Abnormal results of liver function studies: Secondary | ICD-10-CM | POA: Diagnosis not present

## 2022-11-02 DIAGNOSIS — I10 Essential (primary) hypertension: Secondary | ICD-10-CM | POA: Diagnosis not present

## 2022-11-02 DIAGNOSIS — I669 Occlusion and stenosis of unspecified cerebral artery: Secondary | ICD-10-CM | POA: Diagnosis not present

## 2022-11-02 DIAGNOSIS — E890 Postprocedural hypothyroidism: Secondary | ICD-10-CM | POA: Diagnosis not present

## 2022-11-02 DIAGNOSIS — G319 Degenerative disease of nervous system, unspecified: Secondary | ICD-10-CM | POA: Diagnosis not present

## 2022-11-03 ENCOUNTER — Ambulatory Visit (INDEPENDENT_AMBULATORY_CARE_PROVIDER_SITE_OTHER): Payer: Medicare Other | Admitting: Neurology

## 2022-11-03 DIAGNOSIS — E663 Overweight: Secondary | ICD-10-CM

## 2022-11-03 DIAGNOSIS — G4733 Obstructive sleep apnea (adult) (pediatric): Secondary | ICD-10-CM

## 2022-11-03 DIAGNOSIS — G4720 Circadian rhythm sleep disorder, unspecified type: Secondary | ICD-10-CM

## 2022-11-03 DIAGNOSIS — R351 Nocturia: Secondary | ICD-10-CM

## 2022-11-03 DIAGNOSIS — I639 Cerebral infarction, unspecified: Secondary | ICD-10-CM

## 2022-11-03 DIAGNOSIS — Z820 Family history of epilepsy and other diseases of the nervous system: Secondary | ICD-10-CM

## 2022-11-03 DIAGNOSIS — R9431 Abnormal electrocardiogram [ECG] [EKG]: Secondary | ICD-10-CM

## 2022-11-03 DIAGNOSIS — G4700 Insomnia, unspecified: Secondary | ICD-10-CM

## 2022-11-05 NOTE — Procedures (Signed)
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 Her Epworth sleepiness score is 0 out of 24, fatigue severity score is 12 out of 63.  Height: 60 in Weight: 134 lb (BMI 26) Neck Size: 14 in. The patient took clonazepam prior to study start.  MEDICATIONS: Norvasc, Aspirin, Lipitor, Klonopin, Febuxostat, Lofibra, Synthroid, Toprol - XL, Singulair, Benicar, Baltrex TECHNICAL DESCRIPTION: A registered sleep technologist was in attendance for the duration of the recording.  Data collection, scoring, video monitoring, and reporting were performed in compliance with the AASM Manual for the Scoring of Sleep and Associated Events; (Hypopnea is scored based on the criteria listed in Section VIII D. 1b in the AASM Manual V2.6 using a 4% oxygen desaturation rule or Hypopnea is scored based on the criteria listed in Section VIII D. 1a in the AASM Manual V2.6 using 3% oxygen desaturation and /or arousal rule).   SLEEP CONTINUITY AND SLEEP ARCHITECTURE:  Lights-out was at 20:49: and lights-on at  04:50:, with total recording time of 8 hours, 1 min. Total sleep time ( TST) was 375.5 minutes with a decreased sleep efficiency at 78.1%.  BODY POSITION:  TST was divided  between the following sleep  positions: 100.0% supine;  0.0% lateral;  0% prone. Duration of total sleep and percent of total sleep in their respective position is as follows: supine 375 minutes (100%), non-supine 0 minutes (0%); right 00 minutes (0%), left 00 minutes (0%), and prone 00 minutes (0%).  Total supine REM sleep time was 04 minutes (100% of total REM sleep). Sleep latency was  26.5 minutes.  REM sleep latency was increased at 342.5 minutes. Of the total sleep time, the percentage of stage N1 sleep was 1.3%, stage N2 sleep was 75%, which is markedly increased, stage N3 sleep was 22.6%, and REM sleep was nearly absent, at 1.2%. Wake after sleep onset (WASO) time accounted for 79 minutes with mild sleep fragmentation noted.  RESPIRATORY MONITORING:  Based on CMS criteria (using a 4% oxygen desaturation rule for scoring hypopneas), there were 0 apneas (0 obstructive; 0 central; 0 mixed), and 0 hypopneas.  Apnea index was 0.0. Hypopnea index was 0.0. The apnea-hypopnea index was 0.0/hour overall (0.0 supine, 0 non-supine; 0.0 REM, 0.0 supine REM).  There were 0 respiratory effort-related arousals (RERAs).  The RERA index was 0 events/h. Total respiratory disturbance index (RDI) was 0.0 events/h. RDI results showed: supine RDI  0.0 /h; non-supine RDI 0.0 /h; REM RDI 0.0 /h, supine REM RDI 0.0 /h.   Based on AASM criteria (using a 3% oxygen desaturation and /or arousal rule for scoring hypopneas), there were 0 apneas (0 obstructive; 0 central; 0 mixed), and 0 hypopneas. Apnea index was 0.0. Hypopnea index was 0.0. The apnea-hypopnea index was 0.0 overall (0.0 supine, 0 non-supine; 0.0 REM, 0.0 supine REM).  There  were 0 respiratory effort-related arousals (RERAs).  The RERA index was 0 events/h. Total respiratory disturbance index (RDI) was 0.0 events/h. RDI results showed: supine RDI  0.0 /h; non-supine RDI 0.0 /h; REM RDI 0.0 /h, supine REM RDI 0.0 /h.  OXIMETRY: Oxyhemoglobin Saturation Nadir during sleep was at  94%) from a  mean of 97%.  Of the Total sleep time (TST)   hypoxemia (=<88%) was present for  0.0 minutes, or 0.0% of total sleep time.  LIMB MOVEMENTS: There were 0 periodic limb movements of sleep (0.0/hr), of which 0 (0.0/hr) were associated with an arousal. AROUSAL: There were 19 arousals in total, for an arousal index of 3 arousals/hour.  Of these, 0 were identified as respiratory-related arousals (0 /h), 0 were PLM-related arousals (0 /h), and 25 were non-specific arousals (4 /h). EEG: Review of the EEG showed no abnormal electrical discharges and symmetrical bihemispheric findings.   EKG: The EKG revealed normal sinus rhythm (NSR). The average heart rate during sleep was 56 bpm. AUDIO/VIDEO REVIEW: The audio and video review did not show any abnormal or unusual behaviors, movements, phonations or vocalizations. The patient took 1 restroom break.  No audible snoring was detected.   POST-STUDY QUESTIONNAIRE: Post study, the patient indicated, that sleep was the same as usual. The patient stated in the comment section of the questionnaire: "Very thorough technician.  Good environment." IMPRESSION:  1. Dysfunctions associated with sleep stages or arousal from sleep 2. Non-specific abnormal electrocardiogram (EKG) RECOMMENDATIONS:  1. This study does not demonstrate any significant obstructive or central sleep disordered breathing with an AHI of less than 5/hour - her total AHI was 0/h, O2 nadir was 94%.  No significant snoring was detected during the study. There was very little REM sleep which may have led to an underestimation of her sleep disordered breathing.   2. This study shows some sleep fragmentation and abnormal sleep stage percentages; these are nonspecific findings and per se do not signify an intrinsic sleep disorder or a cause for the patient's sleep-related symptoms. Causes include (but are not limited to) the first night effect of the sleep study, circadian rhythm disturbances, medication effect or  an underlying mood disorder or medical problem.  3. The study showed occasional PVCs on single lead EKG; clinical correlation is recommended. The patient is followed by cardiology; she has a loop recorder in place. 4. The patient should be cautioned not to drive, work at heights, or operate dangerous or heavy equipment when tired or sleepy. Review and reiteration of good sleep hygiene measures should be pursued with any patient. 5. The patient will be advised to follow up with the referring provider, who will be notified of the test results. I certify that I have reviewed the entire raw data recording prior to the issuance of this report in accordance with the Standards of Accreditation of the American Academy of Sleep Medicine (AASM). Star Age, MD, PhD [facility]) 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 She is not sure if she snores, no one has complained about it.  Norvasc, Aspirin, Lipitor, Klonopin, Febuxostat, Lofibra, Synthroid, Toprol - XL, Singulair, Benicar, Baltrex   Sleep Disorder      Comments   The patient came into the sleep lab for a PSG. The patient took Klonopine prior to start of study. She had one restroom trip. EKG  showed occasional PVCs. No snoring noted. No respiratory events. Slept supine only. Short REM period.    Lights out: 08:49:14 PM Lights on: 04:50:19 AM   Time Total Supine Side Prone Upright  Recording (TRT) 8h 1.61m8h 1.048mh 0.41m441m 0.41m 41m0.41m  82mep (TST) 6h 15.62m 6h58m.62m 0h 69mm 0h 09m 0h 0.562m  Late13m N1 N2 N3 REM Onset Per. Slp. Eff.  Actual 0h 0.41m 0h 2.41m34m 13.62m141m 42.62m 27m26.62m 0441m6.62m 78941m%   Stg41mr Wake N1 N2 N3 REM  Total 105.5 5.0 281.0 85.0 4.5  Supine 105.5 5.0 281.0 85.0 4.5  Side 0.0 0.0 0.0 0.0 0.0  Prone 0.0 0.0 0.0 0.0 0.0  Upright 0.0 0.0 0.0 0.0 0.0   Stg % Wake N1 N2 N3 REM  Total 21.9 1.3 74.8 22.6 1.2  Supine 21.9 1.3 74.8 22.6 1.2  Side 0.0 0.0 0.0 0.0 0.0  Prone 0.0 0.0 0.0 0.0 0.0  Upright 0.0 0.0 0.0 0.0 0.0     Apnea Summary Sub Supine Side Prone Upright  Total 0 Total 0 0 0 0 0    REM 0 0 0 0 0    NREM 0 0 0 0 0  Obs 0 REM 0 0 0 0 0    NREM 0 0 0 0 0  Mix 0 REM 0 0 0 0 0    NREM 0 0 0 0 0  Cen 0 REM 0 0 0 0 0    NREM 0 0 0 0 0   Rera Summary Sub Supine Side Prone Upright  Total 0 Total 0 0 0 0 0    REM 0 0 0 0 0    NREM 0 0 0 0 0   Hypopnea Summary Sub Supine Side Prone Upright  Total 0 Total 0 0 0 0 0    REM 0 0 0 0 0    NREM 0 0 0 0 0   4% Hypopnea Summary Sub Supine Side Prone Upright  Total (4%) 0 Total 0 0 0 0 0    REM 0 0 0 0 0    NREM 0 0 0 0 0     AHI Total Obs Mix Cen  0.00 Apnea 0.00 0.00 0.00 0.00   Hypopnea 0.00 -- -- --  0.00 Hypopnea (4%) 0.00 -- -- --    Total Supine Side Prone Upright  Position AHI 0.00 0.00 0.00 0.00 0.00  REM AHI 0.00   NREM AHI 0.00   Position RDI 0.00 0.00 0.00 0.00 0.00  REM RDI 0.00   NREM RDI 0.00    4% Hypopnea Total Supine Side Prone Upright  Position AHI (4%) 0.00 0.00 0.00 0.00 0.00  REM AHI (4%) 0.00   NREM AHI (4%) 0.00   Position RDI (4%) 0.00 0.00 0.00 0.00 0.00  REM RDI (4%) 0.00   NREM RDI (4%) 0.00    Desaturation Information Threshold: 2% <  100% <90%  <80% <70% <60% <50% <40%  Supine 45.0 0.0 0.0 0.0 0.0 0.0 0.0  Side 0.0 0.0 0.0 0.0 0.0 0.0 0.0  Prone 0.0 0.0 0.0 0.0 0.0 0.0 0.0  Upright 0.0 0.0 0.0 0.0 0.0 0.0 0.0  Total 45.0 0.0 0.0 0.0 0.0 0.0 0.0  Index 6.6 0.0 0.0 0.0 0.0 0.0 0.0   Threshold: 3% <100% <90% <80% <70% <60% <50% <40%  Supine 6.0 0.0 0.0 0.0 0.0 0.0 0.0  Side 0.0 0.0 0.0 0.0 0.0 0.0 0.0  Prone 0.0 0.0 0.0 0.0 0.0 0.0 0.0  Upright 0.0 0.0 0.0 0.0 0.0 0.0 0.0  Total 6.0 0.0 0.0 0.0 0.0 0.0 0.0  Index 0.9 0.0 0.0 0.0 0.0 0.0 0.0   Threshold: 4% <100% <90% <80% <70% <60% <50% <40%  Supine 1.0 0.0 0.0 0.0 0.0 0.0 0.0  Side 0.0 0.0 0.0 0.0 0.0 0.0 0.0  Prone 0.0 0.0 0.0 0.0 0.0 0.0 0.0  Upright 0.0 0.0 0.0 0.0 0.0 0.0 0.0  Total 1.0 0.0 0.0 0.0 0.0 0.0 0.0  Index 0.1 0.0 0.0 0.0 0.0 0.0 0.0   Threshold: 3% <100% <90% <80% <70% <60% <50% <40%  Supine 6 0 0 0 0 0 0  Side 0 0 0 0 0 0 0  Prone 0 0 0 0 0 0 0  Upright 0 0 0 0 0 0 0  Total 6 0 0 0 0 0 0   Awakening/Arousal Information # of Awakenings 9  Wake after sleep onset 79.53m Wake after persistent sleep 79.040m Arousal Assoc. Arousals Index  Apneas 0 0.0  Hypopneas 0 0.0  Leg Movements 0 0.0  Snore 0 0.0  PTT Arousals 0 0.0  Spontaneous 26 4.2  Total 26 4.2  Leg Movement Information PLMS LMs Index  Total LMs during PLMS 0 0.0  LMs w/ Microarousals 0 0.0   LM LMs Index  w/ Microarousal 0 0.0  w/ Awakening 0 0.0  w/ Resp Event 0 0.0  Spontaneous 3 0.5  Total 3 0.5     Desaturation threshold setting: 3% Minimum desaturation setting: 10 seconds SaO2 nadir: 94% The longest event was a 0 sec N/A Apnea with a minimum SaO2 of --%. The lowest SaO2 was --% associated with a 00 sec N/A Apnea. EKG Rates EKG Avg Max Min  Awake 60 104 49  Asleep 56 69 51  EKG Events: N/A

## 2022-11-09 ENCOUNTER — Telehealth: Payer: Self-pay | Admitting: *Deleted

## 2022-11-09 DIAGNOSIS — I10 Essential (primary) hypertension: Secondary | ICD-10-CM | POA: Diagnosis not present

## 2022-11-09 DIAGNOSIS — N289 Disorder of kidney and ureter, unspecified: Secondary | ICD-10-CM | POA: Diagnosis not present

## 2022-11-09 NOTE — Telephone Encounter (Signed)
-----   Message from Star Age, MD sent at 11/05/2022  6:35 PM EST ----- Patient referred by Frann Rider, NP, seen by me on 09/07/22, diagnostic PSG on 11/03/2022.   Please call and notify the patient that the recent sleep study did not show any significant obstructive sleep apnea.  She had some sleep disruption, achieved very little dream sleep but this could be a medication effect from taking clonazepam.  Treatment with a CPAP or similar machine is not necessary.  At this juncture, she can follow-up with her primary care and other providers including Dr. Leonie Man and Janett Billow as scheduled.    Thanks,  Star Age, MD, PhD [facility])

## 2022-11-09 NOTE — Telephone Encounter (Signed)
I called pt and relayed the results of her sleep test per Dr. Rexene Alberts.   Recent sleep study did not show any significant obstructive sleep apnea.  She had some sleep disruption, achieved very little dream sleep but this could be a medication effect from taking clonazepam.  Treatment with a CPAP or similar machine is not necessary.  At this juncture, she can follow-up with her primary care and other providers including Dr. Leonie Man and Janett Billow as scheduled. She verbalized understanding.

## 2022-11-09 NOTE — Telephone Encounter (Signed)
Thank you for the positive feedback, we will pass this along to [facility].

## 2022-11-10 ENCOUNTER — Encounter: Payer: Self-pay | Admitting: Cardiology

## 2022-11-10 ENCOUNTER — Ambulatory Visit: Payer: Medicare Other | Admitting: Cardiology

## 2022-11-10 VITALS — BP 130/77 | HR 69 | Resp 14 | Ht 65.0 in | Wt 127.0 lb

## 2022-11-10 DIAGNOSIS — I63133 Cerebral infarction due to embolism of bilateral carotid arteries: Secondary | ICD-10-CM

## 2022-11-10 DIAGNOSIS — Z95818 Presence of other cardiac implants and grafts: Secondary | ICD-10-CM | POA: Diagnosis not present

## 2022-11-10 DIAGNOSIS — I639 Cerebral infarction, unspecified: Secondary | ICD-10-CM | POA: Diagnosis not present

## 2022-11-10 DIAGNOSIS — I10 Essential (primary) hypertension: Secondary | ICD-10-CM | POA: Diagnosis not present

## 2022-11-10 LAB — URINALYSIS, ROUTINE W REFLEX MICROSCOPIC
Bilirubin, UA: NEGATIVE
Glucose, UA: NEGATIVE
Ketones, UA: NEGATIVE
Leukocytes,UA: NEGATIVE
Nitrite, UA: NEGATIVE
Protein,UA: NEGATIVE
RBC, UA: NEGATIVE
Specific Gravity, UA: 1.011 (ref 1.005–1.030)
Urobilinogen, Ur: 0.2 mg/dL (ref 0.2–1.0)
pH, UA: 5.5 (ref 5.0–7.5)

## 2022-11-10 LAB — BASIC METABOLIC PANEL
BUN/Creatinine Ratio: 34 — ABNORMAL HIGH (ref 12–28)
BUN: 40 mg/dL — ABNORMAL HIGH (ref 8–27)
CO2: 22 mmol/L (ref 20–29)
Calcium: 10.3 mg/dL (ref 8.7–10.3)
Chloride: 97 mmol/L (ref 96–106)
Creatinine, Ser: 1.19 mg/dL — ABNORMAL HIGH (ref 0.57–1.00)
Glucose: 92 mg/dL (ref 70–99)
Potassium: 4.6 mmol/L (ref 3.5–5.2)
Sodium: 133 mmol/L — ABNORMAL LOW (ref 134–144)
eGFR: 48 mL/min/{1.73_m2} — ABNORMAL LOW (ref >59)

## 2022-11-10 MED ORDER — OLMESARTAN MEDOXOMIL 40 MG PO TABS: 40.0000 mg | ORAL_TABLET | Freq: Every evening | ORAL | 3 refills | Status: DC

## 2022-11-10 MED ORDER — CHLORTHALIDONE 25 MG PO TABS: 25.0000 mg | ORAL_TABLET | ORAL | 3 refills | Status: DC

## 2022-11-10 MED ORDER — AMLODIPINE BESYLATE 10 MG PO TABS: 10.0000 mg | ORAL_TABLET | Freq: Every morning | ORAL | 3 refills | Status: DC

## 2022-11-10 MED ORDER — POTASSIUM CHLORIDE ER 10 MEQ PO TBCR: 10.0000 meq | EXTENDED_RELEASE_TABLET | ORAL | 3 refills | Status: DC

## 2022-11-10 MED ORDER — METOPROLOL SUCCINATE ER 50 MG PO TB24: 50.0000 mg | ORAL_TABLET | Freq: Every day | ORAL | 3 refills | Status: DC

## 2022-11-10 NOTE — Progress Notes (Signed)
Primary Physician/Referring:  Reynold Bowen, MD  Patient ID: Felicia Washington, female    DOB: April 16, 1946, 76 y.o.   MRN: [id number]  Chief Complaint  Patient presents with   Hypertension   Follow-up    4 weeks   HPI:    Felicia Washington  is a 76 y.o. 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    She underwent [employer] Lux DX ILR on 02/10/2022. She has no symptoms of palpitations.  She underwent TEE on 09/23/2021 revealing markedly enlarged left atrium, no intracardiac thrombus, however there was a moderate-sized PFO.  This is a 6-week office visit for follow-up of hypertension.  She is now tolerating all medications well.  Underwent labs and presents for follow-up.  No specific complaints today.  Past Medical History:  Diagnosis Date   Anemia    Arthritis    "fingers, shoulders" (09/01/2018)   Asthma    Celiac disease    GERD (gastroesophageal reflux disease)    Graves disease    Heart murmur    History of blood transfusion    "related to [name]"   History of gout    fingers   HLD (hyperlipidemia)    HTN (hypertension)    Hypothyroidism    Migraine    "1 q couple years" (09/01/2018)   Pneumonia    "several times in 1 yr" (09/01/2018)   PONV (postoperative nausea and vomiting)    Seasonal allergies    Stroke [facility])    jan 2023   Past Surgical History:  Procedure Laterality Date   BUBBLE STUDY  09/23/2022   Procedure: BUBBLE STUDY;  Surgeon: Rex Kras, DO;  Location: [place];  Service: Cardiovascular;;   [place]  Right 09/01/2018   REVERSE SHOULDER ARTHROPLASTY Right 09/01/2018   Procedure: RIGHT REVERSE SHOULDER ARTHROPLASTY;  Surgeon: Justice Britain, MD;  Location: [place];  Service: Orthopedics;  Laterality: Right;   SHOULDER ARTHROSCOPY W/ ROTATOR CUFF REPAIR Bilateral    TEE WITHOUT CARDIOVERSION N/A 09/23/2022   Procedure: TRANSESOPHAGEAL ECHOCARDIOGRAM (TEE);  Surgeon: Rex Kras, DO;  Location: [facility];  Service: Cardiovascular;  Laterality: N/A;   THYROIDECTOMY     TONSILLECTOMY     Family History  Problem Relation Age of Onset   Thyroid disease Mother    Gallstones Mother    Multiple myeloma Father    Bone cancer Father    Celiac disease Sister    Colon polyps Sister    Colon polyps Brother    Diabetes Maternal Grandmother    Heart attack Maternal Grandfather    Sleep apnea Daughter     Social History   Tobacco Use   Smoking status: Never   Smokeless tobacco: Never  Substance Use Topics   Alcohol use: No    Alcohol/week: 0.0 standard drinks of alcohol   Marital Status: Widowed  ROS  Review of Systems  Cardiovascular:  Negative for chest pain, dyspnea on exertion and leg swelling.   Objective      11/10/2022   10:16 AM 10/12/2022    3:49 PM 10/12/2022    3:42 PM  Vitals with BMI  Height _0   _0   Weight 127 lbs  128 lbs  BMI 04.54  09.8  Systolic 119 147 829  Diastolic 77 86 91  Pulse 69 73 67   Today's Vitals   11/10/22 1016  BP: 130/77  Pulse: 69  Resp: 14  Weight: 127 lb (57.6 kg)  Height: _1  (1.651 m)   Body mass index is 21.13 kg/m.   Physical Exam Neck:     Vascular: No carotid bruit or JVD.  Cardiovascular:     Rate and Rhythm: Normal rate and regular rhythm. Frequent Extrasystoles are present.    Pulses: Intact distal pulses.     Heart sounds: Normal heart sounds. No murmur heard.    No gallop.  Pulmonary:     Effort: Pulmonary effort is normal.     Breath sounds: Normal breath sounds.  Abdominal:     General: Bowel sounds are  normal.     Palpations: Abdomen is soft.  Musculoskeletal:     Right lower leg: No edema.     Left lower leg: No edema.    Medications and allergies   Allergies  Allergen Reactions   Adhesive [Tape] Other (See Comments)    Removes skin   Allopurinol Swelling, Hives and Itching   Codeine Nausea Only   Latex Other (See Comments)    Removes skin     Medication list after today's encounter   Current Outpatient Medications:    aspirin EC 81 MG tablet, Take 81 mg by mouth in the morning. Swallow whole., Disp: , Rfl:    atorvastatin (LIPITOR) 40 MG tablet, Take 40 mg by mouth in the morning., Disp: , Rfl:    BREO ELLIPTA 100-25 MCG/ACT AEPB, Inhale 1 puff into the lungs daily., Disp: , Rfl:    buPROPion (WELLBUTRIN XL) 150 MG 24 hr tablet, Take 150 mg by mouth in the morning., Disp: , Rfl:    clonazePAM (KLONOPIN) 0.5 MG tablet, Take 0.5 mg by mouth daily as needed for anxiety., Disp: , Rfl:    famotidine (PEPCID) 20 MG tablet, Take 20 mg by mouth every evening., Disp: , Rfl:    Febuxostat 80 MG TABS, Take 80 mg by mouth every evening., Disp: , Rfl:    fenofibrate micronized (LOFIBRA) 67 MG capsule, Take 67 mg by mouth in the morning., Disp: , Rfl: 5   levothyroxine (SYNTHROID, LEVOTHROID) 112 MCG tablet, Take 56-224 mcg by mouth See admin instructions. Take 2 tablets (224 mcg) by mouth on Mondays through Saturdays before breakfast. Take 0.5 tablet (56 mcg) by mouth on Sundays before breakfast., Disp: , Rfl:    montelukast (SINGULAIR) 10 MG tablet, Take 10 mg by mouth at bedtime., Disp: , Rfl:    valACYclovir (VALTREX) 500 MG tablet, Take 500 mg by mouth daily as needed (Fever Blisters)., Disp: , Rfl:    amLODipine (NORVASC) 10 MG tablet, Take 1 tablet (10 mg total) by mouth every morning., Disp: 90 tablet, Rfl: 3   chlorthalidone (HYGROTON) 25 MG tablet, Take 1 tablet (25 mg total) by mouth every morning., Disp: 90 tablet, Rfl: 3   metoprolol succinate (TOPROL XL) 50 MG 24 hr tablet,  Take 1 tablet (50 mg total) by mouth at bedtime., Disp: 90 tablet, Rfl: 3   olmesartan (BENICAR) 40 MG tablet, Take 1 tablet (40 mg total) by mouth every evening., Disp: 90 tablet, Rfl: 3   potassium chloride (KLOR-CON) 10 MEQ tablet, Take 1 tablet (10 mEq total) by mouth every morning., Disp: 90 tablet, Rfl: 3  Laboratory examination:   Lab Results  Component Value Date   NA 133 (L) 11/09/2022   K 4.6 11/09/2022   CO2 22 11/09/2022   GLUCOSE 92 11/09/2022   BUN 40 (H) 11/09/2022   CREATININE 1.19 (H) 11/09/2022   CALCIUM 10.3 11/09/2022   EGFR 48 (L) 11/09/2022   GFRNONAA >60 01/03/2022       Latest Ref Rng & Units 11/09/2022   10:06 AM 10/26/2022   11:30 AM 01/03/2022    1:55 AM  BMP  Glucose 70 - 99 mg/dL 92  97  101   BUN 8 - 27 mg/dL 40  40  12   Creatinine 0.57 - 1.00 mg/dL 1.19  1.30  0.75   BUN/Creat Ratio 12 - 28 34  31    Sodium 134 - 144 mmol/L 133  136  139   Potassium 3.5 - 5.2 mmol/L 4.6  4.6  3.6   Chloride 96 - 106 mmol/L 97  99  107   CO2 20 - 29 mmol/L _0 Calcium 8.7 - 10.3 mg/dL 10.3  10.1  9.5       Latest Ref Rng & Units 01/02/2022   10:16 AM 01/01/2022    9:42 AM 01/20/2016    3:03 PM  Hepatic Function  Total Protein 6.5 - 8.1 g/dL 5.3  7.2  7.4   Albumin 3.5 - 5.0 g/dL 3.1  4.4  4.5   AST 15 - 41 U/L _1 ALT 0 - 44 U/L _2 Alk Phosphatase 38 - 126 U/L 42  61  41   Total Bilirubin 0.3 - 1.2 mg/dL 0.7  0.9  0.5     Lipid Panel Recent Labs    01/02/22 0500  CHOL 165  TRIG 90  LDLCALC 71  VLDL 18  HDL 76  CHOLHDL 2.2    HEMOGLOBIN A1C Lab Results  Component Value Date   HGBA1C 4.9 01/02/2022   MPG 93.93 01/02/2022   External labs:  Labs 10/02/2022:  Serum glucose 88 mg, BUN 16, creatinine 0.87, EGFR 70 mL, potassium 3.9, AST 50, ALT 58 mildly elevated.  Hb 12.9/HCT 39.6, platelets 191, normal indicis.  Uric acid low at 1.6. Radiology:   MRI Brain 01/01/2022: Punctate acute cortical infarct within the  left parietal lobe Two additional punctate foci of diffusion-weighted hyperintensity, one within the medial left frontal lobe (left ACA vascular territory), and the other within the right precentral gyrus (right MCA vascular territory).     Cardiac Studies:   Echocardiogram 01/02/2022:   1. Left ventricular ejection fraction, by estimation, is 60 to 65%. The  left ventricle has normal function. The left ventricle has no regional  wall motion abnormalities. Left ventricular diastolic parameters are consistent with Grade I diastolic dysfunction (impaired relaxation). Elevated left ventricular end-diastolic pressure.   2. Right ventricular systolic function is normal. The right ventricular size is normal. There is mildly elevated pulmonary artery systolic pressure.   3. Left atrial size was severely dilated.   4. The mitral valve is normal in structure. Trivial mitral valve regurgitation. No evidence of mitral stenosis.   5. The aortic valve is tricuspid. Aortic valve regurgitation is not visualized. No aortic stenosis is present.   6. The inferior vena cava is normal in size with greater than 50% respiratory variability, suggesting right atrial pressure of 3 mmHg.   TEE 09/23/2022:  1. Left ventricular ejection fraction,  by estimation, is 60 to 65%. The left ventricle has normal function. The left ventricle has no regional wall motion abnormalities. Left ventricular diastolic function could not be evaluated. Elevated left atrial pressure.  2. Left atrial size was visually appears moderate to severe dilatation. No left atrial/left atrial appendage thrombus was detected. The LAA emptying velocity was 17 cm/s.  Evidence of atrial level shunting detected by color flow Doppler. Agitated saline contrast bubble study was positive with shunting observed within 3-6 cardiac cycles suggestive of interatrial shunt. There is a patent foramen ovale with predominantly  right to left shunting across the atrial  septum. 3.  Mild MR and mild to moderate TR. 4. Aorta: The aortic root and ascending aorta are structurally normal, with no evidence of dilitation. There is minimal (Grade I) layered plaque  involving the descending aorta and aortic arch.  Comparison(s): Prior study 01/02/2022: LVEF 60-65%, G1DD, elevated LVEDP, severe LAE, trivial MR, estimated RAP 6mHG.  [employer] LUX Loop   Remote loop recorder transmission 10/16/2022: Predominant rhythm is normal sinus rhythm. Atrial fibrillation = PACs   EKG:   EKG 09/04/2022: Normal sinus rhythm at rate of 70 bpm, normal axis, no evidence of ischemia, normal EKG.    Assessment     ICD-10-CM   1. Primary hypertension  I10 amLODipine (NORVASC) 10 MG tablet    chlorthalidone (HYGROTON) 25 MG tablet    metoprolol succinate (TOPROL XL) 50 MG 24 hr tablet    olmesartan (BENICAR) 40 MG tablet    potassium chloride (KLOR-CON) 10 MEQ tablet    2. 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 [employer] LUX Loop  Z95.818       No orders of the defined types were placed in this encounter.  Meds ordered this encounter  Medications   amLODipine (NORVASC) 10 MG tablet    Sig: Take 1 tablet (10 mg total) by mouth every morning.    Dispense:  90 tablet    Refill:  3   chlorthalidone (HYGROTON) 25 MG tablet    Sig: Take 1 tablet (25 mg total) by mouth every morning.    Dispense:  90 tablet    Refill:  3   metoprolol succinate (TOPROL XL) 50 MG 24 hr tablet    Sig: Take 1 tablet (50 mg total) by mouth at bedtime.    Dispense:  90 tablet    Refill:  3   olmesartan (BENICAR) 40 MG tablet    Sig: Take 1 tablet (40 mg total) by mouth every evening.    Dispense:  90 tablet    Refill:  3   potassium chloride (KLOR-CON) 10 MEQ tablet    Sig: Take 1 tablet (10 mEq total) by mouth every morning.    Dispense:  90 tablet    Refill:  3   Medications Discontinued During This Encounter   Medication Reason   metoprolol succinate (TOPROL XL) 50 MG 24 hr tablet Reorder   amLODipine (NORVASC) 10 MG tablet Reorder   olmesartan (BENICAR) 40 MG tablet Reorder   chlorthalidone (HYGROTON) 25 MG tablet Reorder   potassium chloride (KLOR-CON) 10 MEQ tablet Reorder     Recommendations:   Felicia STRAUSERis a 76y.o.  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    She underwent [employer] Lux DX ILR on 02/10/2022. She has no symptoms of palpitations.  She underwent TEE on 09/23/2021 revealing markedly enlarged left atrium, no intracardiac thrombus, however there was a moderate-sized PFO.  1. Primary hypertension Since making changes to her medications, she is tolerating diuretics and also ARB and renal function has remained stable at stage IIIa chronic kidney disease, potassium levels are also stabilized and normal.  Continue present medications.  Blood pressure under excellent control and she remains asymptomatic.  2. 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  Blood pressure is now well controlled.  I also reviewed the data from the loop recorder, no evidence of atrial fibrillation.  3. [employer] LUX Loop She will continue with remote transmissions.  For now continue aspirin indefinitely.  I will see her back in a year or sooner if problems.  I refilled all her prescriptions.   Adrian Prows, MD, [facility] 11/10/2022, 11:06 AM Office: [phone]

## 2022-11-23 DIAGNOSIS — Z4509 Encounter for adjustment and management of other cardiac device: Secondary | ICD-10-CM | POA: Diagnosis not present

## 2022-11-23 DIAGNOSIS — I639 Cerebral infarction, unspecified: Secondary | ICD-10-CM | POA: Diagnosis not present

## 2022-11-23 DIAGNOSIS — Z95818 Presence of other cardiac implants and grafts: Secondary | ICD-10-CM | POA: Diagnosis not present

## 2022-11-30 ENCOUNTER — Telehealth: Payer: Self-pay

## 2022-11-30 NOTE — Telephone Encounter (Signed)
Patient asked if she needs an antibiotic before her dental cleaning on 12/03/2022

## 2022-11-30 NOTE — Telephone Encounter (Signed)
She does not need this

## 2022-11-30 NOTE — Telephone Encounter (Signed)
Pt aware.

## 2022-12-15 ENCOUNTER — Telehealth: Payer: Self-pay | Admitting: Cardiology

## 2022-12-15 NOTE — Telephone Encounter (Signed)
Patient needs to discuss her chlorthalidone medication w/you. Please call her back.

## 2022-12-16 NOTE — Telephone Encounter (Signed)
Spoke with patient.

## 2022-12-28 DIAGNOSIS — I639 Cerebral infarction, unspecified: Secondary | ICD-10-CM | POA: Diagnosis not present

## 2022-12-28 DIAGNOSIS — Z4509 Encounter for adjustment and management of other cardiac device: Secondary | ICD-10-CM | POA: Diagnosis not present

## 2022-12-28 DIAGNOSIS — Z95818 Presence of other cardiac implants and grafts: Secondary | ICD-10-CM | POA: Diagnosis not present

## 2023-02-01 DIAGNOSIS — Z95818 Presence of other cardiac implants and grafts: Secondary | ICD-10-CM | POA: Diagnosis not present

## 2023-02-01 DIAGNOSIS — Z4509 Encounter for adjustment and management of other cardiac device: Secondary | ICD-10-CM | POA: Diagnosis not present

## 2023-02-01 DIAGNOSIS — I639 Cerebral infarction, unspecified: Secondary | ICD-10-CM | POA: Diagnosis not present

## 2023-02-16 DIAGNOSIS — L308 Other specified dermatitis: Secondary | ICD-10-CM | POA: Diagnosis not present

## 2023-02-16 DIAGNOSIS — Z85828 Personal history of other malignant neoplasm of skin: Secondary | ICD-10-CM | POA: Diagnosis not present

## 2023-03-08 DIAGNOSIS — I639 Cerebral infarction, unspecified: Secondary | ICD-10-CM | POA: Diagnosis not present

## 2023-03-08 DIAGNOSIS — Z4509 Encounter for adjustment and management of other cardiac device: Secondary | ICD-10-CM | POA: Diagnosis not present

## 2023-03-08 DIAGNOSIS — Z95818 Presence of other cardiac implants and grafts: Secondary | ICD-10-CM | POA: Diagnosis not present

## 2023-03-24 DIAGNOSIS — Z6820 Body mass index (BMI) 20.0-20.9, adult: Secondary | ICD-10-CM | POA: Diagnosis not present

## 2023-03-24 DIAGNOSIS — M1991 Primary osteoarthritis, unspecified site: Secondary | ICD-10-CM | POA: Diagnosis not present

## 2023-03-24 DIAGNOSIS — R768 Other specified abnormal immunological findings in serum: Secondary | ICD-10-CM | POA: Diagnosis not present

## 2023-03-24 DIAGNOSIS — L309 Dermatitis, unspecified: Secondary | ICD-10-CM | POA: Diagnosis not present

## 2023-03-24 DIAGNOSIS — I7300 Raynaud's syndrome without gangrene: Secondary | ICD-10-CM | POA: Diagnosis not present

## 2023-03-24 DIAGNOSIS — M1A9XX1 Chronic gout, unspecified, with tophus (tophi): Secondary | ICD-10-CM | POA: Diagnosis not present

## 2023-04-12 DIAGNOSIS — I639 Cerebral infarction, unspecified: Secondary | ICD-10-CM | POA: Diagnosis not present

## 2023-04-12 DIAGNOSIS — Z4509 Encounter for adjustment and management of other cardiac device: Secondary | ICD-10-CM | POA: Diagnosis not present

## 2023-04-12 DIAGNOSIS — Z95818 Presence of other cardiac implants and grafts: Secondary | ICD-10-CM | POA: Diagnosis not present

## 2023-04-26 DIAGNOSIS — R7989 Other specified abnormal findings of blood chemistry: Secondary | ICD-10-CM | POA: Diagnosis not present

## 2023-05-04 DIAGNOSIS — N1831 Chronic kidney disease, stage 3a: Secondary | ICD-10-CM | POA: Diagnosis not present

## 2023-05-04 DIAGNOSIS — E890 Postprocedural hypothyroidism: Secondary | ICD-10-CM | POA: Diagnosis not present

## 2023-05-04 DIAGNOSIS — R945 Abnormal results of liver function studies: Secondary | ICD-10-CM | POA: Diagnosis not present

## 2023-05-04 DIAGNOSIS — I669 Occlusion and stenosis of unspecified cerebral artery: Secondary | ICD-10-CM | POA: Diagnosis not present

## 2023-05-04 DIAGNOSIS — I129 Hypertensive chronic kidney disease with stage 1 through stage 4 chronic kidney disease, or unspecified chronic kidney disease: Secondary | ICD-10-CM | POA: Diagnosis not present

## 2023-05-04 DIAGNOSIS — M109 Gout, unspecified: Secondary | ICD-10-CM | POA: Diagnosis not present

## 2023-05-04 DIAGNOSIS — E785 Hyperlipidemia, unspecified: Secondary | ICD-10-CM | POA: Diagnosis not present

## 2023-05-04 DIAGNOSIS — I10 Essential (primary) hypertension: Secondary | ICD-10-CM | POA: Diagnosis not present

## 2023-05-04 DIAGNOSIS — I499 Cardiac arrhythmia, unspecified: Secondary | ICD-10-CM | POA: Diagnosis not present

## 2023-05-04 DIAGNOSIS — G319 Degenerative disease of nervous system, unspecified: Secondary | ICD-10-CM | POA: Diagnosis not present

## 2023-05-17 DIAGNOSIS — Z95818 Presence of other cardiac implants and grafts: Secondary | ICD-10-CM | POA: Diagnosis not present

## 2023-05-17 DIAGNOSIS — Z4509 Encounter for adjustment and management of other cardiac device: Secondary | ICD-10-CM | POA: Diagnosis not present

## 2023-05-17 DIAGNOSIS — I639 Cerebral infarction, unspecified: Secondary | ICD-10-CM | POA: Diagnosis not present

## 2023-06-21 DIAGNOSIS — I639 Cerebral infarction, unspecified: Secondary | ICD-10-CM | POA: Diagnosis not present

## 2023-06-21 DIAGNOSIS — Z4509 Encounter for adjustment and management of other cardiac device: Secondary | ICD-10-CM | POA: Diagnosis not present

## 2023-06-21 DIAGNOSIS — Z95818 Presence of other cardiac implants and grafts: Secondary | ICD-10-CM | POA: Diagnosis not present

## 2023-06-24 ENCOUNTER — Encounter: Payer: Self-pay | Admitting: Cardiology

## 2023-07-05 DIAGNOSIS — Z1231 Encounter for screening mammogram for malignant neoplasm of breast: Secondary | ICD-10-CM | POA: Diagnosis not present

## 2023-07-26 DIAGNOSIS — Z4509 Encounter for adjustment and management of other cardiac device: Secondary | ICD-10-CM | POA: Diagnosis not present

## 2023-07-26 DIAGNOSIS — I639 Cerebral infarction, unspecified: Secondary | ICD-10-CM | POA: Diagnosis not present

## 2023-07-26 DIAGNOSIS — Z95818 Presence of other cardiac implants and grafts: Secondary | ICD-10-CM | POA: Diagnosis not present

## 2023-08-01 IMAGING — US US RENAL
1 series · 14 of 25 positions shown · non-contrast
Comparison: Ultrasound from 08/13/2016.

CLINICAL DATA: Initial evaluation for chronic kidney disease.

EXAM:
RENAL / URINARY TRACT ULTRASOUND COMPLETE

[Series 1: us renal · 0.23mm/px · 14 of 36 slices shown]
[im 1/36]
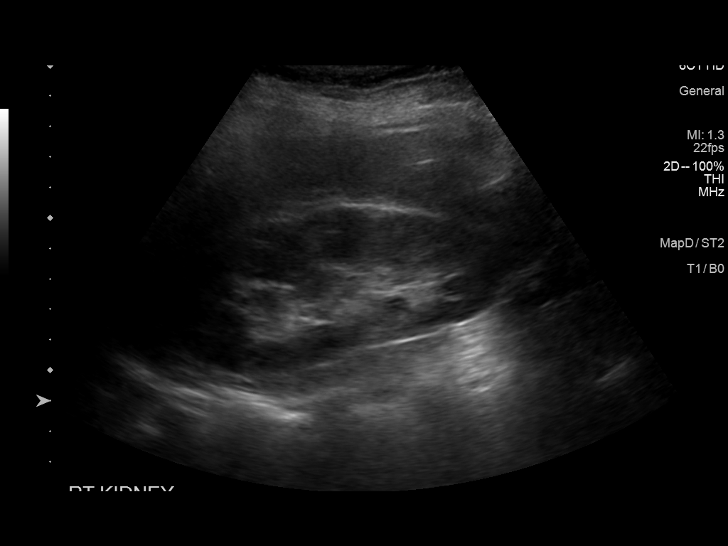
[im 3/36]
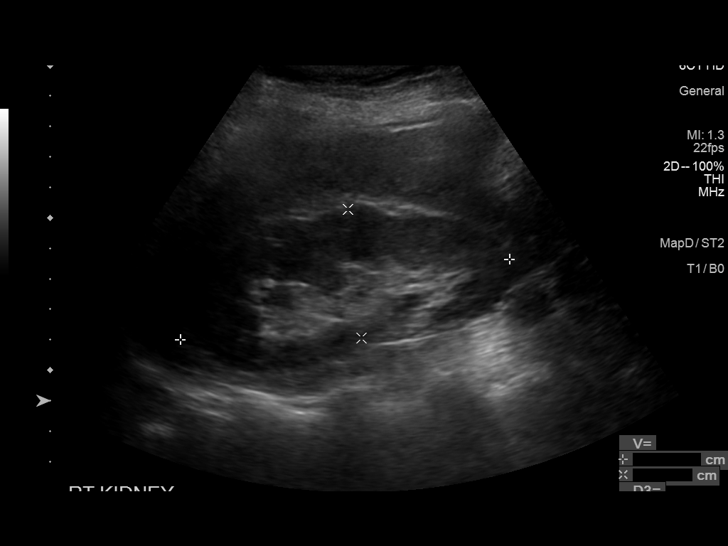
[im 6/36]
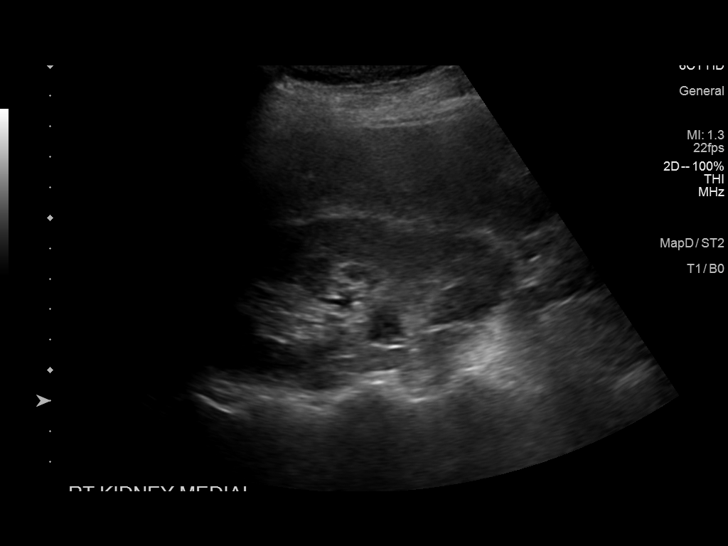
[im 9/36]
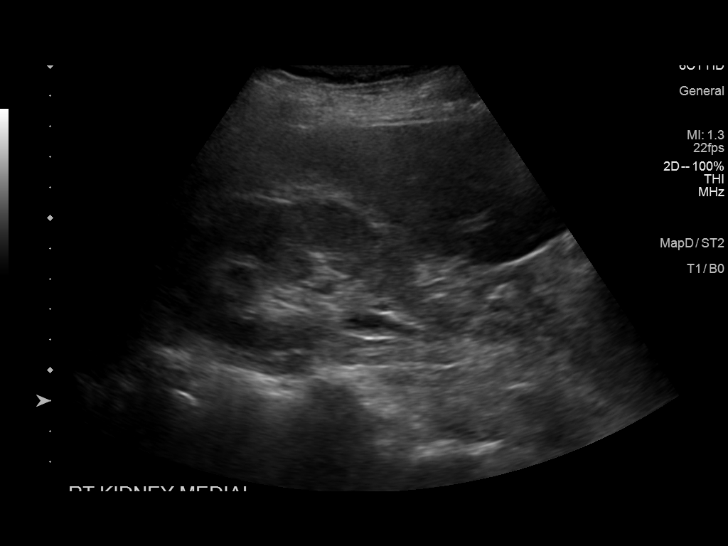
[im 12/36]
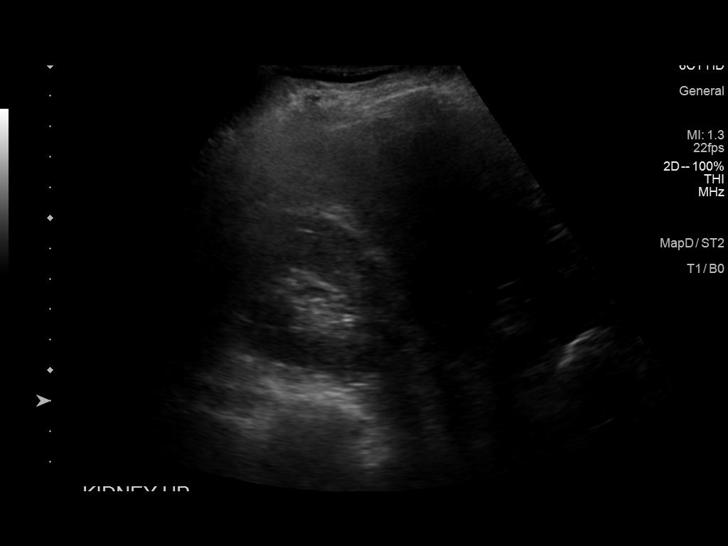
[im 14/36]
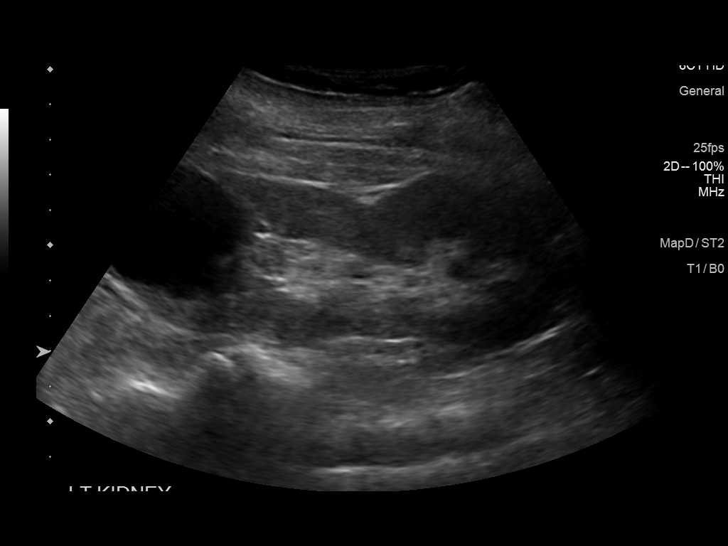
[im 17/36]
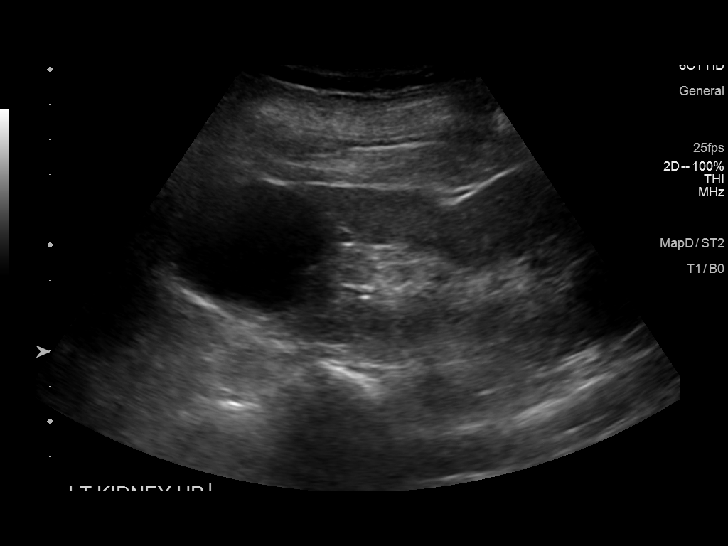
[im 19/36]
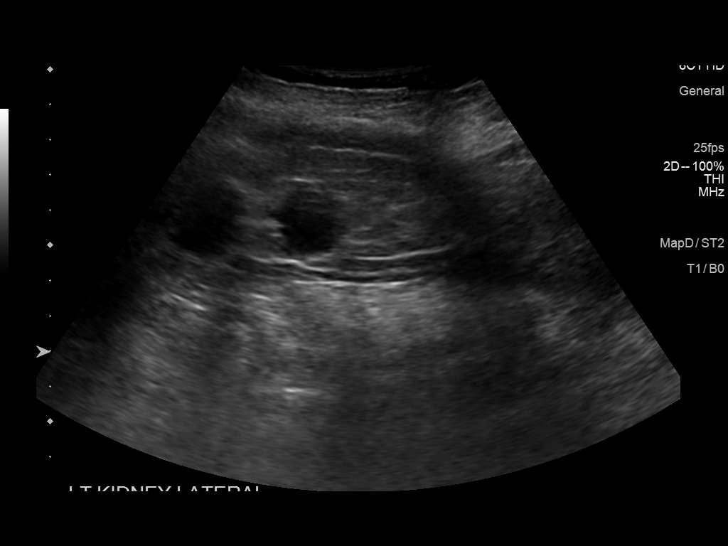
[im 22/36]
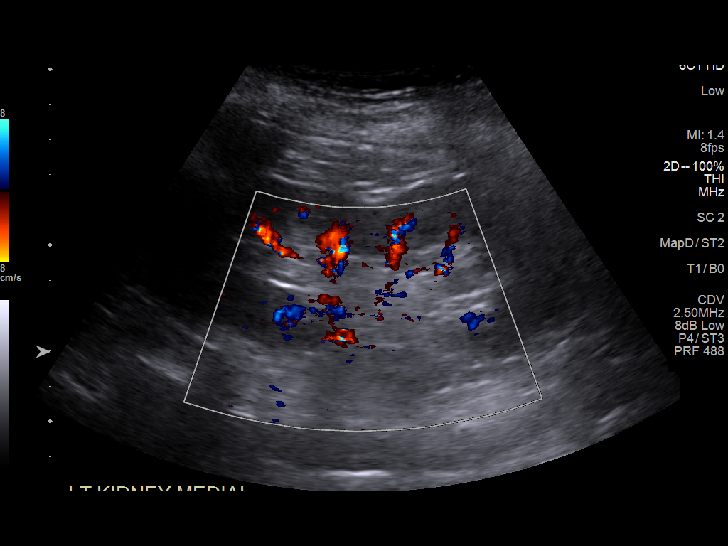
[im 24/36]
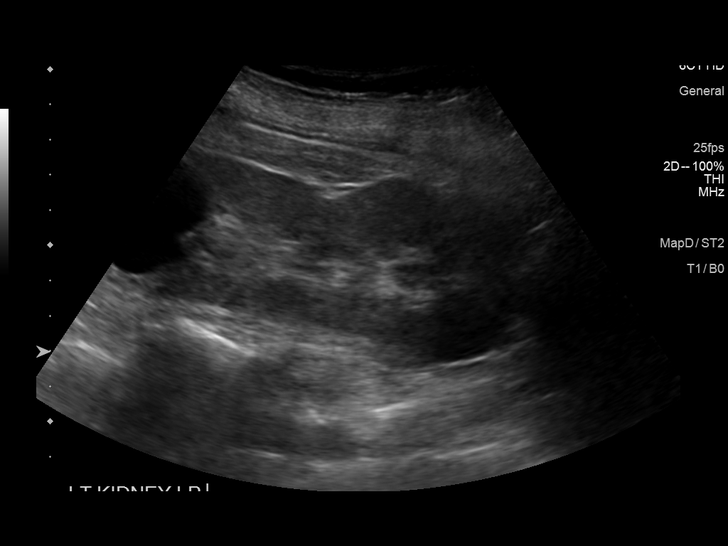
[im 27/36]
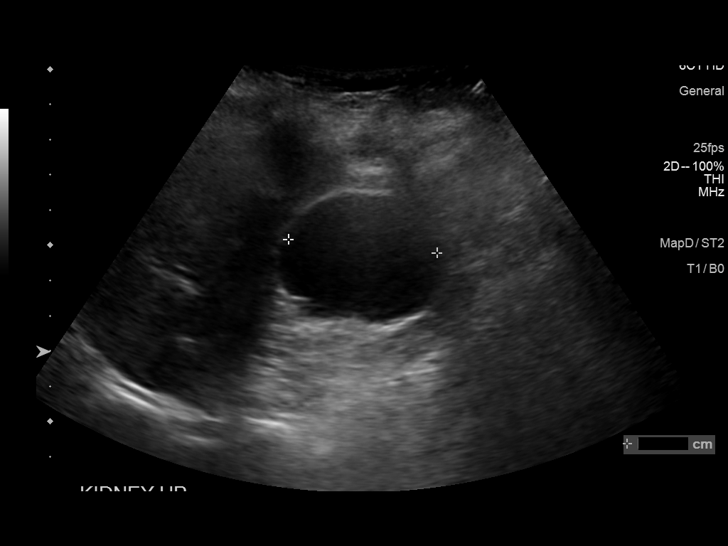
[im 30/36]
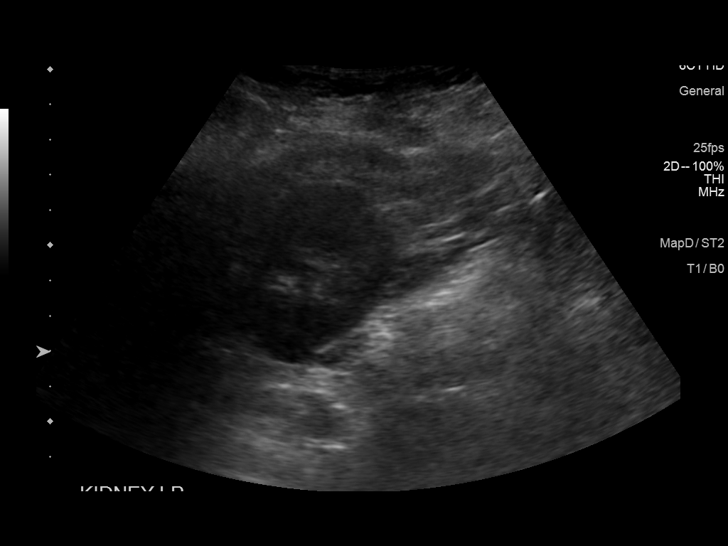
[im 33/36]
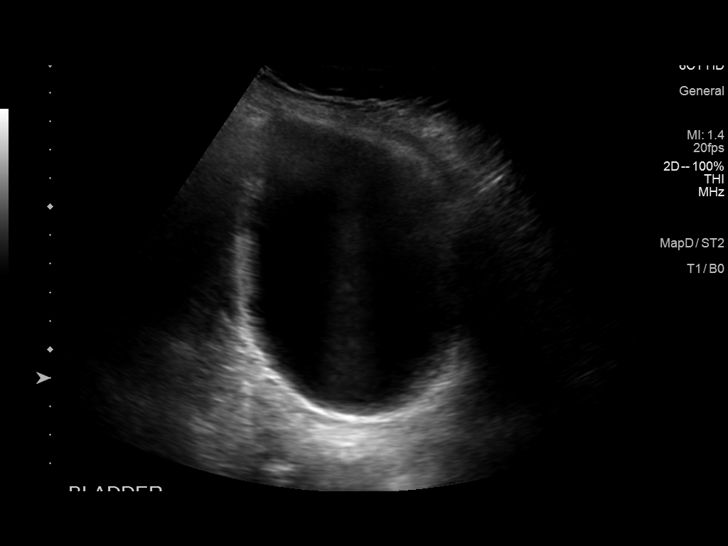
[im 36/36]
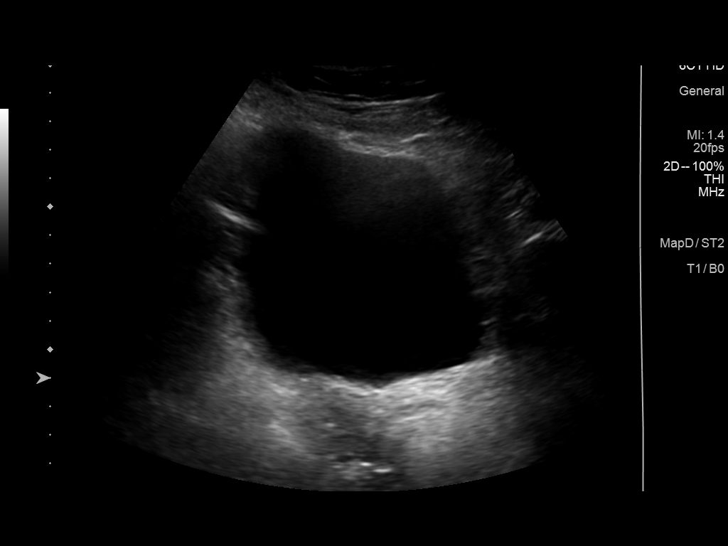

[14 of 25 positions shown; findings below may reference images not displayed]

FINDINGS: Right Kidney:

Renal measurements: 11.1 x 4.3 x 3.7 cm = volume: 92.3 mL. Renal
echogenicity within normal limits. No nephrolithiasis or
hydronephrosis. Small extrarenal pelvis noted. No focal renal mass.

Left Kidney:

Renal measurements: 11.9 x 4.4 x 4.6 cm = volume: 125.2 mL. Renal
echogenicity within normal limits. No nephrolithiasis or
hydronephrosis. Simple cyst positioned at the upper pole measures up
to 4.3 cm in size. The adjacent simple cyst measuring up to 1.9 cm
present at the mid-upper pole. 2.2 cm simple cyst present at the
lower pole.

Bladder:

Appears normal for degree of bladder distention.

Other:

None.
IMPRESSION: 1. Renal echogenicity within normal limits. No nephrolithiasis or
hydronephrosis.
2. Multiple simple left renal cysts measuring up to 4.3 cm as above.

## 2023-08-08 IMAGING — MR MR MRA HEAD W/O CM
1 series · 19 of 48 positions shown · non-contrast
Comparison: Brain MRI 01/01/2022.

CLINICAL DATA: 75-year-old female with neurologic deficit. Several
scattered punctate bilateral MCA infarcts on MRI yesterday.

EXAM:
MRA HEAD WITHOUT CONTRAST
TECHNIQUE: Angiographic images of the Circle of Willis were acquired using MRA
technique without intravenous contrast.

[Series 5: 3d cow · axial · 0.5mm · 0.41mm/px · z∈[-63,+18]mm · 19 of 172 slices shown]
[im 1/172]
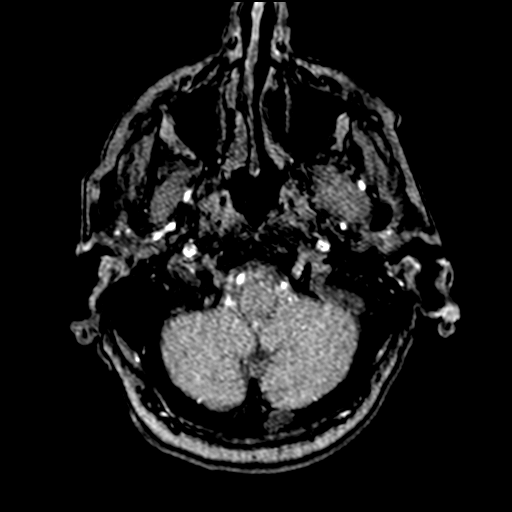
[im 4/172]
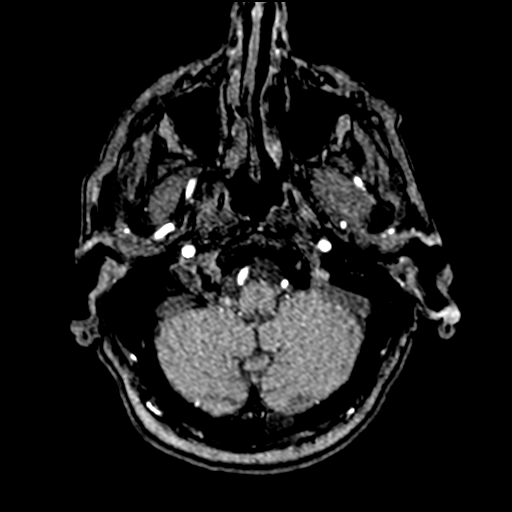
[im 8/172]
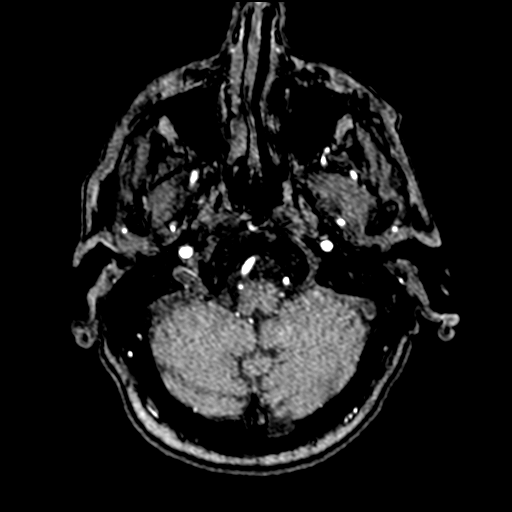
[im 11/172]
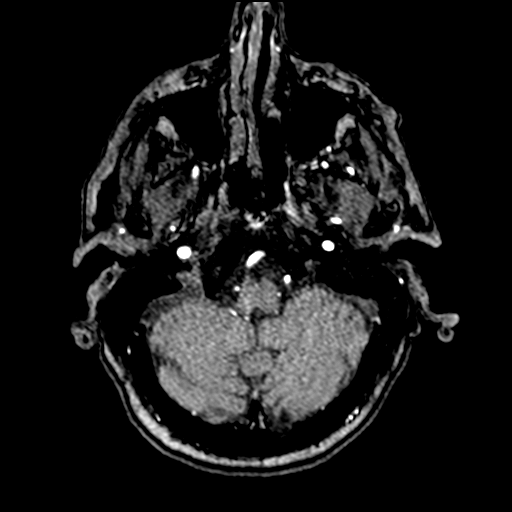
[im 15/172]
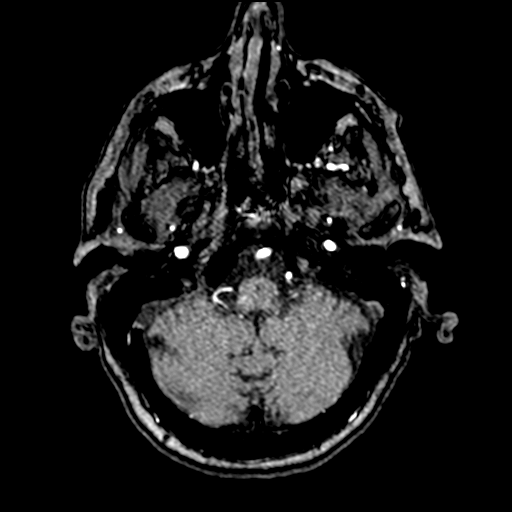
[im 19/172]
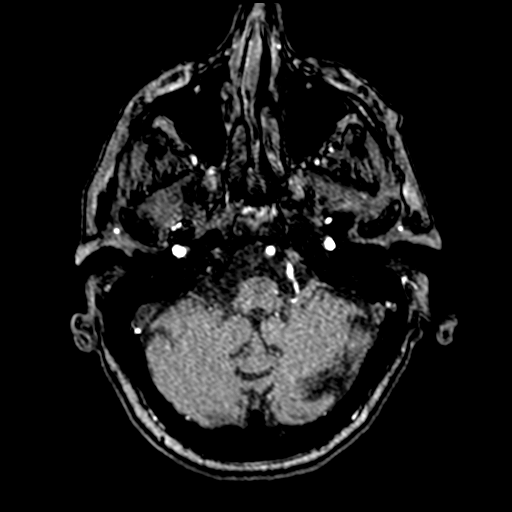
[im 22/172]
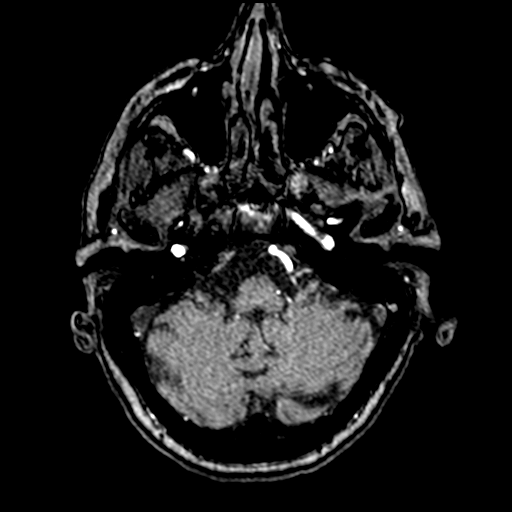
[im 26/172]
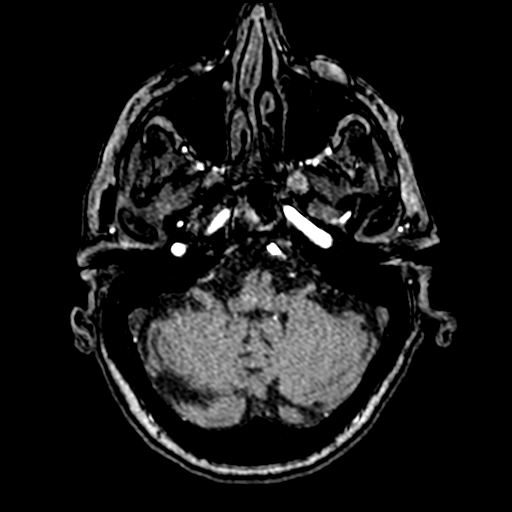
[im 30/172]
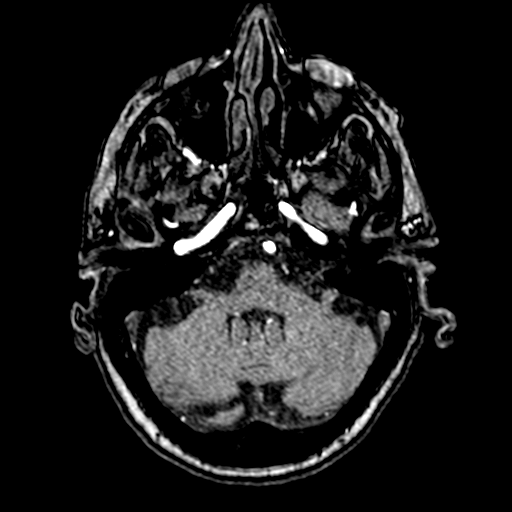
[im 33/172]
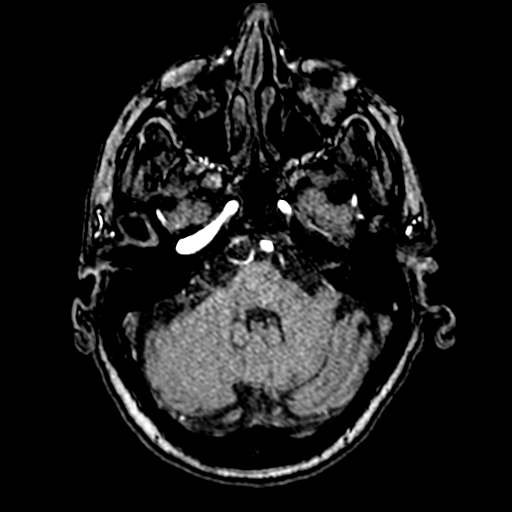
[im 37/172]
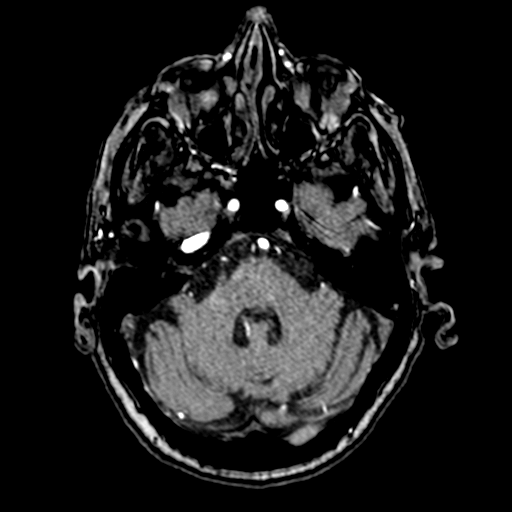
[im 55/172]
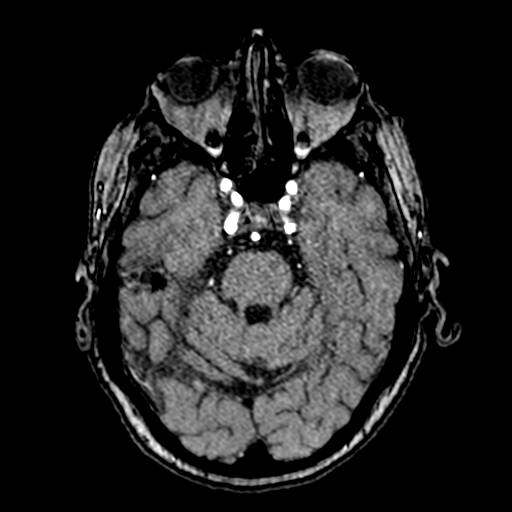
[im 77/172]
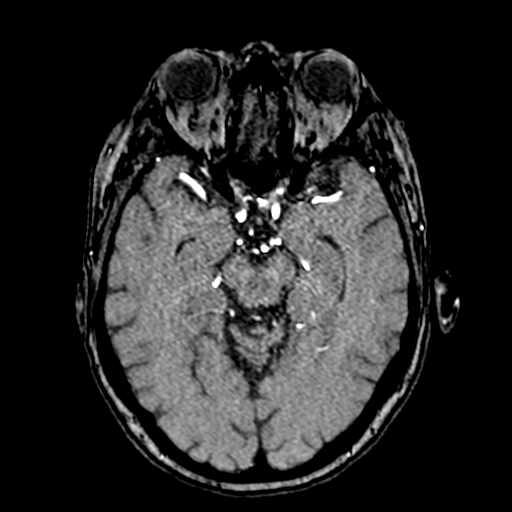
[im 88/172]
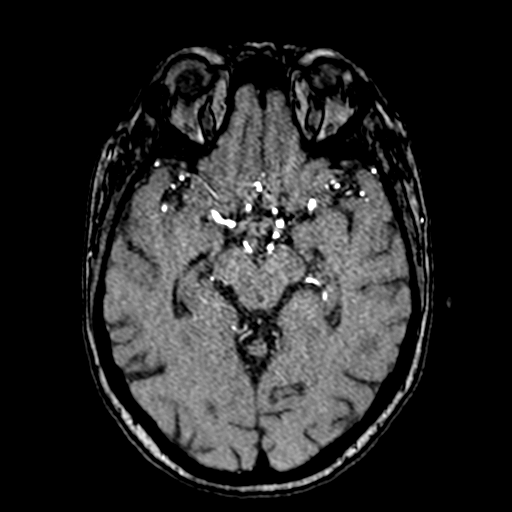
[im 99/172]
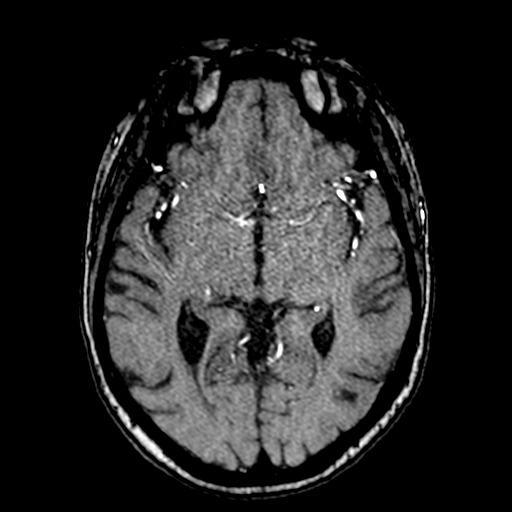
[im 121/172]
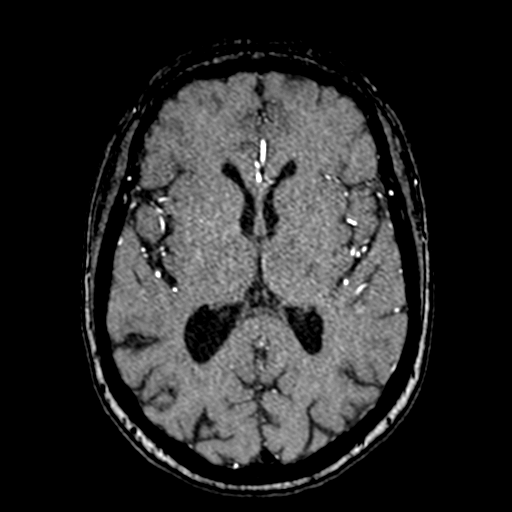
[im 142/172]
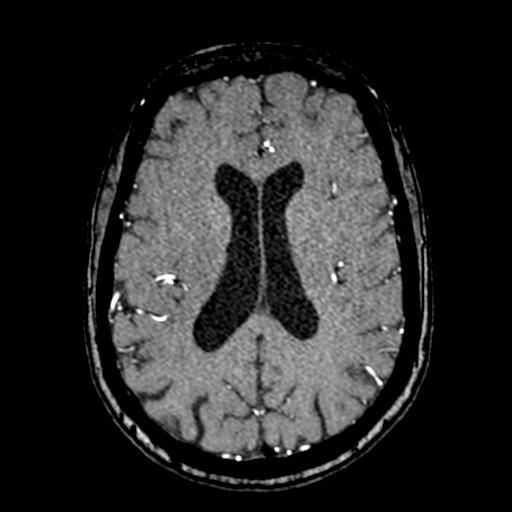
[im 146/172]
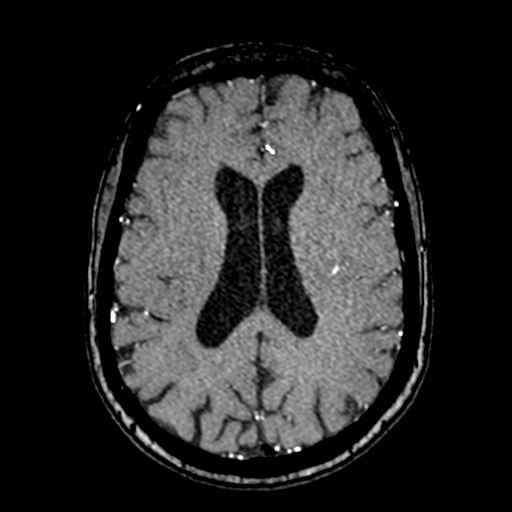
[im 164/172]
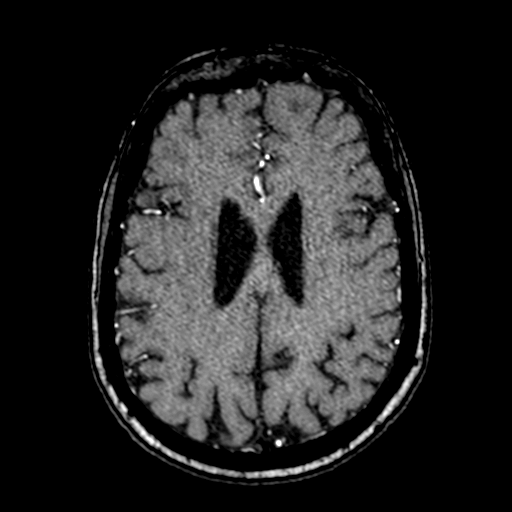

[19 of 48 positions shown; findings below may reference images not displayed]

FINDINGS: Posterior circulation: Antegrade flow in the distal vertebral
arteries, the right appears mildly dominant. Normal PICA origins and
vertebrobasilar junction. No distal vertebral or basilar artery
stenosis. Tortuous basilar. SCA and PCA origins are patent with
right posterior communicating artery and tortuous left PCA P1
segment. Bilateral PCA branches are within normal limits.

Anterior circulation: Antegrade flow in both ICA siphons. No siphon
stenosis. Right posterior communicating artery and bilateral
ophthalmic artery origins appear normal. Patent carotid termini.
Patent MCA and ACA origins. Tortuous right ACA A1 segment with
probable fenestration of the anterior communicating artery (normal
variant). Visible bilateral ACA branches are within normal limits.
MCA M1 segments and MCA bifurcations appear patent without stenosis.
Visible bilateral MCA branches are within normal limits.

Anatomic variants: Fenestrated anterior communicating arteries
suspected.

Other: No intracranial mass effect or ventriculomegaly.
IMPRESSION: Negative intracranial MRA.

## 2023-08-09 DIAGNOSIS — N1831 Chronic kidney disease, stage 3a: Secondary | ICD-10-CM | POA: Diagnosis not present

## 2023-08-09 DIAGNOSIS — E890 Postprocedural hypothyroidism: Secondary | ICD-10-CM | POA: Diagnosis not present

## 2023-08-09 DIAGNOSIS — I129 Hypertensive chronic kidney disease with stage 1 through stage 4 chronic kidney disease, or unspecified chronic kidney disease: Secondary | ICD-10-CM | POA: Diagnosis not present

## 2023-08-09 DIAGNOSIS — E785 Hyperlipidemia, unspecified: Secondary | ICD-10-CM | POA: Diagnosis not present

## 2023-08-30 DIAGNOSIS — I639 Cerebral infarction, unspecified: Secondary | ICD-10-CM | POA: Diagnosis not present

## 2023-08-30 DIAGNOSIS — Z4509 Encounter for adjustment and management of other cardiac device: Secondary | ICD-10-CM | POA: Diagnosis not present

## 2023-08-30 DIAGNOSIS — Z95818 Presence of other cardiac implants and grafts: Secondary | ICD-10-CM | POA: Diagnosis not present

## 2023-09-07 DIAGNOSIS — G43009 Migraine without aura, not intractable, without status migrainosus: Secondary | ICD-10-CM | POA: Diagnosis not present

## 2023-09-07 DIAGNOSIS — I129 Hypertensive chronic kidney disease with stage 1 through stage 4 chronic kidney disease, or unspecified chronic kidney disease: Secondary | ICD-10-CM | POA: Diagnosis not present

## 2023-09-07 DIAGNOSIS — J45909 Unspecified asthma, uncomplicated: Secondary | ICD-10-CM | POA: Diagnosis not present

## 2023-09-07 DIAGNOSIS — I8310 Varicose veins of unspecified lower extremity with inflammation: Secondary | ICD-10-CM | POA: Diagnosis not present

## 2023-09-07 DIAGNOSIS — K219 Gastro-esophageal reflux disease without esophagitis: Secondary | ICD-10-CM | POA: Diagnosis not present

## 2023-09-07 DIAGNOSIS — N1831 Chronic kidney disease, stage 3a: Secondary | ICD-10-CM | POA: Diagnosis not present

## 2023-09-07 DIAGNOSIS — G319 Degenerative disease of nervous system, unspecified: Secondary | ICD-10-CM | POA: Diagnosis not present

## 2023-09-07 DIAGNOSIS — I669 Occlusion and stenosis of unspecified cerebral artery: Secondary | ICD-10-CM | POA: Diagnosis not present

## 2023-09-07 DIAGNOSIS — E785 Hyperlipidemia, unspecified: Secondary | ICD-10-CM | POA: Diagnosis not present

## 2023-09-07 DIAGNOSIS — E890 Postprocedural hypothyroidism: Secondary | ICD-10-CM | POA: Diagnosis not present

## 2023-09-07 DIAGNOSIS — D126 Benign neoplasm of colon, unspecified: Secondary | ICD-10-CM | POA: Diagnosis not present

## 2023-09-07 DIAGNOSIS — I10 Essential (primary) hypertension: Secondary | ICD-10-CM | POA: Diagnosis not present

## 2023-09-10 ENCOUNTER — Encounter: Payer: Self-pay | Admitting: Cardiology

## 2023-09-20 DIAGNOSIS — I129 Hypertensive chronic kidney disease with stage 1 through stage 4 chronic kidney disease, or unspecified chronic kidney disease: Secondary | ICD-10-CM | POA: Diagnosis not present

## 2023-09-20 DIAGNOSIS — N1831 Chronic kidney disease, stage 3a: Secondary | ICD-10-CM | POA: Diagnosis not present

## 2023-09-22 DIAGNOSIS — Z79899 Other long term (current) drug therapy: Secondary | ICD-10-CM | POA: Diagnosis not present

## 2023-09-22 DIAGNOSIS — R7989 Other specified abnormal findings of blood chemistry: Secondary | ICD-10-CM | POA: Diagnosis not present

## 2023-09-22 DIAGNOSIS — Z6821 Body mass index (BMI) 21.0-21.9, adult: Secondary | ICD-10-CM | POA: Diagnosis not present

## 2023-09-22 DIAGNOSIS — I7300 Raynaud's syndrome without gangrene: Secondary | ICD-10-CM | POA: Diagnosis not present

## 2023-09-22 DIAGNOSIS — M1A9XX1 Chronic gout, unspecified, with tophus (tophi): Secondary | ICD-10-CM | POA: Diagnosis not present

## 2023-09-22 DIAGNOSIS — M1991 Primary osteoarthritis, unspecified site: Secondary | ICD-10-CM | POA: Diagnosis not present

## 2023-09-22 DIAGNOSIS — R768 Other specified abnormal immunological findings in serum: Secondary | ICD-10-CM | POA: Diagnosis not present

## 2023-10-18 ENCOUNTER — Ambulatory Visit: Payer: Medicare Other

## 2023-10-18 DIAGNOSIS — I639 Cerebral infarction, unspecified: Secondary | ICD-10-CM

## 2023-10-19 DIAGNOSIS — Z23 Encounter for immunization: Secondary | ICD-10-CM | POA: Diagnosis not present

## 2023-10-20 ENCOUNTER — Telehealth: Payer: Self-pay | Admitting: Cardiology

## 2023-10-20 LAB — CUP PACEART REMOTE DEVICE CHECK
Date Time Interrogation Session: 20241021004900
Implantable Pulse Generator Implant Date: 20230214
Pulse Gen Serial Number: 172346

## 2023-10-20 NOTE — Telephone Encounter (Signed)
Calling to speak to the nurse about her mychart message. Please advise

## 2023-10-21 ENCOUNTER — Encounter: Payer: Self-pay | Admitting: Cardiology

## 2023-10-21 NOTE — Telephone Encounter (Signed)
Patient returning call she received this morning. Please advise

## 2023-10-21 NOTE — Telephone Encounter (Signed)
error 

## 2023-10-21 NOTE — Telephone Encounter (Signed)
Spoke with patient and let her know CL will be the one to follow her device but Ottis Stain is in the same office as well. She was concerned JG wouldn't be her gen card anymore. Pt verbalized understanding of remotes

## 2023-11-04 NOTE — Progress Notes (Signed)
[employer] Loop Recorder 

## 2023-11-11 ENCOUNTER — Ambulatory Visit: Payer: Medicare Other | Attending: Cardiology | Admitting: Physician Assistant

## 2023-11-11 ENCOUNTER — Encounter: Payer: Self-pay | Admitting: Physician Assistant

## 2023-11-11 ENCOUNTER — Ambulatory Visit: Payer: Self-pay | Admitting: Cardiology

## 2023-11-11 VITALS — BP 122/72 | HR 65 | Ht 65.0 in | Wt 131.0 lb

## 2023-11-11 DIAGNOSIS — I10 Essential (primary) hypertension: Secondary | ICD-10-CM | POA: Insufficient documentation

## 2023-11-11 DIAGNOSIS — Z95818 Presence of other cardiac implants and grafts: Secondary | ICD-10-CM | POA: Insufficient documentation

## 2023-11-11 DIAGNOSIS — I639 Cerebral infarction, unspecified: Secondary | ICD-10-CM | POA: Insufficient documentation

## 2023-11-11 MED ORDER — OLMESARTAN MEDOXOMIL 40 MG PO TABS: 40.0000 mg | ORAL_TABLET | Freq: Every evening | ORAL | 3 refills | Status: DC

## 2023-11-11 MED ORDER — METOPROLOL SUCCINATE ER 50 MG PO TB24: 50.0000 mg | ORAL_TABLET | Freq: Every day | ORAL | 3 refills | Status: DC

## 2023-11-11 NOTE — Patient Instructions (Signed)
Medication Instructions:  Your physician recommends that you continue on your current medications as directed. Please refer to the Current Medication list given to you today.  *If you need a refill on your cardiac medications before your next appointment, please call your pharmacy*   Lab Work: None ordered  If you have labs (blood work) drawn today and your tests are completely normal, you will receive your results only by: MyChart Message (if you have MyChart) OR A paper copy in the mail If you have any lab test that is abnormal or we need to change your treatment, we will call you to review the results.   Testing/Procedures: None ordered   Follow-Up: At [facility], you and your health needs are our priority.  As part of our continuing mission to provide you with exceptional heart care, we have created designated Provider Care Teams.  These Care Teams include your primary Cardiologist (physician) and Advanced Practice Providers (APPs -  Physician Assistants and Nurse Practitioners) who all work together to provide you with the care you need, when you need it.  We recommend signing up for the patient portal called "MyChart".  Sign up information is provided on this After Visit Summary.  MyChart is used to connect with patients for Virtual Visits (Telemedicine).  Patients are able to view lab/test results, encounter notes, upcoming appointments, etc.  Non-urgent messages can be sent to your provider as well.   To learn more about what you can do with MyChart, go to [email or web address].    Your next appointment:   12 month(s)  Provider:   Yates Decamp, MD     Other Instructions

## 2023-11-11 NOTE — Progress Notes (Signed)
Cardiology Office Note:  .   Date:  11/11/2023  ID:  Felicia Washington, DOB 11-19-1946, MRN [id number] PCP: Adrian Prince, MD  [facility] Providers Cardiologist:  Yates Decamp, MD Electrophysiologist:  Lanier Prude, MD {  History of Present Illness: .   Felicia Washington is a 77 y.o. 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  When she presented back in January 2023 she had word finding difficulty and right sided clumsiness, MRI revealed small acute punctuate cortical infarct involving left and right brain suggestive of embolic infarct.  [employer] ILR on 02/10/2022.  No symptoms of palpitations.  Underwent TEE on 09/23/2021 revealing markedly enlarged left atrium, no intracardiac thrombus, however there was a moderate-sized PFO.  Was seen in the office last year for follow-up of hypertension.  Tolerating all medications.  Underwent labs and did not have any significant complaints at that time.  Today, she presents with a history of hypertension, hyperlipidemia, migraines, and hypothyroidism, presents with leg swelling. She reports that the swelling is dependent, worsening with prolonged standing or sitting, and improves with leg elevation at night. She was previously on chlorthalidone, a diuretic, but it was discontinued due to concerns about lab results. She denies any recent lab abnormalities or kidney issues.  The patient also reports some discomfort after taking potassium chloride, which was added to her regimen last year by another provider. She has experimented with different times to take the medication to minimize the discomfort. She denies any residual effects from a previous stroke and reports no recent migraines. She also has a loop recorder  implanted for monitoring of suspected atrial fibrillation monthly.   Reports no shortness of breath nor dyspnea on exertion. Reports no chest pain, pressure, or tightness. No orthopnea, PND. Reports no palpitations.   Discussed the use of AI scribe software for clinical note transcription with the patient, who gave verbal consent to proceed.  ROS: Pertinent ROS in HPI  Studies Reviewed: Marland Kitchen   EKG Interpretation Date/Time:  Thursday November 11 2023 13:33:48 EST Ventricular Rate:  65 PR Interval:  190 QRS Duration:  86 QT Interval:  398 QTC Calculation: 413 R Axis:   66  Text Interpretation: Sinus rhythm with Premature atrial complexes When compared with ECG of 14-Jan-2022 09:46, Premature atrial complexes are now Present Confirmed by Jari Favre [phone]) on 11/11/2023 1:58:48 PM    Echocardiogram 01/02/2022:   1. Left ventricular ejection fraction, by estimation, is 60 to 65%. The  left ventricle has normal function. The left ventricle has no regional  wall motion abnormalities. Left ventricular diastolic parameters are consistent with Grade I diastolic dysfunction (impaired relaxation). Elevated left ventricular end-diastolic pressure.   2. Right ventricular systolic function is normal. The right ventricular size is normal. There is mildly elevated pulmonary artery systolic pressure.   3. Left atrial size was severely dilated.   4. The mitral valve is normal in structure. Trivial mitral valve regurgitation. No evidence of mitral stenosis.   5. The aortic valve is tricuspid. Aortic valve regurgitation is not visualized. No aortic stenosis is present.   6. The inferior vena cava is normal in size with greater than 50% respiratory variability, suggesting right atrial pressure of 3 mmHg.    TEE 09/23/2022:  1. Left ventricular ejection fraction, by estimation, is 60 to 65%. The left ventricle has normal  function. The left ventricle has no regional wall motion abnormalities. Left ventricular  diastolic function could not be evaluated. Elevated left atrial pressure.  2. Left atrial size was visually appears moderate to severe dilatation. No left atrial/left atrial appendage thrombus was detected. The LAA emptying velocity was 17 cm/s.  Evidence of atrial level shunting detected by color flow Doppler. Agitated saline contrast bubble study was positive with shunting observed within 3-6 cardiac cycles suggestive of interatrial shunt. There is a patent foramen ovale with predominantly  right to left shunting across the atrial septum. 3.  Mild MR and mild to moderate TR. 4. Aorta: The aortic root and ascending aorta are structurally normal, with no evidence of dilitation. There is minimal (Grade I) layered plaque  involving the descending aorta and aortic arch.   Comparison(s): Prior study 01/02/2022: LVEF 60-65%, G1DD, elevated LVEDP, severe LAE, trivial MR, estimated RAP .      Physical Exam:   VS:  BP 122/72   Pulse 65   Ht 5\' 5"  (1.651 m)   Wt 131 lb (59.4 kg)   SpO2 100%   BMI 21.80 kg/m    Wt Readings from Last 3 Encounters:  11/11/23 131 lb (59.4 kg)  11/10/22 127 lb (57.6 kg)  10/12/22 128 lb (58.1 kg)    GEN: Well nourished, well developed in no acute distress NECK: No JVD; No carotid bruits CARDIAC: RRR w/. PAC, no murmurs, rubs, gallops RESPIRATORY:  Clear to auscultation without rales, wheezing or rhonchi  ABDOMEN: Soft, non-tender, non-distended EXTREMITIES:  No edema; No deformity   ASSESSMENT AND PLAN: .   Hypertension -Blood pressure is well-controlled today -Continue current medication regimen which includes amlodipine 10 mg daily, aspirin 81 mg daily, Lipitor 40 mg daily, metoprolol succinate 50 mg daily, Benicar 40 mg, potassium daily  CVA -no residual effects -no therapy indicated   Loop recorder   -still has the loop  -continue remote checks.  Dependent Edema Resolves with leg elevation and no signs of heart failure. Likely due to  prolonged standing or sitting with feet down. -Advise to elevate legs when possible, consider compression stockings, and monitor salt intake.  Hypokalemia -Recent lab work 09/20/2023 revealed potassium 4.6 -Plan to continue potassium supplementation for now with recheck at next visit  Premature Atrial Contractions (PACs) Noted during physical exam. Patient has a loop recorder in place. -Continue monitoring with loop recorder.  Follow-up -Schedule annual follow-up with Dr. Jacinto Halim. -Remove chlorthalidone from medication list as patient is no longer taking it.    Dispo: She can follow-up in 6 months with Dr. Lalla Brothers and in a year with Dr. Jacinto Halim  Signed, Sharlene Dory, PA-C

## 2023-11-18 ENCOUNTER — Ambulatory Visit (INDEPENDENT_AMBULATORY_CARE_PROVIDER_SITE_OTHER): Payer: Medicare Other

## 2023-11-18 DIAGNOSIS — I639 Cerebral infarction, unspecified: Secondary | ICD-10-CM | POA: Diagnosis not present

## 2023-11-22 LAB — CUP PACEART REMOTE DEVICE CHECK
Date Time Interrogation Session: 20241125015600
Implantable Pulse Generator Implant Date: 20230214
Pulse Gen Serial Number: 172346

## 2023-11-23 ENCOUNTER — Other Ambulatory Visit: Payer: Self-pay | Admitting: Cardiology

## 2023-11-23 ENCOUNTER — Telehealth: Payer: Self-pay | Admitting: Cardiology

## 2023-11-23 DIAGNOSIS — I10 Essential (primary) hypertension: Secondary | ICD-10-CM

## 2023-11-23 NOTE — Telephone Encounter (Signed)
*  STAT* If patient is at the pharmacy, call can be transferred to refill team.   1. Which medications need to be refilled? (please list name of each medication and dose if known)   amLODipine (NORVASC) 10 MG tablet (Expired)   2. Which pharmacy/location (including street and city if local pharmacy) is medication to be sent to? [facility] - [place], [place] - [address] Phone: [phone]  Fax: [phone]     3. Do they need a 30 day or 90 day supply? 90 They are sending script to [facility] instead of [facility]

## 2023-11-24 ENCOUNTER — Other Ambulatory Visit: Payer: Self-pay

## 2023-11-24 DIAGNOSIS — I10 Essential (primary) hypertension: Secondary | ICD-10-CM

## 2023-11-24 MED ORDER — AMLODIPINE BESYLATE 10 MG PO TABS: 10.0000 mg | ORAL_TABLET | Freq: Every morning | ORAL | 3 refills | Status: DC

## 2023-11-24 NOTE — Telephone Encounter (Signed)
RX sent to requested Pharmacy

## 2023-12-14 NOTE — Addendum Note (Signed)
Addended by: Elease Etienne A on: 12/14/2023 02:54 PM   Modules accepted: Orders

## 2023-12-14 NOTE — Progress Notes (Signed)
Carelink Summary Report / Loop Recorder 

## 2023-12-20 ENCOUNTER — Ambulatory Visit: Payer: Medicare Other

## 2023-12-20 DIAGNOSIS — I639 Cerebral infarction, unspecified: Secondary | ICD-10-CM | POA: Diagnosis not present

## 2023-12-20 LAB — CUP PACEART REMOTE DEVICE CHECK
Date Time Interrogation Session: 20241223010000
Implantable Pulse Generator Implant Date: 20230214
Pulse Gen Serial Number: 172346

## 2024-01-20 ENCOUNTER — Ambulatory Visit: Payer: Medicare Other

## 2024-01-20 ENCOUNTER — Encounter: Payer: Self-pay | Admitting: Cardiology

## 2024-01-20 DIAGNOSIS — I639 Cerebral infarction, unspecified: Secondary | ICD-10-CM

## 2024-01-20 LAB — CUP PACEART REMOTE DEVICE CHECK
Date Time Interrogation Session: 20250123010600
Implantable Pulse Generator Implant Date: 20230214
Pulse Gen Serial Number: 172346

## 2024-01-27 NOTE — Progress Notes (Signed)
Bsx Loop Recorder

## 2024-02-11 DIAGNOSIS — E785 Hyperlipidemia, unspecified: Secondary | ICD-10-CM | POA: Diagnosis not present

## 2024-02-11 DIAGNOSIS — N1831 Chronic kidney disease, stage 3a: Secondary | ICD-10-CM | POA: Diagnosis not present

## 2024-02-11 DIAGNOSIS — I129 Hypertensive chronic kidney disease with stage 1 through stage 4 chronic kidney disease, or unspecified chronic kidney disease: Secondary | ICD-10-CM | POA: Diagnosis not present

## 2024-02-11 DIAGNOSIS — E890 Postprocedural hypothyroidism: Secondary | ICD-10-CM | POA: Diagnosis not present

## 2024-02-21 ENCOUNTER — Ambulatory Visit (INDEPENDENT_AMBULATORY_CARE_PROVIDER_SITE_OTHER): Payer: Medicare Other

## 2024-02-21 DIAGNOSIS — I639 Cerebral infarction, unspecified: Secondary | ICD-10-CM | POA: Diagnosis not present

## 2024-02-21 LAB — CUP PACEART REMOTE DEVICE CHECK
Date Time Interrogation Session: 20250224021100
Implantable Pulse Generator Implant Date: 20230214
Pulse Gen Serial Number: 172346

## 2024-02-22 ENCOUNTER — Encounter: Payer: Self-pay | Admitting: Cardiology

## 2024-03-02 NOTE — Progress Notes (Signed)
 [facility] Summary Report / Loop Recorder

## 2024-03-08 DIAGNOSIS — E890 Postprocedural hypothyroidism: Secondary | ICD-10-CM | POA: Diagnosis not present

## 2024-03-08 DIAGNOSIS — E785 Hyperlipidemia, unspecified: Secondary | ICD-10-CM | POA: Diagnosis not present

## 2024-03-08 DIAGNOSIS — K900 Celiac disease: Secondary | ICD-10-CM | POA: Diagnosis not present

## 2024-03-08 DIAGNOSIS — I10 Essential (primary) hypertension: Secondary | ICD-10-CM | POA: Diagnosis not present

## 2024-03-08 DIAGNOSIS — M109 Gout, unspecified: Secondary | ICD-10-CM | POA: Diagnosis not present

## 2024-03-08 DIAGNOSIS — I1310 Hypertensive heart and chronic kidney disease without heart failure, with stage 1 through stage 4 chronic kidney disease, or unspecified chronic kidney disease: Secondary | ICD-10-CM | POA: Diagnosis not present

## 2024-03-08 DIAGNOSIS — N1831 Chronic kidney disease, stage 3a: Secondary | ICD-10-CM | POA: Diagnosis not present

## 2024-03-08 DIAGNOSIS — G43009 Migraine without aura, not intractable, without status migrainosus: Secondary | ICD-10-CM | POA: Diagnosis not present

## 2024-03-08 DIAGNOSIS — I499 Cardiac arrhythmia, unspecified: Secondary | ICD-10-CM | POA: Diagnosis not present

## 2024-03-08 DIAGNOSIS — I669 Occlusion and stenosis of unspecified cerebral artery: Secondary | ICD-10-CM | POA: Diagnosis not present

## 2024-03-08 DIAGNOSIS — G319 Degenerative disease of nervous system, unspecified: Secondary | ICD-10-CM | POA: Diagnosis not present

## 2024-03-08 DIAGNOSIS — K219 Gastro-esophageal reflux disease without esophagitis: Secondary | ICD-10-CM | POA: Diagnosis not present

## 2024-03-08 DIAGNOSIS — J45909 Unspecified asthma, uncomplicated: Secondary | ICD-10-CM | POA: Diagnosis not present

## 2024-03-16 DIAGNOSIS — Z6823 Body mass index (BMI) 23.0-23.9, adult: Secondary | ICD-10-CM | POA: Diagnosis not present

## 2024-03-16 DIAGNOSIS — Z79899 Other long term (current) drug therapy: Secondary | ICD-10-CM | POA: Diagnosis not present

## 2024-03-16 DIAGNOSIS — M1A9XX1 Chronic gout, unspecified, with tophus (tophi): Secondary | ICD-10-CM | POA: Diagnosis not present

## 2024-03-16 DIAGNOSIS — M1991 Primary osteoarthritis, unspecified site: Secondary | ICD-10-CM | POA: Diagnosis not present

## 2024-03-16 DIAGNOSIS — R768 Other specified abnormal immunological findings in serum: Secondary | ICD-10-CM | POA: Diagnosis not present

## 2024-03-22 DIAGNOSIS — Z79899 Other long term (current) drug therapy: Secondary | ICD-10-CM | POA: Diagnosis not present

## 2024-03-22 DIAGNOSIS — M1A9XX1 Chronic gout, unspecified, with tophus (tophi): Secondary | ICD-10-CM | POA: Diagnosis not present

## 2024-03-23 ENCOUNTER — Ambulatory Visit (INDEPENDENT_AMBULATORY_CARE_PROVIDER_SITE_OTHER): Payer: Medicare Other

## 2024-03-23 DIAGNOSIS — I639 Cerebral infarction, unspecified: Secondary | ICD-10-CM | POA: Diagnosis not present

## 2024-03-23 LAB — CUP PACEART REMOTE DEVICE CHECK
Date Time Interrogation Session: 20250327001700
Implantable Pulse Generator Implant Date: 20230214
Pulse Gen Serial Number: 172346

## 2024-03-25 ENCOUNTER — Encounter: Payer: Self-pay | Admitting: Cardiology

## 2024-03-27 DIAGNOSIS — M7918 Myalgia, other site: Secondary | ICD-10-CM | POA: Diagnosis not present

## 2024-03-27 DIAGNOSIS — W19XXXA Unspecified fall, initial encounter: Secondary | ICD-10-CM | POA: Diagnosis not present

## 2024-03-27 DIAGNOSIS — M47816 Spondylosis without myelopathy or radiculopathy, lumbar region: Secondary | ICD-10-CM | POA: Diagnosis not present

## 2024-03-29 NOTE — Progress Notes (Signed)
 Bsx Loop Recorder

## 2024-04-02 ENCOUNTER — Emergency Department (HOSPITAL_BASED_OUTPATIENT_CLINIC_OR_DEPARTMENT_OTHER): Admitting: Radiology

## 2024-04-02 ENCOUNTER — Emergency Department (HOSPITAL_BASED_OUTPATIENT_CLINIC_OR_DEPARTMENT_OTHER)
Admission: EM | Admit: 2024-04-02 | Discharge: 2024-04-02 | Disposition: A | Discharge status: Home / Self Care | Attending: Emergency Medicine | Admitting: Emergency Medicine

## 2024-04-02 ENCOUNTER — Encounter (HOSPITAL_BASED_OUTPATIENT_CLINIC_OR_DEPARTMENT_OTHER): Payer: Self-pay | Admitting: Emergency Medicine

## 2024-04-02 ENCOUNTER — Emergency Department (HOSPITAL_BASED_OUTPATIENT_CLINIC_OR_DEPARTMENT_OTHER)

## 2024-04-02 ENCOUNTER — Other Ambulatory Visit: Payer: Self-pay

## 2024-04-02 DIAGNOSIS — S3992XA Unspecified injury of lower back, initial encounter: Secondary | ICD-10-CM | POA: Diagnosis present

## 2024-04-02 DIAGNOSIS — I10 Essential (primary) hypertension: Secondary | ICD-10-CM | POA: Insufficient documentation

## 2024-04-02 DIAGNOSIS — Z8673 Personal history of transient ischemic attack (TIA), and cerebral infarction without residual deficits: Secondary | ICD-10-CM | POA: Insufficient documentation

## 2024-04-02 DIAGNOSIS — Z7982 Long term (current) use of aspirin: Secondary | ICD-10-CM | POA: Diagnosis not present

## 2024-04-02 DIAGNOSIS — J45909 Unspecified asthma, uncomplicated: Secondary | ICD-10-CM | POA: Insufficient documentation

## 2024-04-02 DIAGNOSIS — S72002A Fracture of unspecified part of neck of left femur, initial encounter for closed fracture: Secondary | ICD-10-CM | POA: Diagnosis not present

## 2024-04-02 DIAGNOSIS — M25552 Pain in left hip: Secondary | ICD-10-CM | POA: Diagnosis not present

## 2024-04-02 DIAGNOSIS — Z79899 Other long term (current) drug therapy: Secondary | ICD-10-CM | POA: Diagnosis not present

## 2024-04-02 DIAGNOSIS — S3210XA Unspecified fracture of sacrum, initial encounter for closed fracture: Secondary | ICD-10-CM | POA: Insufficient documentation

## 2024-04-02 DIAGNOSIS — M25551 Pain in right hip: Secondary | ICD-10-CM | POA: Diagnosis not present

## 2024-04-02 DIAGNOSIS — M47816 Spondylosis without myelopathy or radiculopathy, lumbar region: Secondary | ICD-10-CM | POA: Diagnosis not present

## 2024-04-02 DIAGNOSIS — W1839XA Other fall on same level, initial encounter: Secondary | ICD-10-CM | POA: Insufficient documentation

## 2024-04-02 DIAGNOSIS — M160 Bilateral primary osteoarthritis of hip: Secondary | ICD-10-CM | POA: Diagnosis not present

## 2024-04-02 DIAGNOSIS — Z91040 Latex allergy status: Secondary | ICD-10-CM | POA: Diagnosis not present

## 2024-04-02 MED ORDER — HYDROCODONE-ACETAMINOPHEN 5-325 MG PO TABS: 1.0000 | ORAL_TABLET | Freq: Four times a day (QID) | ORAL | 0 refills | Status: DC | PRN

## 2024-04-02 MED ORDER — OXYCODONE HCL 5 MG PO TABS
5.0000 mg | ORAL_TABLET | Freq: Once | ORAL | Status: AC
  Administered 2024-04-02: 5 mg via ORAL
  Filled 2024-04-02: qty 1

## 2024-04-02 NOTE — ED Provider Notes (Signed)
 [place] EMERGENCY DEPARTMENT AT [facility] Provider Note   CSN: [id number] Arrival date & time: 04/02/24  0944     History  Chief Complaint  Patient presents with   Fall   HPI Felicia Washington is a 78 y.o. female with history of Graves' disease, anemia, asthma, hypertension, stroke presenting for fall.  Occurred 1.5 weeks ago.  Patient states that she had "lots of groceries" in her hands and she was attempting to walk up the 1 step into her house when she lost her balance and fell backwards landing on her buttock.  Her daughter reports that they did an "x-ray of her back but not her hips".  Patient states that the pain is more in both hips, the left is worse than the right.  Denies any pain about the midline of the lower back.  She is now having difficulty ambulating per her daughter as well.  And her daughters noticed that she is favoring the left leg.  She denies saddle anesthesia and urinary or bowel changes.  Reports that she takes a daily aspirin but no other blood thinners.  Took a Tylenol just before she arrived.   Fall       Home Medications Prior to Admission medications   Medication Sig Start Date End Date Taking? Authorizing Provider  amLODipine (NORVASC) 10 MG tablet Take 1 tablet (10 mg total) by mouth every morning. 11/24/23 11/18/24  Yates Decamp, MD  aspirin EC 81 MG tablet Take 81 mg by mouth in the morning. Swallow whole.    [provider]  atorvastatin (LIPITOR) 40 MG tablet Take 40 mg by mouth in the morning.    [provider]  BREO ELLIPTA 100-25 MCG/ACT AEPB Inhale 1 puff into the lungs daily.    [provider]  buPROPion (WELLBUTRIN XL) 150 MG 24 hr tablet Take 150 mg by mouth in the morning.    [provider]  clonazePAM (KLONOPIN) 0.5 MG tablet Take 0.5 mg by mouth daily as needed for anxiety. 02/04/22   [provider]  famotidine (PEPCID) 20 MG tablet Take 20 mg by mouth every evening.    [provider]  Febuxostat 80 MG TABS Take 80 mg by mouth every evening.    [provider]  fenofibrate micronized (LOFIBRA) 67 MG capsule Take 67 mg by mouth in the morning. 08/08/18   [provider]  HYDROcodone-acetaminophen (NORCO/VICODIN) 5-325 MG tablet Take 1 tablet by mouth every 6 (six) hours as needed for up to 8 doses for severe pain (pain score 7-10). 04/02/24  Yes Gareth Eagle, PA-C  levothyroxine (SYNTHROID, LEVOTHROID) 112 MCG tablet Take 56-224 mcg by mouth See admin instructions. Take 2 tablets (224 mcg) by mouth on Mondays through Saturdays before breakfast. Take 0.5 tablet (56 mcg) by mouth on Sundays before breakfast.    [provider]  metoprolol succinate (TOPROL XL) 50 MG 24 hr tablet Take 1 tablet (50 mg total) by mouth at bedtime. 11/11/23   Sharlene Dory, PA-C  montelukast (SINGULAIR) 10 MG tablet Take 10 mg by mouth at bedtime.    [provider]  olmesartan (BENICAR) 40 MG tablet Take 1 tablet (40 mg total) by mouth every evening. 11/11/23   Sharlene Dory, PA-C  potassium chloride (KLOR-CON) 10 MEQ tablet Take 1 tablet (10 mEq total) by mouth every morning. 11/10/22 11/05/23  Yates Decamp, MD      Allergies    Adhesive [tape], Allopurinol, Codeine, and Latex  Review of Systems   See HPI  Physical Exam Updated Vital Signs BP 125/66 (BP Location: Right Arm)   Pulse 72   Temp 97.6 F (36.4 C) (Oral)   Resp 16   Ht 5\' 5"  (1.651 m)   Wt 59.4 kg   SpO2 100%   BMI 21.80 kg/m  Physical Exam Constitutional:      Appearance: Normal appearance.  HENT:     Head: Normocephalic.     Nose: Nose normal.  Eyes:     Conjunctiva/sclera: Conjunctivae normal.  Pulmonary:     Effort: Pulmonary effort is normal.  Musculoskeletal:     Right hip: No deformity, tenderness or bony tenderness. Normal range of motion.     Left hip: Tenderness present. No deformity. Decreased range of motion.     Comments: Reluctant to actively flex or  extend the leg or rotate at the hip due to pain.  Passive range of motion of the left hip was relatively normal did elicit some tenderness with internal/external rotation of the hip.  Pedal pulses are 2+ bilaterally.  Mild 1+ nonpitting edema noted in both feet.  No midline tenderness with palpation to the lower back.  Lower back appears atraumatic.  Neurological:     Mental Status: She is alert.  Psychiatric:        Mood and Affect: Mood normal.     ED Results / Procedures / Treatments   Labs (all labs ordered are listed, but only abnormal results are displayed) Labs Reviewed - No data to display  EKG None  Radiology CT Hip Left Wo Contrast Result Date: 04/02/2024 CLINICAL DATA:  Pain after fall. EXAM: CT OF THE LEFT HIP WITHOUT CONTRAST TECHNIQUE: Multidetector CT imaging of the left hip was performed according to the standard protocol. Multiplanar CT image reconstructions were also generated. RADIATION DOSE REDUCTION: This exam was performed according to the departmental dose-optimization program which includes automated exposure control, adjustment of the mA and/or kV according to patient size and/or use of iterative reconstruction technique. COMPARISON:  Same day radiographs of the bilateral hips dated 04/02/2024 at 10:24 a.m. FINDINGS: Bones/Joint/Cartilage There is a nondisplaced oblique fracture of the left sacrum at the S2-S3 level with fracture margins extending through the inferior aspect of the left S2 and S3 neural foramina (series 6, images 20 5-29). The sacroiliac joints are intact. No evidence of diastasis. The innominate bones and bilateral proximal femora are intact. No additional fracture identified. Degenerative changes of the pubic symphysis. Mild degenerative changes of the bilateral hips. Ligaments Ligaments are suboptimally evaluated by CT. Muscles and Tendons No intramuscular fluid collection or hematoma. Soft tissue No fluid collection or hematoma. IMPRESSION:  Nondisplaced fracture of the left sacrum at the S2-S3 level with fracture margins extending through the inferior aspect of the left S2 and S3 neural foramina. The sacroiliac joints are intact. No evidence of diastasis. Electronically Signed   By: Hart Robinsons M.D.   On: 04/02/2024 11:10   DG HIPS BILAT WITH PELVIS 3-4 VIEWS Result Date: 04/02/2024 CLINICAL DATA:  Pain after fall. EXAM: DG HIP (WITH OR WITHOUT PELVIS) 3-4V BILAT COMPARISON:  None Available. FINDINGS: There is no evidence of acute hip fracture or dislocation. Sacroiliac joints and pubic symphysis are anatomically aligned mild degenerative changes of the bilateral hips. Degenerative changes of the visualized lower lumbar spine. IMPRESSION: No acute osseous abnormality. Electronically Signed   By: Hart Robinsons M.D.   On: 04/02/2024 10:35    Procedures Procedures  Medications Ordered in ED Medications  oxyCODONE (Oxy IR/ROXICODONE) immediate release tablet 5 mg (5 mg Oral Given 04/02/24 1023)    ED Course/ Medical Decision Making/ A&P                                 Medical Decision Making Amount and/or Complexity of Data Reviewed Radiology: ordered.  Risk Prescription drug management.   78 year old well-appearing female presenting for a fall that occurred 1.5 weeks ago.  Exam was notable for generalized pain and tenderness to reduced active range of motion of the left hip primarily.  Initial x-rays were negative.  But given the nature and persistence of her pain, felt it prompted further workup with a CT.  The CT of her left hip revealed a left-sided sacral fracture at the S2-S3 level.  See more details above.  After oxycodone, patient stated that pain had improved. Advised supportive treatment primarily at home with PCP follow-up.  Sent a few tablets of Norco to her pharmacy for acute pain.  Discussed return precautions.  Discharged in good condition.         Final Clinical Impression(s) / ED Diagnoses Final  diagnoses:  Closed fracture of sacrum, unspecified portion of sacrum, initial encounter [facility])    Rx / DC Orders ED Discharge Orders          Ordered    HYDROcodone-acetaminophen (NORCO/VICODIN) 5-325 MG tablet  Every 6 hours PRN        04/02/24 1140              Gareth Eagle, PA-C 04/02/24 1145    Jacalyn Lefevre, MD 04/02/24 1535

## 2024-04-02 NOTE — Discharge Instructions (Addendum)
 Evaluation today revealed that you have a sacral fracture.  This is likely the cause of your symptoms.  The treatment is mostly supportive.  Recommend Tylenol and applying ice 3-4 times a day.  Also recommend that you follow-up with your PCP as you may need physical therapy or further management in the future.  I sent a few tablets of Norco for acute pain to your pharmacy.  If you develop any numbness in your saddle area, urinary or bowel incontinence or retention, develop a fever or any other concerning symptom please return to the emergency department further evaluation.

## 2024-04-02 NOTE — ED Notes (Signed)
 Discharge paperwork given and verbally understood.

## 2024-04-02 NOTE — ED Triage Notes (Addendum)
 Pt caox4 reporting fall 1.5 wks ago c/o L lower back and L buttock pain on ROM and weight bearing. Pt reports she was seen by PCP and had XR done which was neg but that the pain hasn't improved. Pt denies numbness/tingling/weakness in extremities.

## 2024-04-14 DIAGNOSIS — S32110A Nondisplaced Zone I fracture of sacrum, initial encounter for closed fracture: Secondary | ICD-10-CM | POA: Diagnosis not present

## 2024-04-14 DIAGNOSIS — M25552 Pain in left hip: Secondary | ICD-10-CM | POA: Diagnosis not present

## 2024-04-24 ENCOUNTER — Ambulatory Visit (INDEPENDENT_AMBULATORY_CARE_PROVIDER_SITE_OTHER): Payer: Medicare Other

## 2024-04-24 DIAGNOSIS — I639 Cerebral infarction, unspecified: Secondary | ICD-10-CM | POA: Diagnosis not present

## 2024-04-24 LAB — CUP PACEART REMOTE DEVICE CHECK
Date Time Interrogation Session: 20250428002300
Implantable Pulse Generator Implant Date: 20230214
Pulse Gen Serial Number: 172346

## 2024-04-25 DIAGNOSIS — S32110D Nondisplaced Zone I fracture of sacrum, subsequent encounter for fracture with routine healing: Secondary | ICD-10-CM | POA: Diagnosis not present

## 2024-04-25 DIAGNOSIS — M5416 Radiculopathy, lumbar region: Secondary | ICD-10-CM | POA: Diagnosis not present

## 2024-04-28 ENCOUNTER — Encounter: Payer: Self-pay | Admitting: Cardiology

## 2024-05-03 NOTE — Progress Notes (Signed)
 [facility] Summary Report / Loop Recorder

## 2024-05-25 ENCOUNTER — Ambulatory Visit: Payer: Medicare Other

## 2024-05-25 DIAGNOSIS — I639 Cerebral infarction, unspecified: Secondary | ICD-10-CM | POA: Diagnosis not present

## 2024-05-25 LAB — CUP PACEART REMOTE DEVICE CHECK
Date Time Interrogation Session: 20250529002700
Implantable Pulse Generator Implant Date: 20230214
Pulse Gen Serial Number: 172346

## 2024-05-28 ENCOUNTER — Ambulatory Visit: Payer: Self-pay | Admitting: Cardiology

## 2024-06-02 DIAGNOSIS — E890 Postprocedural hypothyroidism: Secondary | ICD-10-CM | POA: Diagnosis not present

## 2024-06-02 DIAGNOSIS — R600 Localized edema: Secondary | ICD-10-CM | POA: Diagnosis not present

## 2024-06-02 DIAGNOSIS — N1831 Chronic kidney disease, stage 3a: Secondary | ICD-10-CM | POA: Diagnosis not present

## 2024-06-02 DIAGNOSIS — I8310 Varicose veins of unspecified lower extremity with inflammation: Secondary | ICD-10-CM | POA: Diagnosis not present

## 2024-06-02 DIAGNOSIS — I669 Occlusion and stenosis of unspecified cerebral artery: Secondary | ICD-10-CM | POA: Diagnosis not present

## 2024-06-12 DIAGNOSIS — I10 Essential (primary) hypertension: Secondary | ICD-10-CM | POA: Diagnosis not present

## 2024-06-12 DIAGNOSIS — Z79899 Other long term (current) drug therapy: Secondary | ICD-10-CM | POA: Diagnosis not present

## 2024-06-14 NOTE — Progress Notes (Signed)
 Bsx Loop Recorder

## 2024-06-20 ENCOUNTER — Ambulatory Visit: Payer: Self-pay

## 2024-06-20 NOTE — Telephone Encounter (Signed)
 FYI Only or Action Required?: FYI only for provider.  Patient was last seen in primary care on N/A. Called Nurse Triage reporting Shortness of Breath. Symptoms began several weeks ago. Interventions attempted: Nothing. Symptoms are: gradually worsening.  Triage Disposition: See HCP Within 4 Hours (Or PCP Triage)  Patient/caregiver understands and will follow disposition?: Unsure     Copied from CRM [id number]. Topic: Clinical - Red Word Triage >> Jun 20, 2024  8:39 AM Felicia Washington wrote: Red Word that prompted transfer to Nurse Triage: SOB, can't get a full breathe.  Has to start and stop if going any distance. Reason for Disposition  [1] MILD difficulty breathing (e.g., minimal/no SOB at rest, SOB with walking, pulse <100) AND [2] NEW-onset or WORSE than normal  Answer Assessment - Initial Assessment Questions 1. RESPIRATORY STATUS: Describe your breathing? (e.g., wheezing, shortness of breath, unable to speak, severe coughing)      SOB - I just feel like I can't take a full breath 2. ONSET: When did this breathing problem begin?      A few weeks Recent fall 3. PATTERN Does the difficult breathing come and go, or has it been constant since it started?      Constant - when outside 4. SEVERITY: How bad is your breathing? (e.g., mild, moderate, severe)    - MILD: No SOB at rest, mild SOB with walking, speaks normally in sentences, can lie down, no retractions, pulse < 100.    - MODERATE: SOB at rest, SOB with minimal exertion and prefers to sit, cannot lie down flat, speaks in phrases, mild retractions, audible wheezing, pulse 100-120.    - SEVERE: Very SOB at rest, speaks in single words, struggling to breathe, sitting hunched forward, retractions, pulse > 120  Triager does appreciate audible SOB/wheezing during call. Pt is speaking in partial sentences.   5. RECURRENT SYMPTOM: Have you had difficulty breathing before? If Yes, ask: When was the last time? and What happened that  time?      Yes, years ago 6. CARDIAC HISTORY: Do you have any history of heart disease? (e.g., heart attack, angina, bypass surgery, angioplasty)      denies 7. LUNG HISTORY: Do you have any history of lung disease?  (e.g., pulmonary embolus, asthma, emphysema)     asthma 8. CAUSE: What do you think is causing the breathing problem?      asthma 9. OTHER SYMPTOMS: Do you have any other symptoms? (e.g., dizziness, runny nose, cough, chest pain, fever)     denies 10. O2 SATURATION MONITOR:  Do you use an oxygen  saturation monitor (pulse oximeter) at home? If Yes, ask: What is your reading (oxygen  level) today? What is your usual oxygen  saturation reading? (e.g., 95%)       N/a 11. PREGNANCY: Is there any chance you are pregnant? When was your last menstrual period?       N/a 12. TRAVEL: Have you traveled out of the country in the last month? (e.g., travel history, exposures)       N/a  Protocols used: Breathing Difficulty-A-AH

## 2024-06-20 NOTE — Telephone Encounter (Signed)
 Additional Information . Commented on: Answer Assessment    * Answer assessment clarification: Mild-moderate SOB -- pt states SOB when outside  Protocols used: Breathing Difficulty-A-AH

## 2024-06-26 ENCOUNTER — Ambulatory Visit

## 2024-06-26 DIAGNOSIS — I639 Cerebral infarction, unspecified: Secondary | ICD-10-CM | POA: Diagnosis not present

## 2024-06-26 LAB — CUP PACEART REMOTE DEVICE CHECK
Date Time Interrogation Session: 20250630003200
Implantable Pulse Generator Implant Date: 20230214
Pulse Gen Serial Number: 172346

## 2024-06-28 ENCOUNTER — Ambulatory Visit: Payer: Self-pay | Admitting: Cardiology

## 2024-07-10 DIAGNOSIS — N1831 Chronic kidney disease, stage 3a: Secondary | ICD-10-CM | POA: Diagnosis not present

## 2024-07-10 DIAGNOSIS — G43009 Migraine without aura, not intractable, without status migrainosus: Secondary | ICD-10-CM | POA: Diagnosis not present

## 2024-07-10 DIAGNOSIS — I499 Cardiac arrhythmia, unspecified: Secondary | ICD-10-CM | POA: Diagnosis not present

## 2024-07-10 DIAGNOSIS — J45909 Unspecified asthma, uncomplicated: Secondary | ICD-10-CM | POA: Diagnosis not present

## 2024-07-10 DIAGNOSIS — I129 Hypertensive chronic kidney disease with stage 1 through stage 4 chronic kidney disease, or unspecified chronic kidney disease: Secondary | ICD-10-CM | POA: Diagnosis not present

## 2024-07-10 DIAGNOSIS — G319 Degenerative disease of nervous system, unspecified: Secondary | ICD-10-CM | POA: Diagnosis not present

## 2024-07-10 DIAGNOSIS — E890 Postprocedural hypothyroidism: Secondary | ICD-10-CM | POA: Diagnosis not present

## 2024-07-10 DIAGNOSIS — I669 Occlusion and stenosis of unspecified cerebral artery: Secondary | ICD-10-CM | POA: Diagnosis not present

## 2024-07-10 DIAGNOSIS — K219 Gastro-esophageal reflux disease without esophagitis: Secondary | ICD-10-CM | POA: Diagnosis not present

## 2024-07-10 DIAGNOSIS — I8310 Varicose veins of unspecified lower extremity with inflammation: Secondary | ICD-10-CM | POA: Diagnosis not present

## 2024-07-10 DIAGNOSIS — E785 Hyperlipidemia, unspecified: Secondary | ICD-10-CM | POA: Diagnosis not present

## 2024-07-10 DIAGNOSIS — M109 Gout, unspecified: Secondary | ICD-10-CM | POA: Diagnosis not present

## 2024-07-14 NOTE — Progress Notes (Signed)
 [facility] Summary Report / Loop Recorder

## 2024-07-27 ENCOUNTER — Ambulatory Visit (INDEPENDENT_AMBULATORY_CARE_PROVIDER_SITE_OTHER)

## 2024-07-27 ENCOUNTER — Ambulatory Visit: Admitting: Internal Medicine

## 2024-07-27 DIAGNOSIS — I639 Cerebral infarction, unspecified: Secondary | ICD-10-CM | POA: Diagnosis not present

## 2024-07-28 DIAGNOSIS — I129 Hypertensive chronic kidney disease with stage 1 through stage 4 chronic kidney disease, or unspecified chronic kidney disease: Secondary | ICD-10-CM | POA: Diagnosis not present

## 2024-07-28 DIAGNOSIS — N1831 Chronic kidney disease, stage 3a: Secondary | ICD-10-CM | POA: Diagnosis not present

## 2024-07-28 DIAGNOSIS — E890 Postprocedural hypothyroidism: Secondary | ICD-10-CM | POA: Diagnosis not present

## 2024-07-31 ENCOUNTER — Ambulatory Visit: Payer: Self-pay | Admitting: Cardiology

## 2024-07-31 LAB — CUP PACEART REMOTE DEVICE CHECK
Date Time Interrogation Session: 20250804003800
Implantable Pulse Generator Implant Date: 20230214
Pulse Gen Serial Number: 172346

## 2024-08-01 DIAGNOSIS — Z1231 Encounter for screening mammogram for malignant neoplasm of breast: Secondary | ICD-10-CM | POA: Diagnosis not present

## 2024-08-18 ENCOUNTER — Other Ambulatory Visit: Payer: Self-pay

## 2024-08-18 ENCOUNTER — Emergency Department (HOSPITAL_COMMUNITY)

## 2024-08-18 ENCOUNTER — Inpatient Hospital Stay (HOSPITAL_COMMUNITY)
Admission: EM | Admit: 2024-08-18 | Discharge: 2024-08-23 | DRG: 327 | Disposition: A | Discharge status: Home / Self Care | Attending: Internal Medicine | Admitting: Internal Medicine

## 2024-08-18 ENCOUNTER — Encounter (HOSPITAL_COMMUNITY): Payer: Self-pay | Admitting: Emergency Medicine

## 2024-08-18 DIAGNOSIS — E7800 Pure hypercholesterolemia, unspecified: Secondary | ICD-10-CM | POA: Diagnosis not present

## 2024-08-18 DIAGNOSIS — E890 Postprocedural hypothyroidism: Secondary | ICD-10-CM | POA: Diagnosis present

## 2024-08-18 DIAGNOSIS — Z91048 Other nonmedicinal substance allergy status: Secondary | ICD-10-CM | POA: Diagnosis not present

## 2024-08-18 DIAGNOSIS — R0602 Shortness of breath: Secondary | ICD-10-CM | POA: Diagnosis not present

## 2024-08-18 DIAGNOSIS — Z91040 Latex allergy status: Secondary | ICD-10-CM | POA: Diagnosis not present

## 2024-08-18 DIAGNOSIS — Z79890 Hormone replacement therapy (postmenopausal): Secondary | ICD-10-CM | POA: Diagnosis not present

## 2024-08-18 DIAGNOSIS — Z888 Allergy status to other drugs, medicaments and biological substances status: Secondary | ICD-10-CM | POA: Diagnosis not present

## 2024-08-18 DIAGNOSIS — K440 Diaphragmatic hernia with obstruction, without gangrene: Secondary | ICD-10-CM | POA: Diagnosis not present

## 2024-08-18 DIAGNOSIS — F32A Depression, unspecified: Secondary | ICD-10-CM | POA: Diagnosis present

## 2024-08-18 DIAGNOSIS — R1013 Epigastric pain: Secondary | ICD-10-CM | POA: Diagnosis not present

## 2024-08-18 DIAGNOSIS — Z96611 Presence of right artificial shoulder joint: Secondary | ICD-10-CM | POA: Diagnosis not present

## 2024-08-18 DIAGNOSIS — R1084 Generalized abdominal pain: Secondary | ICD-10-CM | POA: Diagnosis not present

## 2024-08-18 DIAGNOSIS — Z885 Allergy status to narcotic agent status: Secondary | ICD-10-CM | POA: Diagnosis not present

## 2024-08-18 DIAGNOSIS — J45909 Unspecified asthma, uncomplicated: Secondary | ICD-10-CM | POA: Diagnosis not present

## 2024-08-18 DIAGNOSIS — I129 Hypertensive chronic kidney disease with stage 1 through stage 4 chronic kidney disease, or unspecified chronic kidney disease: Secondary | ICD-10-CM | POA: Diagnosis not present

## 2024-08-18 DIAGNOSIS — R531 Weakness: Secondary | ICD-10-CM | POA: Diagnosis not present

## 2024-08-18 DIAGNOSIS — R748 Abnormal levels of other serum enzymes: Secondary | ICD-10-CM | POA: Diagnosis present

## 2024-08-18 DIAGNOSIS — Z4682 Encounter for fitting and adjustment of non-vascular catheter: Secondary | ICD-10-CM | POA: Diagnosis not present

## 2024-08-18 DIAGNOSIS — Z807 Family history of other malignant neoplasms of lymphoid, hematopoietic and related tissues: Secondary | ICD-10-CM | POA: Diagnosis not present

## 2024-08-18 DIAGNOSIS — Z79899 Other long term (current) drug therapy: Secondary | ICD-10-CM | POA: Diagnosis not present

## 2024-08-18 DIAGNOSIS — Z8349 Family history of other endocrine, nutritional and metabolic diseases: Secondary | ICD-10-CM | POA: Diagnosis not present

## 2024-08-18 DIAGNOSIS — E039 Hypothyroidism, unspecified: Secondary | ICD-10-CM | POA: Diagnosis not present

## 2024-08-18 DIAGNOSIS — Z833 Family history of diabetes mellitus: Secondary | ICD-10-CM | POA: Diagnosis not present

## 2024-08-18 DIAGNOSIS — F419 Anxiety disorder, unspecified: Secondary | ICD-10-CM | POA: Diagnosis present

## 2024-08-18 DIAGNOSIS — I10 Essential (primary) hypertension: Secondary | ICD-10-CM | POA: Diagnosis not present

## 2024-08-18 DIAGNOSIS — K449 Diaphragmatic hernia without obstruction or gangrene: Principal | ICD-10-CM

## 2024-08-18 DIAGNOSIS — K219 Gastro-esophageal reflux disease without esophagitis: Secondary | ICD-10-CM | POA: Diagnosis present

## 2024-08-18 DIAGNOSIS — Z8379 Family history of other diseases of the digestive system: Secondary | ICD-10-CM | POA: Diagnosis not present

## 2024-08-18 DIAGNOSIS — Z8673 Personal history of transient ischemic attack (TIA), and cerebral infarction without residual deficits: Secondary | ICD-10-CM

## 2024-08-18 DIAGNOSIS — Z8249 Family history of ischemic heart disease and other diseases of the circulatory system: Secondary | ICD-10-CM | POA: Diagnosis not present

## 2024-08-18 DIAGNOSIS — I517 Cardiomegaly: Secondary | ICD-10-CM | POA: Diagnosis not present

## 2024-08-18 DIAGNOSIS — K5730 Diverticulosis of large intestine without perforation or abscess without bleeding: Secondary | ICD-10-CM | POA: Diagnosis not present

## 2024-08-18 DIAGNOSIS — N281 Cyst of kidney, acquired: Secondary | ICD-10-CM | POA: Diagnosis not present

## 2024-08-18 DIAGNOSIS — K900 Celiac disease: Secondary | ICD-10-CM | POA: Diagnosis not present

## 2024-08-18 DIAGNOSIS — N189 Chronic kidney disease, unspecified: Secondary | ICD-10-CM | POA: Diagnosis not present

## 2024-08-18 DIAGNOSIS — Z83719 Family history of colon polyps, unspecified: Secondary | ICD-10-CM | POA: Diagnosis not present

## 2024-08-18 DIAGNOSIS — Z8701 Personal history of pneumonia (recurrent): Secondary | ICD-10-CM

## 2024-08-18 DIAGNOSIS — K8020 Calculus of gallbladder without cholecystitis without obstruction: Secondary | ICD-10-CM | POA: Diagnosis not present

## 2024-08-18 DIAGNOSIS — R1111 Vomiting without nausea: Secondary | ICD-10-CM | POA: Diagnosis not present

## 2024-08-18 DIAGNOSIS — R109 Unspecified abdominal pain: Secondary | ICD-10-CM | POA: Diagnosis not present

## 2024-08-18 DIAGNOSIS — K311 Adult hypertrophic pyloric stenosis: Secondary | ICD-10-CM | POA: Diagnosis present

## 2024-08-18 DIAGNOSIS — R110 Nausea: Secondary | ICD-10-CM | POA: Diagnosis not present

## 2024-08-18 DIAGNOSIS — Z95818 Presence of other cardiac implants and grafts: Secondary | ICD-10-CM | POA: Diagnosis present

## 2024-08-18 DIAGNOSIS — M542 Cervicalgia: Secondary | ICD-10-CM | POA: Diagnosis not present

## 2024-08-18 LAB — CBC
HCT: 38.9 % (ref 36.0–46.0)
Hemoglobin: 12.9 g/dL (ref 12.0–15.0)
MCH: 30.6 pg (ref 26.0–34.0)
MCHC: 33.2 g/dL (ref 30.0–36.0)
MCV: 92.2 fL (ref 80.0–100.0)
Platelets: 178 K/uL (ref 150–400)
RBC: 4.22 MIL/uL (ref 3.87–5.11)
RDW: 12.5 % (ref 11.5–15.5)
WBC: 9.6 K/uL (ref 4.0–10.5)
nRBC: 0 % (ref 0.0–0.2)

## 2024-08-18 LAB — HEPATIC FUNCTION PANEL
ALT: 20 U/L (ref 0–44)
AST: 29 U/L (ref 15–41)
Albumin: 4.1 g/dL (ref 3.5–5.0)
Alkaline Phosphatase: 49 U/L (ref 38–126)
Bilirubin, Direct: 0.1 mg/dL (ref 0.0–0.2)
Indirect Bilirubin: 0.6 mg/dL (ref 0.3–0.9)
Total Bilirubin: 0.7 mg/dL (ref 0.0–1.2)
Total Protein: 7.2 g/dL (ref 6.5–8.1)

## 2024-08-18 LAB — BASIC METABOLIC PANEL WITH GFR
Anion gap: 14 (ref 5–15)
BUN: 18 mg/dL (ref 8–23)
CO2: 25 mmol/L (ref 22–32)
Calcium: 10.6 mg/dL — ABNORMAL HIGH (ref 8.9–10.3)
Chloride: 98 mmol/L (ref 98–111)
Creatinine, Ser: 1.26 mg/dL — ABNORMAL HIGH (ref 0.44–1.00)
GFR, Estimated: 44 mL/min — ABNORMAL LOW (ref >60)
Glucose, Bld: 177 mg/dL — ABNORMAL HIGH (ref 70–99)
Potassium: 3.7 mmol/L (ref 3.5–5.1)
Sodium: 137 mmol/L (ref 135–145)

## 2024-08-18 LAB — URINALYSIS, ROUTINE W REFLEX MICROSCOPIC
Bacteria, UA: NONE SEEN
Bilirubin Urine: NEGATIVE
Glucose, UA: 50 mg/dL — AB
Hgb urine dipstick: NEGATIVE
Ketones, ur: 5 mg/dL — AB
Leukocytes,Ua: NEGATIVE
Nitrite: NEGATIVE
Protein, ur: 100 mg/dL — AB
Specific Gravity, Urine: 1.017 (ref 1.005–1.030)
pH: 8 (ref 5.0–8.0)

## 2024-08-18 LAB — LIPASE, BLOOD: Lipase: 272 U/L — ABNORMAL HIGH (ref 11–51)

## 2024-08-18 LAB — TROPONIN I (HIGH SENSITIVITY): Troponin I (High Sensitivity): 5 ng/L (ref <18)

## 2024-08-18 MED ORDER — LACTATED RINGERS IV BOLUS
500.0000 mL | Freq: Once | INTRAVENOUS | Status: AC
  Administered 2024-08-18: 500 mL via INTRAVENOUS

## 2024-08-18 MED ORDER — IOHEXOL 350 MG/ML SOLN
75.0000 mL | Freq: Once | INTRAVENOUS | Status: AC | PRN
  Administered 2024-08-18: 75 mL via INTRAVENOUS

## 2024-08-18 MED ORDER — ONDANSETRON HCL 4 MG/2ML IJ SOLN
4.0000 mg | Freq: Once | INTRAMUSCULAR | Status: AC
  Administered 2024-08-18: 4 mg via INTRAVENOUS
  Filled 2024-08-18: qty 2

## 2024-08-18 NOTE — H&P (Incomplete)
 History and Physical    Patient: Felicia Washington FMW:[id number] DOB: 1946-08-24 DOA: 08/18/2024 DOS: the patient was seen and examined on 08/18/2024 PCP: Nichole Senior, MD  Patient coming from: Home  Chief Complaint: No chief complaint on file.  HPI: Felicia Washington is a 78 y.o. female with medical history significant of ***  Review of Systems: {ROS_Text:26778} Past Medical History:  Diagnosis Date   Anemia    Arthritis    fingers, shoulders (09/01/2018)   Asthma    Celiac disease    GERD (gastroesophageal reflux disease)    Graves disease    Heart murmur    History of blood transfusion    related to c-section   History of gout    fingers   HLD (hyperlipidemia)    HTN (hypertension)    Hypothyroidism    Migraine    1 q couple years (09/01/2018)   Pneumonia    several times in 1 yr (09/01/2018)   PONV (postoperative nausea and vomiting)    Seasonal allergies    Stroke [facility])    jan 2023   Past Surgical History:  Procedure Laterality Date   BUBBLE STUDY  09/23/2022   Procedure: BUBBLE STUDY;  Surgeon: Michele Richardson, DO;  Location: [facility];  Service: Cardiovascular;;   CESAREAN SECTION  1975   COLONOSCOPY     POLYPECTOMY     REVERSE SHOULDER ARTHROPLASTY Right 09/01/2018   REVERSE SHOULDER ARTHROPLASTY Right 09/01/2018   Procedure: RIGHT REVERSE SHOULDER ARTHROPLASTY;  Surgeon: Melita Drivers, MD;  Location: [facility] OR;  Service: Orthopedics;  Laterality: Right;   SHOULDER ARTHROSCOPY W/ ROTATOR CUFF REPAIR Bilateral    TEE WITHOUT CARDIOVERSION N/A 09/23/2022   Procedure: TRANSESOPHAGEAL ECHOCARDIOGRAM (TEE);  Surgeon: Michele Richardson, DO;  Location: [facility]OS[facility];  Service: Cardiovascular;  Laterality: N/A;   THYROIDECTOMY     TONSILLECTOMY     Social History:  reports that she has never smoked. She has never used smokeless tobacco. She reports that she does not drink alcohol and does not use drugs.  Allergies  Allergen Reactions   Adhesive [Tape] Other (See Comments)     Removes skin   Allopurinol  Swelling, Hives and Itching   Codeine Nausea Only   Latex Other (See Comments)    Removes skin    Family History  Problem Relation Age of Onset   Thyroid  disease Mother    Gallstones Mother    Multiple myeloma Father    Bone cancer Father    Celiac disease Sister    Colon polyps Sister    Colon polyps Brother    Diabetes Maternal Grandmother    Heart attack Maternal Grandfather    Sleep apnea Daughter     Prior to Admission medications   Medication Sig Start Date End Date Taking? Authorizing Provider  amLODipine  (NORVASC ) 10 MG tablet Take 1 tablet (10 mg total) by mouth every morning. 11/24/23 11/18/24  Ladona Heinz, MD  aspirin  EC 81 MG tablet Take 81 mg by mouth in the morning. Swallow whole.    [provider]  atorvastatin  (LIPITOR) 40 MG tablet Take 40 mg by mouth in the morning.    [provider]  BREO ELLIPTA 100-25 MCG/ACT AEPB Inhale 1 puff into the lungs daily.    [provider]  buPROPion (WELLBUTRIN XL) 150 MG 24 hr tablet Take 150 mg by mouth in the morning.    [provider]  clonazePAM (KLONOPIN) 0.5 MG tablet Take 0.5 mg by mouth daily as needed for  anxiety. 02/04/22   [provider]  famotidine  (PEPCID ) 20 MG tablet Take 20 mg by mouth every evening.    [provider]  Febuxostat 80 MG TABS Take 80 mg by mouth every evening.    [provider]  fenofibrate  micronized (LOFIBRA) 67 MG capsule Take 67 mg by mouth in the morning. 08/08/18   [provider]  HYDROcodone -acetaminophen  (NORCO/VICODIN) 5-325 MG tablet Take 1 tablet by mouth every 6 (six) hours as needed for up to 8 doses for severe pain (pain score 7-10). 04/02/24   Lang Norleen POUR, PA-C  levothyroxine  (SYNTHROID , LEVOTHROID) 112 MCG tablet Take 56-224 mcg by mouth See admin instructions. Take 2 tablets (224 mcg) by mouth on Mondays through Saturdays before breakfast. Take 0.5 tablet (56 mcg) by mouth on  Sundays before breakfast.    [provider]  metoprolol  succinate (TOPROL  XL) 50 MG 24 hr tablet Take 1 tablet (50 mg total) by mouth at bedtime. 11/11/23   Lucien Orren SAILOR, PA-C  montelukast  (SINGULAIR ) 10 MG tablet Take 10 mg by mouth at bedtime.    [provider]  olmesartan  (BENICAR ) 40 MG tablet Take 1 tablet (40 mg total) by mouth every evening. 11/11/23   Lucien Orren SAILOR, PA-C  potassium chloride  (KLOR-CON ) 10 MEQ tablet Take 1 tablet (10 mEq total) by mouth every morning. 11/10/22 11/05/23  Ladona Heinz, MD    Physical Exam: Vitals:   08/18/24 1758  BP: 134/73  Pulse: 83  Resp: 16  Temp: 97.7 F (36.5 C)  TempSrc: Oral  SpO2: 100%   *** Data Reviewed: {Tip this will not be part of the note when signed- Document your independent interpretation of telemetry tracing, EKG, lab, Radiology test or any other diagnostic tests. Add any new diagnostic test ordered today. (Optional):26781} {Results:26384}  Assessment and Plan: No notes have been filed under this hospital service. Service: Hospitalist     Advance Care Planning:   Code Status: Prior ***  Consults: ***  Family Communication: ***  Severity of Illness: {Observation/Inpatient:21159}  Author: Bernardino KATHEE Come, MD 08/18/2024 10:58 PM  For on call review [email or web address].

## 2024-08-18 NOTE — ED Provider Triage Note (Signed)
 Emergency Medicine Provider Triage Evaluation Note  Felicia Washington , a 78 y.o. female  was evaluated in triage.  Pt complains of nausea, foamy spit up, generalized weakness, some new shoulder and epigastric pain.  Reports that she needs cardiac evaluation.  Reports history of questionable A-fib, no previous history of ACS, CAD.SABRA  Review of Systems  Positive: Nausea, vomiting, weakness, shoulder pain. Negative: Chest pain, shortness of breath  Physical Exam  BP 134/73   Pulse 83   Temp 97.7 F (36.5 C) (Oral)   Resp 16   SpO2 100%  Gen:   Awake, no distress   Resp:  Normal effort  MSK:   Moves extremities without difficulty  Other:    Medical Decision Making  Medically screening exam initiated at 6:06 PM.  Appropriate orders placed.  Felicia Washington was informed that the remainder of the evaluation will be completed by another provider, this initial triage assessment does not replace that evaluation, and the importance of remaining in the ED until their evaluation is complete.  Workup initiated in triage    Felicia Washington DEL, [place] 08/18/24 [id number]

## 2024-08-18 NOTE — ED Triage Notes (Addendum)
 Pt BIb [facility] from home for nausea and foamy spit up and generalized weakness.  A little anxiety.  Pt states she has pain in epigastric area and in shoulders beginning today. Takes potassium and a fluid pill.  VSS

## 2024-08-18 NOTE — ED Provider Notes (Signed)
 [place] EMERGENCY DEPARTMENT AT [facility] Provider Note   CSN: [id number] Arrival date & time: 08/18/24  1753     Patient presents with: No chief complaint on file.   Felicia Washington is a 78 y.o. female.   HPI   Patient has a history of Graves' disease celiac disease acid reflux hypertension hyperlipidemia hypothyroidism pneumonia gout, stroke.  Patient presented to the ED with complaints of nausea vomiting as well as chest and abdominal pain.  Patient states symptoms started this evening.  She started spitting up foamy mucus.  Patient also began vomiting.  She started having pain in upper abdomen that radiated to her chest.  She denies any diarrhea.  No dysuria.  She does not have any difficulty breathing.  She has history of C-section but no other abdominal surgeries  Prior to Admission medications   Medication Sig Start Date End Date Taking? Authorizing Provider  amLODipine  (NORVASC ) 10 MG tablet Take 1 tablet (10 mg total) by mouth every morning. 11/24/23 11/18/24  Ladona Heinz, MD  aspirin  EC 81 MG tablet Take 81 mg by mouth in the morning. Swallow whole.    [provider]  atorvastatin  (LIPITOR) 40 MG tablet Take 40 mg by mouth in the morning.    [provider]  BREO ELLIPTA 100-25 MCG/ACT AEPB Inhale 1 puff into the lungs daily.    [provider]  buPROPion (WELLBUTRIN XL) 150 MG 24 hr tablet Take 150 mg by mouth in the morning.    [provider]  clonazePAM (KLONOPIN) 0.5 MG tablet Take 0.5 mg by mouth daily as needed for anxiety. 02/04/22   [provider]  famotidine  (PEPCID ) 20 MG tablet Take 20 mg by mouth every evening.    [provider]  Febuxostat 80 MG TABS Take 80 mg by mouth every evening.    [provider]  fenofibrate  micronized (LOFIBRA) 67 MG capsule Take 67 mg by mouth in the morning. 08/08/18   [provider]  HYDROcodone -acetaminophen  (NORCO/VICODIN) 5-325 MG tablet Take 1  tablet by mouth every 6 (six) hours as needed for up to 8 doses for severe pain (pain score 7-10). 04/02/24   Lang Norleen POUR, PA-C  levothyroxine  (SYNTHROID , LEVOTHROID) 112 MCG tablet Take 56-224 mcg by mouth See admin instructions. Take 2 tablets (224 mcg) by mouth on Mondays through Saturdays before breakfast. Take 0.5 tablet (56 mcg) by mouth on Sundays before breakfast.    [provider]  metoprolol  succinate (TOPROL  XL) 50 MG 24 hr tablet Take 1 tablet (50 mg total) by mouth at bedtime. 11/11/23   Lucien Orren SAILOR, PA-C  montelukast  (SINGULAIR ) 10 MG tablet Take 10 mg by mouth at bedtime.    [provider]  olmesartan  (BENICAR ) 40 MG tablet Take 1 tablet (40 mg total) by mouth every evening. 11/11/23   Lucien Orren SAILOR, PA-C  potassium chloride  (KLOR-CON ) 10 MEQ tablet Take 1 tablet (10 mEq total) by mouth every morning. 11/10/22 11/05/23  Ladona Heinz, MD    Allergies: Adhesive [tape], Allopurinol , Codeine, and Latex    Review of Systems  Updated Vital Signs BP 134/73   Pulse 83   Temp 97.7 F (36.5 C) (Oral)   Resp 16   SpO2 100%   Physical Exam Vitals and nursing note reviewed.  Constitutional:      General: She is not in acute distress.    Appearance: She is well-developed.  HENT:     Head: Normocephalic and atraumatic.  Right Ear: External ear normal.     Left Ear: External ear normal.  Eyes:     General: No scleral icterus.       Right eye: No discharge.        Left eye: No discharge.     Conjunctiva/sclera: Conjunctivae normal.  Neck:     Trachea: No tracheal deviation.  Cardiovascular:     Rate and Rhythm: Normal rate and regular rhythm.  Pulmonary:     Effort: Pulmonary effort is normal. No respiratory distress.     Breath sounds: Normal breath sounds. No stridor. No wheezing or rales.  Abdominal:     General: Bowel sounds are normal. There is no distension.     Palpations: Abdomen is soft.     Tenderness: There is abdominal tenderness.  There is no guarding or rebound.     Comments: Tenderness palpation epigastric region  Musculoskeletal:        General: No tenderness or deformity.     Cervical back: Neck supple.  Skin:    General: Skin is warm and dry.     Findings: No rash.  Neurological:     General: No focal deficit present.     Mental Status: She is alert.     Cranial Nerves: No cranial nerve deficit, dysarthria or facial asymmetry.     Sensory: No sensory deficit.     Motor: No abnormal muscle tone or seizure activity.     Coordination: Coordination normal.  Psychiatric:        Mood and Affect: Mood normal.     (all labs ordered are listed, but only abnormal results are displayed) Labs Reviewed  BASIC METABOLIC PANEL WITH GFR - Abnormal; Notable for the following components:      Result Value   Glucose, Bld 177 (*)    Creatinine, Ser 1.26 (*)    Calcium  10.6 (*)    GFR, Estimated 44 (*)    All other components within normal limits  URINALYSIS, ROUTINE W REFLEX MICROSCOPIC - Abnormal; Notable for the following components:   APPearance HAZY (*)    Glucose, UA 50 (*)    Ketones, ur 5 (*)    Protein, ur 100 (*)    All other components within normal limits  LIPASE, BLOOD - Abnormal; Notable for the following components:   Lipase 272 (*)    All other components within normal limits  CBC  HEPATIC FUNCTION PANEL  TROPONIN I (HIGH SENSITIVITY)    EKG: EKG Interpretation Date/Time:  Friday August 18 2024 18:10:54 EDT Ventricular Rate:  85 PR Interval:  168 QRS Duration:  86 QT Interval:  356 QTC Calculation: 423 R Axis:   60  Text Interpretation: Sinus rhythm with Premature supraventricular complexes Nonspecific ST and T wave abnormality Abnormal ECG When compared with ECG of 11-Nov-2023 13:33, Since last tracing rate faster Confirmed by Randol Simmonds [phone]) on 08/18/2024 8:53:03 PM  Radiology: CT Angio Chest PE W and/or Wo Contrast Addendum Date: 08/18/2024 ADDENDUM REPORT: 08/18/2024 22:37  ADDENDUM: These results were called by telephone at the time of interpretation on 08/18/2024 at 10:37 pm to provider Alexandr Yaworski , who verbally acknowledged these results. Electronically Signed   By: Morgane  Naveau M.D.   On: 08/18/2024 22:37   Result Date: 08/18/2024 CLINICAL DATA:  Abdominal pain, acute, nonlocalized; Pulmonary embolism (PE) suspected, high prob Pt complains of nausea, foamy spit up, generalized weakness, some new shoulder and epigastric pain. EXAM: CT ANGIOGRAPHY CHEST CT ABDOMEN AND PELVIS WITH  CONTRAST TECHNIQUE: Multidetector CT imaging of the chest was performed using the standard protocol during bolus administration of intravenous contrast. Multiplanar CT image reconstructions and MIPs were obtained to evaluate the vascular anatomy. Multidetector CT imaging of the abdomen and pelvis was performed using the standard protocol during bolus administration of intravenous contrast. RADIATION DOSE REDUCTION: This exam was performed according to the departmental dose-optimization program which includes automated exposure control, adjustment of the mA and/or kV according to patient size and/or use of iterative reconstruction technique. CONTRAST:  75mL OMNIPAQUE  IOHEXOL  350 MG/ML SOLN COMPARISON:  None Available. FINDINGS: CTA CHEST FINDINGS Cardiovascular: Satisfactory opacification of the pulmonary arteries to the segmental level. No evidence of pulmonary embolism. The main pulmonary artery is enlarged in caliber measuring up to 3.3 cm. Prominent heart size. No significant pericardial effusion. The thoracic aorta is normal in caliber. No atherosclerotic plaque of the thoracic aorta. No coronary artery calcifications. Mediastinum/Nodes: No enlarged mediastinal, hilar, or axillary lymph nodes. Thyroid  gland, trachea demonstrate no significant findings. Diffuse esophageal dilatation with fluid within the lumen. Large paraesophageal hernia. Lungs/Pleura: Bibasilar atelectasis. No focal consolidation. No  pulmonary nodule. No pulmonary mass. No pleural effusion. No pneumothorax. Musculoskeletal: No chest wall abnormality. No suspicious lytic or blastic osseous lesions. No acute displaced fracture. Reverse total right shoulder arthroplasty. Review of the MIP images confirms the above findings. CT ABDOMEN and PELVIS FINDINGS Hepatobiliary: No focal liver abnormality. Calcified gallstone noted within the gallbladder lumen. No gallbladder wall thickening or pericholecystic fluid. No biliary dilatation. Pancreas: No focal lesion. Normal pancreatic contour. No surrounding inflammatory changes. No main pancreatic ductal dilatation. Spleen: Normal in size without focal abnormality. Adrenals/Urinary Tract: No adrenal nodule bilaterally. Bilateral kidneys enhance symmetrically. Hypodense lesions likely represent simple renal cysts. Simple renal cysts, in the absence of clinically indicated signs/symptoms, require no independent follow-up. No hydronephrosis. No hydroureter. The urinary bladder is unremarkable. On delayed imaging, there is no urothelial wall thickening and there are no filling defects in the opacified portions of the bilateral collecting systems or ureters. 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 The herniated stomach as well as the proximal stomach inferior to the hemidiaphragm are dilated with fluid. Transition point is noted of the gastric pylorus at the hiatal hernia re-entry into the abdomen. No associated gastric wall thickening or pneumatosis. Otherwise no evidence of small or large bowel wall thickening or dilatation. Colonic diverticulosis. Appendix appears normal. Vascular/Lymphatic: No abdominal aorta or iliac aneurysm. Moderate atherosclerotic plaque of the aorta and its branches. No abdominal, pelvic, or inguinal lymphadenopathy. Reproductive: Uterus and bilateral adnexa are unremarkable. Other: No intraperitoneal free fluid. No intraperitoneal free gas. No organized fluid  collection. Musculoskeletal: No abdominal wall hernia or abnormality. No suspicious lytic or blastic osseous lesions. No acute displaced fracture. Old healed insufficiency fractures. Review of the MIP images confirms the above findings. IMPRESSION: 1. 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 Fluid dilated esophageal and gastric lumen. No definite findings of bowel ischemia or perforation. Recommend enteric tube placement. Recommend emergent surgical consultation. 2. No pulmonary embolus. 3. Enlarged main pulmonary artery-correlate for pulmonary hypertension. 4. Cholelithiasis with no acute cholecystitis. 5. Colonic diverticulosis with no acute diverticulitis. Aortic Atherosclerosis (ICD10-I70.0). Electronically Signed: By: Morgane  Naveau M.D. On: 08/18/2024 22:27   CT ABDOMEN PELVIS W CONTRAST Addendum Date: 08/18/2024 ADDENDUM REPORT: 08/18/2024 22:37 ADDENDUM: These results were called by telephone at the time of interpretation on 08/18/2024 at 10:37 pm to provider Macguire Holsinger , who verbally  acknowledged these results. Electronically Signed   By: Morgane  Naveau M.D.   On: 08/18/2024 22:37   Result Date: 08/18/2024 CLINICAL DATA:  Abdominal pain, acute, nonlocalized; Pulmonary embolism (PE) suspected, high prob Pt complains of nausea, foamy spit up, generalized weakness, some new shoulder and epigastric pain. EXAM: CT ANGIOGRAPHY CHEST CT ABDOMEN AND PELVIS WITH CONTRAST TECHNIQUE: Multidetector CT imaging of the chest was performed using the standard protocol during bolus administration of intravenous contrast. Multiplanar CT image reconstructions and MIPs were obtained to evaluate the vascular anatomy. Multidetector CT imaging of the abdomen and pelvis was performed using the standard protocol during bolus administration of intravenous contrast. RADIATION DOSE REDUCTION: This exam was performed according to the departmental dose-optimization program  which includes automated exposure control, adjustment of the mA and/or kV according to patient size and/or use of iterative reconstruction technique. CONTRAST:  75mL OMNIPAQUE  IOHEXOL  350 MG/ML SOLN COMPARISON:  None Available. FINDINGS: CTA CHEST FINDINGS Cardiovascular: Satisfactory opacification of the pulmonary arteries to the segmental level. No evidence of pulmonary embolism. The main pulmonary artery is enlarged in caliber measuring up to 3.3 cm. Prominent heart size. No significant pericardial effusion. The thoracic aorta is normal in caliber. No atherosclerotic plaque of the thoracic aorta. No coronary artery calcifications. Mediastinum/Nodes: No enlarged mediastinal, hilar, or axillary lymph nodes. Thyroid  gland, trachea demonstrate no significant findings. Diffuse esophageal dilatation with fluid within the lumen. Large paraesophageal hernia. Lungs/Pleura: Bibasilar atelectasis. No focal consolidation. No pulmonary nodule. No pulmonary mass. No pleural effusion. No pneumothorax. Musculoskeletal: No chest wall abnormality. No suspicious lytic or blastic osseous lesions. No acute displaced fracture. Reverse total right shoulder arthroplasty. Review of the MIP images confirms the above findings. CT ABDOMEN and PELVIS FINDINGS Hepatobiliary: No focal liver abnormality. Calcified gallstone noted within the gallbladder lumen. No gallbladder wall thickening or pericholecystic fluid. No biliary dilatation. Pancreas: No focal lesion. Normal pancreatic contour. No surrounding inflammatory changes. No main pancreatic ductal dilatation. Spleen: Normal in size without focal abnormality. Adrenals/Urinary Tract: No adrenal nodule bilaterally. Bilateral kidneys enhance symmetrically. Hypodense lesions likely represent simple renal cysts. Simple renal cysts, in the absence of clinically indicated signs/symptoms, require no independent follow-up. No hydronephrosis. No hydroureter. The urinary bladder is unremarkable. On  delayed imaging, there is no urothelial wall thickening and there are no filling defects in the opacified portions of the bilateral collecting systems or ureters. 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 The herniated stomach as well as the proximal stomach inferior to the hemidiaphragm are dilated with fluid. Transition point is noted of the gastric pylorus at the hiatal hernia re-entry into the abdomen. No associated gastric wall thickening or pneumatosis. Otherwise no evidence of small or large bowel wall thickening or dilatation. Colonic diverticulosis. Appendix appears normal. Vascular/Lymphatic: No abdominal aorta or iliac aneurysm. Moderate atherosclerotic plaque of the aorta and its branches. No abdominal, pelvic, or inguinal lymphadenopathy. Reproductive: Uterus and bilateral adnexa are unremarkable. Other: No intraperitoneal free fluid. No intraperitoneal free gas. No organized fluid collection. Musculoskeletal: No abdominal wall hernia or abnormality. No suspicious lytic or blastic osseous lesions. No acute displaced fracture. Old healed insufficiency fractures. Review of the MIP images confirms the above findings. IMPRESSION: 1. 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 Fluid dilated esophageal and gastric lumen. No definite findings of bowel ischemia or perforation. Recommend enteric tube placement. Recommend emergent surgical consultation. 2. No pulmonary embolus. 3. Enlarged main pulmonary artery-correlate for pulmonary hypertension. 4. Cholelithiasis with no acute  cholecystitis. 5. Colonic diverticulosis with no acute diverticulitis. Aortic Atherosclerosis (ICD10-I70.0). Electronically Signed: By: Morgane  Naveau M.D. On: 08/18/2024 22:27   DG Chest 2 View Result Date: 08/18/2024 CLINICAL DATA:  Epigastric in shoulder pain. EXAM: CHEST - 2 VIEW COMPARISON:  01/01/2022. FINDINGS: Mild cardiomegaly. Cardiac monitoring  device along the left chest wall. Moderate-to-large hiatal hernia, similar to the prior exam. No overt pulmonary edema, focal consolidation, pleural effusion, or pneumothorax. Diffuse osseous demineralization. Right total reverse shoulder arthroplasty. No acute osseous abnormality. IMPRESSION: 1. No acute cardiopulmonary findings. 2. Mild cardiomegaly. 3. Moderate-to-large hiatal hernia, similar to the prior exam. Electronically Signed   By: Harrietta Sherry M.D.   On: 08/18/2024 19:28     .Critical Care  Performed by: Randol Simmonds, MD Authorized by: Randol Simmonds, MD   Critical care provider statement:    Critical care time (minutes):  30   Critical care was time spent personally by me on the following activities:  Development of treatment plan with patient or surrogate, discussions with consultants, evaluation of patient's response to treatment, examination of patient, ordering and review of laboratory studies, ordering and review of radiographic studies, ordering and performing treatments and interventions, pulse oximetry, re-evaluation of patient's condition and review of old charts    Medications Ordered in the ED  ondansetron  (ZOFRAN ) injection 4 mg (4 mg Intravenous Given 08/18/24 2212)  lactated ringers  bolus 500 mL (500 mLs Intravenous New Bag/Given 08/18/24 2216)  iohexol  (OMNIPAQUE ) 350 MG/ML injection 75 mL (75 mLs Intravenous Contrast Given 08/18/24 2212)    Clinical Course as of 08/18/24 2249  Fri Aug 18, 2024  2053 CBC normal.  Metabolic panel shows slightly increased creatinine but similar to previous values.  Hepatic function panel normal.  Troponin normal.  Urinalysis normal. [JK]  2234 CT scan shows a large paraesophageal hernia associated with high-grade gastric obstruction [JK]  2242 Case discussed with Dr. Paola.  She will see patient in consultation.   [JK]    Clinical Course User Index [JK] Randol Simmonds, MD                                 Medical Decision  Making Differential diagnosis includes but not limited to hepatitis pancreatitis choledocholithiasis bowel obstruction, NSTEMI  Problems Addressed: Gastric outlet obstruction: acute illness or injury that poses a threat to life or bodily functions Paraesophageal hernia: acute illness or injury  Amount and/or Complexity of Data Reviewed Labs: ordered. Radiology: ordered.  Risk Prescription drug management. Decision regarding hospitalization.   Patient presented to ED with complaints of abdominal pain chest pain vomiting.   Patient's laboratory test do not show any signs of anemia.  No signs of hepatitis.  Lipase noted to be elevated.  No pancreatitis noted on CT.    CT scans of the chest abdomen pelvis performed.  Patient without signs of pulmonary embolism.  Cardiac enzymes normal.  No signs of NSTEMI.  CT scan does show paraesophageal hernia with gastric outlet obstruction.  NG tube has been ordered for decompression.  Have consulted with general surgery Dr. Paola will see patient.  Patient will be admitted to the hospital for further evaluation.  Case discussed Dr. Bryn    Final diagnoses:  Paraesophageal hernia  Gastric outlet obstruction    ED Discharge Orders     None          Randol Simmonds, MD 08/18/24 2258

## 2024-08-18 NOTE — Consult Note (Addendum)
 Reason for Consult/Chief Complaint: PEH Consultant: Randol, MD  Felicia Washington is an 78 y.o. female.   HPI: 9F with history of GERD, HTN, HLD, hypothyroidism, Graves' disease, stroke who presented to the ED with abrupt onset of pain in the upper abdomen and lower chest and n/v.  Past Medical History:  Diagnosis Date   Anemia    Arthritis    fingers, shoulders (09/01/2018)   Asthma    Celiac disease    GERD (gastroesophageal reflux disease)    Graves disease    Heart murmur    History of blood transfusion    related to c-section   History of gout    fingers   HLD (hyperlipidemia)    HTN (hypertension)    Hypothyroidism    Migraine    1 q couple years (09/01/2018)   Pneumonia    several times in 1 yr (09/01/2018)   PONV (postoperative nausea and vomiting)    Seasonal allergies    Stroke [facility])    jan 2023    Past Surgical History:  Procedure Laterality Date   BUBBLE STUDY  09/23/2022   Procedure: BUBBLE STUDY;  Surgeon: Michele Richardson, DO;  Location: [facility];  Service: Cardiovascular;;   CESAREAN SECTION  1975   COLONOSCOPY     POLYPECTOMY     REVERSE SHOULDER ARTHROPLASTY Right 09/01/2018   REVERSE SHOULDER ARTHROPLASTY Right 09/01/2018   Procedure: RIGHT REVERSE SHOULDER ARTHROPLASTY;  Surgeon: Melita Drivers, MD;  Location: [facility] OR;  Service: Orthopedics;  Laterality: Right;   SHOULDER ARTHROSCOPY W/ ROTATOR CUFF REPAIR Bilateral    TEE WITHOUT CARDIOVERSION N/A 09/23/2022   Procedure: TRANSESOPHAGEAL ECHOCARDIOGRAM (TEE);  Surgeon: Michele Richardson, DO;  Location: [facility];  Service: Cardiovascular;  Laterality: N/A;   THYROIDECTOMY     TONSILLECTOMY      Family History  Problem Relation Age of Onset   Thyroid  disease Mother    Gallstones Mother    Multiple myeloma Father    Bone cancer Father    Celiac disease Sister    Colon polyps Sister    Colon polyps Brother    Diabetes Maternal Grandmother    Heart attack Maternal Grandfather    Sleep apnea  Daughter     Social History:  reports that she has never smoked. She has never used smokeless tobacco. She reports that she does not drink alcohol and does not use drugs.  Allergies:  Allergies  Allergen Reactions   Adhesive [Tape] Other (See Comments)    Removes skin   Allopurinol  Swelling, Hives and Itching   Codeine Nausea Only   Latex Other (See Comments)    Removes skin    Medications: I have reviewed the patient's current medications.  Results for orders placed or performed during the hospital encounter of 08/18/24 (from the past 48 hours)  Basic metabolic panel     Status: Abnormal   Collection Time: 08/18/24  6:04 PM  Result Value Ref Range   Sodium 137 135 - 145 mmol/L   Potassium 3.7 3.5 - 5.1 mmol/L   Chloride 98 98 - 111 mmol/L   CO2 25 22 - 32 mmol/L   Glucose, Bld 177 (H) 70 - 99 mg/dL    Comment: Glucose reference range applies only to samples taken after fasting for at least 8 hours.   BUN 18 8 - 23 mg/dL   Creatinine, Ser 8.73 (H) 0.44 - 1.00 mg/dL   Calcium  10.6 (H) 8.9 - 10.3 mg/dL   GFR, Estimated  44 (L) >60 mL/min    Comment: (NOTE) Calculated using the CKD-EPI Creatinine Equation (2021)    Anion gap 14 5 - 15    Comment: Performed at [facility], [address], [place], [place] [address]  CBC     Status: None   Collection Time: 08/18/24  6:04 PM  Result Value Ref Range   WBC 9.6 4.0 - 10.5 K/uL   RBC 4.22 3.87 - 5.11 MIL/uL   Hemoglobin 12.9 12.0 - 15.0 g/dL   HCT 61.0 63.9 - 53.9 %   MCV 92.2 80.0 - 100.0 fL   MCH 30.6 26.0 - 34.0 pg   MCHC 33.2 30.0 - 36.0 g/dL   RDW 87.4 88.4 - 84.4 %   Platelets 178 150 - 400 K/uL   nRBC 0.0 0.0 - 0.2 %    Comment: Performed at [facility], [address], [place], [place] [address]  Troponin I (High Sensitivity)     Status: None   Collection Time: 08/18/24  6:04 PM  Result Value Ref Range   Troponin I (High Sensitivity) 5 <18 ng/L    Comment: (NOTE) Elevated high sensitivity troponin I  (hsTnI) values and significant  changes across serial measurements may suggest ACS but many other  chronic and acute conditions are known to elevate hsTnI results.  Refer to the Links section for chest pain algorithms and additional  guidance. Performed at [facility], [address], [place], [place] [address]   Hepatic function panel     Status: None   Collection Time: 08/18/24  6:04 PM  Result Value Ref Range   Total Protein 7.2 6.5 - 8.1 g/dL   Albumin  4.1 3.5 - 5.0 g/dL   AST 29 15 - 41 U/L   ALT 20 0 - 44 U/L   Alkaline Phosphatase 49 38 - 126 U/L   Total Bilirubin 0.7 0.0 - 1.2 mg/dL   Bilirubin, Direct 0.1 0.0 - 0.2 mg/dL   Indirect Bilirubin 0.6 0.3 - 0.9 mg/dL    Comment: Performed at [facility], [address] [place], [place] [address]  Urinalysis, Routine w reflex microscopic -Urine, Clean Catch     Status: Abnormal   Collection Time: 08/18/24  6:42 PM  Result Value Ref Range   Color, Urine YELLOW YELLOW   APPearance HAZY (A) CLEAR   Specific Gravity, Urine 1.017 1.005 - 1.030   pH 8.0 5.0 - 8.0   Glucose, UA 50 (A) NEGATIVE mg/dL   Hgb urine dipstick NEGATIVE NEGATIVE   Bilirubin Urine NEGATIVE NEGATIVE   Ketones, ur 5 (A) NEGATIVE mg/dL   Protein, ur 899 (A) NEGATIVE mg/dL   Nitrite NEGATIVE NEGATIVE   Leukocytes,Ua NEGATIVE NEGATIVE   RBC / HPF 0-5 0 - 5 RBC/hpf   WBC, UA 0-5 0 - 5 WBC/hpf   Bacteria, UA NONE SEEN NONE SEEN   Squamous Epithelial / HPF 0-5 0 - 5 /HPF   Mucus PRESENT     Comment: Performed at [facility], [address], [place], [place] [address]    CT Angio Chest PE W and/or Wo Contrast Addendum Date: 08/18/2024 ADDENDUM REPORT: 08/18/2024 22:37 ADDENDUM: These results were called by telephone at the time of interpretation on 08/18/2024 at 10:37 pm to provider JON KNAPP , who verbally acknowledged these results. Electronically Signed   By: Morgane  Naveau M.D.   On: 08/18/2024 22:37   Result Date:  08/18/2024 CLINICAL DATA:  Abdominal pain,  acute, nonlocalized; Pulmonary embolism (PE) suspected, high prob Pt complains of nausea, foamy spit up, generalized weakness, some new shoulder and epigastric pain. EXAM: CT ANGIOGRAPHY CHEST CT ABDOMEN AND PELVIS WITH CONTRAST TECHNIQUE: Multidetector CT imaging of the chest was performed using the standard protocol during bolus administration of intravenous contrast. Multiplanar CT image reconstructions and MIPs were obtained to evaluate the vascular anatomy. Multidetector CT imaging of the abdomen and pelvis was performed using the standard protocol during bolus administration of intravenous contrast. RADIATION DOSE REDUCTION: This exam was performed according to the departmental dose-optimization program which includes automated exposure control, adjustment of the mA and/or kV according to patient size and/or use of iterative reconstruction technique. CONTRAST:  75mL OMNIPAQUE  IOHEXOL  350 MG/ML SOLN COMPARISON:  None Available. FINDINGS: CTA CHEST FINDINGS Cardiovascular: Satisfactory opacification of the pulmonary arteries to the segmental level. No evidence of pulmonary embolism. The main pulmonary artery is enlarged in caliber measuring up to 3.3 cm. Prominent heart size. No significant pericardial effusion. The thoracic aorta is normal in caliber. No atherosclerotic plaque of the thoracic aorta. No coronary artery calcifications. Mediastinum/Nodes: No enlarged mediastinal, hilar, or axillary lymph nodes. Thyroid  gland, trachea demonstrate no significant findings. Diffuse esophageal dilatation with fluid within the lumen. Large paraesophageal hernia. Lungs/Pleura: Bibasilar atelectasis. No focal consolidation. No pulmonary nodule. No pulmonary mass. No pleural effusion. No pneumothorax. Musculoskeletal: No chest wall abnormality. No suspicious lytic or blastic osseous lesions. No acute displaced fracture. Reverse total right shoulder arthroplasty. Review of the MIP  images confirms the above findings. CT ABDOMEN and PELVIS FINDINGS Hepatobiliary: No focal liver abnormality. Calcified gallstone noted within the gallbladder lumen. No gallbladder wall thickening or pericholecystic fluid. No biliary dilatation. Pancreas: No focal lesion. Normal pancreatic contour. No surrounding inflammatory changes. No main pancreatic ductal dilatation. Spleen: Normal in size without focal abnormality. Adrenals/Urinary Tract: No adrenal nodule bilaterally. Bilateral kidneys enhance symmetrically. Hypodense lesions likely represent simple renal cysts. Simple renal cysts, in the absence of clinically indicated signs/symptoms, require no independent follow-up. No hydronephrosis. No hydroureter. The urinary bladder is unremarkable. On delayed imaging, there is no urothelial wall thickening and there are no filling defects in the opacified portions of the bilateral collecting systems or ureters. 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 The herniated stomach as well as the proximal stomach inferior to the hemidiaphragm are dilated with fluid. Transition point is noted of the gastric pylorus at the hiatal hernia re-entry into the abdomen. No associated gastric wall thickening or pneumatosis. Otherwise no evidence of small or large bowel wall thickening or dilatation. Colonic diverticulosis. Appendix appears normal. Vascular/Lymphatic: No abdominal aorta or iliac aneurysm. Moderate atherosclerotic plaque of the aorta and its branches. No abdominal, pelvic, or inguinal lymphadenopathy. Reproductive: Uterus and bilateral adnexa are unremarkable. Other: No intraperitoneal free fluid. No intraperitoneal free gas. No organized fluid collection. Musculoskeletal: No abdominal wall hernia or abnormality. No suspicious lytic or blastic osseous lesions. No acute displaced fracture. Old healed insufficiency fractures. Review of the MIP images confirms the above findings. IMPRESSION: 1. 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 Fluid dilated esophageal and gastric lumen. No definite findings of bowel ischemia or perforation. Recommend enteric tube placement. Recommend emergent surgical consultation. 2. No pulmonary embolus. 3. Enlarged main pulmonary artery-correlate for pulmonary hypertension. 4. Cholelithiasis with no acute cholecystitis. 5. Colonic diverticulosis with no acute diverticulitis. Aortic Atherosclerosis (ICD10-I70.0). Electronically Signed: By: Morgane  Naveau M.D. On: 08/18/2024 22:27   CT ABDOMEN PELVIS W  CONTRAST Addendum Date: 08/18/2024 ADDENDUM REPORT: 08/18/2024 22:37 ADDENDUM: These results were called by telephone at the time of interpretation on 08/18/2024 at 10:37 pm to provider JON KNAPP , who verbally acknowledged these results. Electronically Signed   By: Morgane  Naveau M.D.   On: 08/18/2024 22:37   Result Date: 08/18/2024 CLINICAL DATA:  Abdominal pain, acute, nonlocalized; Pulmonary embolism (PE) suspected, high prob Pt complains of nausea, foamy spit up, generalized weakness, some new shoulder and epigastric pain. EXAM: CT ANGIOGRAPHY CHEST CT ABDOMEN AND PELVIS WITH CONTRAST TECHNIQUE: Multidetector CT imaging of the chest was performed using the standard protocol during bolus administration of intravenous contrast. Multiplanar CT image reconstructions and MIPs were obtained to evaluate the vascular anatomy. Multidetector CT imaging of the abdomen and pelvis was performed using the standard protocol during bolus administration of intravenous contrast. RADIATION DOSE REDUCTION: This exam was performed according to the departmental dose-optimization program which includes automated exposure control, adjustment of the mA and/or kV according to patient size and/or use of iterative reconstruction technique. CONTRAST:  75mL OMNIPAQUE  IOHEXOL  350 MG/ML SOLN COMPARISON:  None Available. FINDINGS: CTA CHEST FINDINGS  Cardiovascular: Satisfactory opacification of the pulmonary arteries to the segmental level. No evidence of pulmonary embolism. The main pulmonary artery is enlarged in caliber measuring up to 3.3 cm. Prominent heart size. No significant pericardial effusion. The thoracic aorta is normal in caliber. No atherosclerotic plaque of the thoracic aorta. No coronary artery calcifications. Mediastinum/Nodes: No enlarged mediastinal, hilar, or axillary lymph nodes. Thyroid  gland, trachea demonstrate no significant findings. Diffuse esophageal dilatation with fluid within the lumen. Large paraesophageal hernia. Lungs/Pleura: Bibasilar atelectasis. No focal consolidation. No pulmonary nodule. No pulmonary mass. No pleural effusion. No pneumothorax. Musculoskeletal: No chest wall abnormality. No suspicious lytic or blastic osseous lesions. No acute displaced fracture. Reverse total right shoulder arthroplasty. Review of the MIP images confirms the above findings. CT ABDOMEN and PELVIS FINDINGS Hepatobiliary: No focal liver abnormality. Calcified gallstone noted within the gallbladder lumen. No gallbladder wall thickening or pericholecystic fluid. No biliary dilatation. Pancreas: No focal lesion. Normal pancreatic contour. No surrounding inflammatory changes. No main pancreatic ductal dilatation. Spleen: Normal in size without focal abnormality. Adrenals/Urinary Tract: No adrenal nodule bilaterally. Bilateral kidneys enhance symmetrically. Hypodense lesions likely represent simple renal cysts. Simple renal cysts, in the absence of clinically indicated signs/symptoms, require no independent follow-up. No hydronephrosis. No hydroureter. The urinary bladder is unremarkable. On delayed imaging, there is no urothelial wall thickening and there are no filling defects in the opacified portions of the bilateral collecting systems or ureters. 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 The herniated stomach as well  as the proximal stomach inferior to the hemidiaphragm are dilated with fluid. Transition point is noted of the gastric pylorus at the hiatal hernia re-entry into the abdomen. No associated gastric wall thickening or pneumatosis. Otherwise no evidence of small or large bowel wall thickening or dilatation. Colonic diverticulosis. Appendix appears normal. Vascular/Lymphatic: No abdominal aorta or iliac aneurysm. Moderate atherosclerotic plaque of the aorta and its branches. No abdominal, pelvic, or inguinal lymphadenopathy. Reproductive: Uterus and bilateral adnexa are unremarkable. Other: No intraperitoneal free fluid. No intraperitoneal free gas. No organized fluid collection. Musculoskeletal: No abdominal wall hernia or abnormality. No suspicious lytic or blastic osseous lesions. No acute displaced fracture. Old healed insufficiency fractures. Review of the MIP images confirms the above findings. IMPRESSION: 1. 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 Fluid dilated esophageal and gastric lumen. No  definite findings of bowel ischemia or perforation. Recommend enteric tube placement. Recommend emergent surgical consultation. 2. No pulmonary embolus. 3. Enlarged main pulmonary artery-correlate for pulmonary hypertension. 4. Cholelithiasis with no acute cholecystitis. 5. Colonic diverticulosis with no acute diverticulitis. Aortic Atherosclerosis (ICD10-I70.0). Electronically Signed: By: Morgane  Naveau M.D. On: 08/18/2024 22:27   DG Chest 2 View Result Date: 08/18/2024 CLINICAL DATA:  Epigastric in shoulder pain. EXAM: CHEST - 2 VIEW COMPARISON:  01/01/2022. FINDINGS: Mild cardiomegaly. Cardiac monitoring device along the left chest wall. Moderate-to-large hiatal hernia, similar to the prior exam. No overt pulmonary edema, focal consolidation, pleural effusion, or pneumothorax. Diffuse osseous demineralization. Right total reverse shoulder arthroplasty. No  acute osseous abnormality. IMPRESSION: 1. No acute cardiopulmonary findings. 2. Mild cardiomegaly. 3. Moderate-to-large hiatal hernia, similar to the prior exam. Electronically Signed   By: Harrietta Sherry M.D.   On: 08/18/2024 19:28    ROS 10 point review of systems is negative except as listed above in HPI.   Physical Exam Blood pressure 134/73, pulse 83, temperature 97.7 F (36.5 C), temperature source Oral, resp. rate 16, SpO2 100%. Constitutional: well-developed, well-nourished HEENT: pupils equal, round, reactive to light, 2mm b/l, moist conjunctiva, external inspection of ears and nose normal, hearing intact Oropharynx: normal oropharyngeal mucosa, normal dentition Neck: no thyromegaly, trachea midline, no midline cervical tenderness to palpation Chest: breath sounds equal bilaterally, normal respiratory effort, no midline or lateral chest wall tenderness to palpation/deformity Abdomen: soft, NT, no bruising, no hepatosplenomegaly Extremities: 2+ radial and pedal pulses bilaterally, intact motor and sensation bilateral UE and LE, no peripheral edema Skin: warm, dry, no rashes Psych: normal memory, normal mood/affect     Assessment/Plan:  PEH - NGT placement, no evidence of ischemia, d/w Dr. Stevie and plan for OR 8/25 FEN - NPO except sips/chips DVT - SCDs   Dispo - med-surg    Dreama GEANNIE Hanger, MD General and Trauma Surgery [facility] Surgery

## 2024-08-18 NOTE — ED Notes (Signed)
 Unsuccessful venipuncture, nurse will start IV and draw labs.  KM

## 2024-08-19 ENCOUNTER — Encounter (HOSPITAL_COMMUNITY): Payer: Self-pay | Admitting: Family Medicine

## 2024-08-19 ENCOUNTER — Observation Stay (HOSPITAL_COMMUNITY)

## 2024-08-19 DIAGNOSIS — Z83719 Family history of colon polyps, unspecified: Secondary | ICD-10-CM | POA: Diagnosis not present

## 2024-08-19 DIAGNOSIS — Z807 Family history of other malignant neoplasms of lymphoid, hematopoietic and related tissues: Secondary | ICD-10-CM | POA: Diagnosis not present

## 2024-08-19 DIAGNOSIS — K449 Diaphragmatic hernia without obstruction or gangrene: Secondary | ICD-10-CM | POA: Diagnosis not present

## 2024-08-19 DIAGNOSIS — I129 Hypertensive chronic kidney disease with stage 1 through stage 4 chronic kidney disease, or unspecified chronic kidney disease: Secondary | ICD-10-CM | POA: Diagnosis not present

## 2024-08-19 DIAGNOSIS — Z833 Family history of diabetes mellitus: Secondary | ICD-10-CM | POA: Diagnosis not present

## 2024-08-19 DIAGNOSIS — E039 Hypothyroidism, unspecified: Secondary | ICD-10-CM | POA: Diagnosis not present

## 2024-08-19 DIAGNOSIS — K440 Diaphragmatic hernia with obstruction, without gangrene: Secondary | ICD-10-CM

## 2024-08-19 DIAGNOSIS — Z91048 Other nonmedicinal substance allergy status: Secondary | ICD-10-CM | POA: Diagnosis not present

## 2024-08-19 DIAGNOSIS — Z4682 Encounter for fitting and adjustment of non-vascular catheter: Secondary | ICD-10-CM | POA: Diagnosis not present

## 2024-08-19 DIAGNOSIS — Z91040 Latex allergy status: Secondary | ICD-10-CM | POA: Diagnosis not present

## 2024-08-19 DIAGNOSIS — Z79899 Other long term (current) drug therapy: Secondary | ICD-10-CM | POA: Diagnosis not present

## 2024-08-19 DIAGNOSIS — I10 Essential (primary) hypertension: Secondary | ICD-10-CM | POA: Diagnosis present

## 2024-08-19 DIAGNOSIS — Z8379 Family history of other diseases of the digestive system: Secondary | ICD-10-CM | POA: Diagnosis not present

## 2024-08-19 DIAGNOSIS — N189 Chronic kidney disease, unspecified: Secondary | ICD-10-CM | POA: Diagnosis not present

## 2024-08-19 DIAGNOSIS — E7800 Pure hypercholesterolemia, unspecified: Secondary | ICD-10-CM | POA: Diagnosis present

## 2024-08-19 DIAGNOSIS — Z79890 Hormone replacement therapy (postmenopausal): Secondary | ICD-10-CM | POA: Diagnosis not present

## 2024-08-19 DIAGNOSIS — F419 Anxiety disorder, unspecified: Secondary | ICD-10-CM | POA: Diagnosis present

## 2024-08-19 DIAGNOSIS — F32A Depression, unspecified: Secondary | ICD-10-CM | POA: Diagnosis present

## 2024-08-19 DIAGNOSIS — Z885 Allergy status to narcotic agent status: Secondary | ICD-10-CM | POA: Diagnosis not present

## 2024-08-19 DIAGNOSIS — Z888 Allergy status to other drugs, medicaments and biological substances status: Secondary | ICD-10-CM | POA: Diagnosis not present

## 2024-08-19 DIAGNOSIS — K311 Adult hypertrophic pyloric stenosis: Secondary | ICD-10-CM | POA: Diagnosis present

## 2024-08-19 DIAGNOSIS — E890 Postprocedural hypothyroidism: Secondary | ICD-10-CM | POA: Diagnosis present

## 2024-08-19 DIAGNOSIS — K900 Celiac disease: Secondary | ICD-10-CM | POA: Diagnosis present

## 2024-08-19 DIAGNOSIS — Z96611 Presence of right artificial shoulder joint: Secondary | ICD-10-CM | POA: Diagnosis present

## 2024-08-19 DIAGNOSIS — J45909 Unspecified asthma, uncomplicated: Secondary | ICD-10-CM | POA: Diagnosis present

## 2024-08-19 DIAGNOSIS — Z8349 Family history of other endocrine, nutritional and metabolic diseases: Secondary | ICD-10-CM | POA: Diagnosis not present

## 2024-08-19 DIAGNOSIS — Z8249 Family history of ischemic heart disease and other diseases of the circulatory system: Secondary | ICD-10-CM | POA: Diagnosis not present

## 2024-08-19 LAB — BASIC METABOLIC PANEL WITH GFR
Anion gap: 7 (ref 5–15)
Anion gap: 8 (ref 5–15)
BUN: 18 mg/dL (ref 8–23)
BUN: 18 mg/dL (ref 8–23)
CO2: 28 mmol/L (ref 22–32)
CO2: 25 mmol/L (ref 22–32)
Calcium: 10.4 mg/dL — ABNORMAL HIGH (ref 8.9–10.3)
Calcium: 10.2 mg/dL (ref 8.9–10.3)
Chloride: 100 mmol/L (ref 98–111)
Chloride: 102 mmol/L (ref 98–111)
Creatinine, Ser: 1.25 mg/dL — ABNORMAL HIGH (ref 0.44–1.00)
Creatinine, Ser: 1.04 mg/dL — ABNORMAL HIGH (ref 0.44–1.00)
GFR, Estimated: 45 mL/min — ABNORMAL LOW
GFR, Estimated: 56 mL/min — ABNORMAL LOW (ref >60)
Glucose, Bld: 161 mg/dL — ABNORMAL HIGH (ref 70–99)
Glucose, Bld: 110 mg/dL — ABNORMAL HIGH (ref 70–99)
Potassium: 2.9 mmol/L — ABNORMAL LOW (ref 3.5–5.1)
Potassium: 3.7 mmol/L (ref 3.5–5.1)
Sodium: 135 mmol/L (ref 135–145)
Sodium: 135 mmol/L (ref 135–145)

## 2024-08-19 LAB — CBC
HCT: 33.5 % — ABNORMAL LOW (ref 36.0–46.0)
Hemoglobin: 11.6 g/dL — ABNORMAL LOW (ref 12.0–15.0)
MCH: 30.4 pg (ref 26.0–34.0)
MCHC: 34.6 g/dL (ref 30.0–36.0)
MCV: 87.7 fL (ref 80.0–100.0)
Platelets: 268 K/uL (ref 150–400)
RBC: 3.82 MIL/uL — ABNORMAL LOW (ref 3.87–5.11)
RDW: 12.9 % (ref 11.5–15.5)
WBC: 12.6 K/uL — ABNORMAL HIGH (ref 4.0–10.5)
nRBC: 0 % (ref 0.0–0.2)

## 2024-08-19 LAB — MAGNESIUM: Magnesium: 1.5 mg/dL — ABNORMAL LOW (ref 1.7–2.4)

## 2024-08-19 LAB — LIPASE, BLOOD: Lipase: 69 U/L — ABNORMAL HIGH (ref 11–51)

## 2024-08-19 MED ORDER — KCL IN DEXTROSE-NACL 20-5-0.45 MEQ/L-%-% IV SOLN
INTRAVENOUS | Status: DC
  Filled 2024-08-19 (×8): qty 1000

## 2024-08-19 MED ORDER — DEXTROSE-SODIUM CHLORIDE 5-0.45 % IV SOLN: INTRAVENOUS | Status: DC

## 2024-08-19 MED ORDER — ONDANSETRON HCL 4 MG PO TABS: 4.0000 mg | ORAL_TABLET | Freq: Four times a day (QID) | ORAL | Status: DC | PRN

## 2024-08-19 MED ORDER — ALBUTEROL SULFATE (2.5 MG/3ML) 0.083% IN NEBU: 2.5000 mg | INHALATION_SOLUTION | Freq: Three times a day (TID) | RESPIRATORY_TRACT | Status: DC | PRN

## 2024-08-19 MED ORDER — SODIUM CHLORIDE 0.9% FLUSH
3.0000 mL | Freq: Two times a day (BID) | INTRAVENOUS | Status: DC
  Administered 2024-08-19 – 2024-08-22 (×6): 3 mL via INTRAVENOUS

## 2024-08-19 MED ORDER — LEVOTHYROXINE SODIUM 100 MCG/5ML IV SOLN: 50.0000 ug | Freq: Every day | INTRAVENOUS | Status: DC

## 2024-08-19 MED ORDER — ENOXAPARIN SODIUM 40 MG/0.4ML IJ SOSY
40.0000 mg | PREFILLED_SYRINGE | INTRAMUSCULAR | Status: DC
  Administered 2024-08-19 – 2024-08-22 (×4): 40 mg via SUBCUTANEOUS
  Filled 2024-08-19 (×4): qty 0.4

## 2024-08-19 MED ORDER — PANTOPRAZOLE SODIUM 40 MG IV SOLR
40.0000 mg | Freq: Two times a day (BID) | INTRAVENOUS | Status: DC
  Administered 2024-08-19 – 2024-08-22 (×8): 40 mg via INTRAVENOUS
  Filled 2024-08-19 (×9): qty 10

## 2024-08-19 MED ORDER — POTASSIUM CHLORIDE 10 MEQ/100ML IV SOLN
10.0000 meq | INTRAVENOUS | Status: AC
  Administered 2024-08-19 (×4): 10 meq via INTRAVENOUS
  Filled 2024-08-19 (×4): qty 100

## 2024-08-19 MED ORDER — MAGNESIUM SULFATE 4 GM/100ML IV SOLN
4.0000 g | Freq: Once | INTRAVENOUS | Status: AC
  Administered 2024-08-19: 4 g via INTRAVENOUS
  Filled 2024-08-19: qty 100

## 2024-08-19 MED ORDER — HYDRALAZINE HCL 20 MG/ML IJ SOLN: 10.0000 mg | INTRAMUSCULAR | Status: DC | PRN

## 2024-08-19 MED ORDER — HYDROMORPHONE HCL 1 MG/ML IJ SOLN
0.5000 mg | INTRAMUSCULAR | Status: DC | PRN
  Administered 2024-08-19 – 2024-08-22 (×3): 0.5 mg via INTRAVENOUS
  Filled 2024-08-19 (×3): qty 0.5

## 2024-08-19 MED ORDER — METOPROLOL TARTRATE 5 MG/5ML IV SOLN: 5.0000 mg | INTRAVENOUS | Status: DC | PRN

## 2024-08-19 MED ORDER — ONDANSETRON HCL 4 MG/2ML IJ SOLN: 4.0000 mg | Freq: Four times a day (QID) | INTRAMUSCULAR | Status: DC | PRN

## 2024-08-19 MED ORDER — ONDANSETRON HCL 4 MG/2ML IJ SOLN
4.0000 mg | Freq: Four times a day (QID) | INTRAMUSCULAR | Status: DC | PRN
  Administered 2024-08-19: 4 mg via INTRAVENOUS
  Filled 2024-08-19: qty 2

## 2024-08-19 MED ORDER — DIAZEPAM 5 MG/ML IJ SOLN: 2.5000 mg | Freq: Four times a day (QID) | INTRAMUSCULAR | Status: DC | PRN

## 2024-08-19 NOTE — Plan of Care (Signed)
  Problem: Clinical Measurements: Goal: Ability to maintain clinical measurements within normal limits will improve Outcome: Progressing Goal: Will remain free from infection Outcome: Progressing Goal: Diagnostic test results will improve Outcome: Progressing   Problem: Coping: Goal: Level of anxiety will decrease Outcome: Progressing   Problem: Safety: Goal: Ability to remain free from injury will improve Outcome: Progressing

## 2024-08-19 NOTE — Plan of Care (Signed)
   Problem: Elimination: Goal: Will not experience complications related to bowel motility Outcome: Progressing   Problem: Pain Managment: Goal: General experience of comfort will improve and/or be controlled Outcome: Progressing   Problem: Safety: Goal: Ability to remain free from injury will improve Outcome: Progressing

## 2024-08-19 NOTE — Progress Notes (Signed)
 [facility] PLAN OF CARE NOTE Patient: ARISTEA POSADA FMW:[id number]   PCP: Nichole Senior, MD DOB: 1946/08/15   DOA: 08/18/2024   DOS: 08/19/2024    Patient was admitted by my colleague earlier on 08/19/2024. I have reviewed the H&P as well as assessment and plan and agree with the same. Important changes in the plan are listed below.  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 Replace potassium. Will perform further workup for calcium  levels. Switch to inpatient. Add Protonix . Add hydralazine  as needed for blood pressure. General surgery consulted.  Appreciate consult.  Level of care: Med-Surg  Author: Yetta Blanch, MD  [facility] 08/19/2024 6:03 PM   If 7PM-7AM, please contact night-coverage at [email or web address]

## 2024-08-19 NOTE — H&P (Signed)
 History and Physical    Patient: Felicia Washington DOB: Feb 01, 1946 DOA: 08/18/2024 DOS: the patient was seen and examined on 08/19/2024 PCP: Nichole Senior, MD  Patient coming from: Home  Chief Complaint: Chest pain, abdominal pain, vomiting  HPI: Felicia Washington is a 78 y.o. female with a history of GERD, HTN, HLD, hypothyroidism, Graves' disease, stroke who presented to the ED with abrupt onset of pain in the upper abdomen and lower chest that has worsened, constant, associated with frothy oral output, vomiting of stomach contents from breakfast many hours prior. She's had no dyspnea. Symptoms came on while sitting, not eating or drinking. Can't drink anything as it now comes right back up. No palpitations. No changes in bowel habits.   In the ED she has been afebrile with all vital signs wnl initially. 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    Review of Systems: As mentioned in the history of present illness. All other systems reviewed and are negative. Past Medical History:  Diagnosis Date   Anemia    Arthritis    fingers, shoulders (09/01/2018)   Asthma    Celiac disease    GERD (gastroesophageal reflux disease)    Graves disease    Heart murmur    History of blood transfusion    related to c-section   History of gout    fingers   HLD (hyperlipidemia)    HTN (hypertension)    Hypothyroidism    Migraine    1 q couple years (09/01/2018)   Pneumonia    several times in 1 yr (09/01/2018)   PONV (postoperative nausea and vomiting)    Seasonal allergies    Stroke [facility])    jan 2023   Past Surgical History:  Procedure Laterality Date   BUBBLE STUDY  09/23/2022   Procedure: BUBBLE STUDY;  Surgeon: Michele Richardson, DO;  Location: [facility];  Service: Cardiovascular;;   CESAREAN SECTION  1975   COLONOSCOPY     POLYPECTOMY     REVERSE  SHOULDER ARTHROPLASTY Right 09/01/2018   REVERSE SHOULDER ARTHROPLASTY Right 09/01/2018   Procedure: RIGHT REVERSE SHOULDER ARTHROPLASTY;  Surgeon: Melita Drivers, MD;  Location: [facility] OR;  Service: Orthopedics;  Laterality: Right;   SHOULDER ARTHROSCOPY W/ ROTATOR CUFF REPAIR Bilateral    TEE WITHOUT CARDIOVERSION N/A 09/23/2022   Procedure: TRANSESOPHAGEAL ECHOCARDIOGRAM (TEE);  Surgeon: Michele Richardson, DO;  Location: [facility]OS[facility];  Service: Cardiovascular;  Laterality: N/A;   THYROIDECTOMY     TONSILLECTOMY     Social History:  reports that she has never smoked. She has never used smokeless tobacco. She reports that she does not drink alcohol and does not use drugs.  Allergies  Allergen Reactions   Adhesive [Tape] Other (See Comments)    Paper tape only   Allopurinol  Swelling, Hives and Itching   Codeine Nausea Only   Latex Other (See Comments)    Removes skin    Family History  Problem Relation Age of Onset   Thyroid  disease Mother    Gallstones Mother    Multiple myeloma Father    Bone cancer Father    Celiac disease Sister    Colon polyps Sister    Colon polyps Brother    Diabetes Maternal Grandmother    Heart attack Maternal Grandfather    Sleep apnea Daughter     Prior to Admission medications   Medication Sig Start Date End  Date Taking? Authorizing Provider  acetaminophen  (TYLENOL ) 500 MG tablet Take 1,000 mg by mouth every 6 (six) hours as needed for mild pain (pain score 1-3).   Yes [provider]  albuterol  (VENTOLIN  HFA) 108 (90 Base) MCG/ACT inhaler Inhale 2 puffs into the lungs every 8 (eight) hours as needed for shortness of breath or wheezing. 06/19/24  Yes [provider]  amLODipine  (NORVASC ) 10 MG tablet Take 1 tablet (10 mg total) by mouth every morning. Patient taking differently: Take 10 mg by mouth at bedtime. 11/24/23 11/18/24 Yes Ladona Heinz, MD  aspirin  EC 81 MG tablet Take 81 mg by mouth in the morning. Swallow whole.   Yes [provider]  atorvastatin  (LIPITOR) 40 MG tablet Take 40 mg by mouth in the morning.   Yes [provider]  buPROPion  (WELLBUTRIN  XL) 150 MG 24 hr tablet Take 150 mg by mouth in the morning.   Yes [provider]  chlorthalidone  (HYGROTON ) 25 MG tablet Take 25 mg by mouth every morning. 05/16/24  Yes [provider]  clonazePAM (KLONOPIN) 0.5 MG tablet Take 0.5 mg by mouth at bedtime. 02/04/22  Yes [provider]  famotidine  (PEPCID ) 20 MG tablet Take 20 mg by mouth at bedtime.   Yes [provider]  Febuxostat  80 MG TABS Take 80 mg by mouth at bedtime.   Yes [provider]  fenofibrate  micronized (LOFIBRA) 67 MG capsule Take 67 mg by mouth in the morning. 08/08/18  Yes [provider]  levothyroxine  (SYNTHROID , LEVOTHROID) 112 MCG tablet Take 56-224 mcg by mouth See admin instructions. Take 1 tablets (112 mcg) by mouth on Mondays through Saturdays before breakfast. Take 2tablest (224 mcg) by mouth on Sundays before breakfast.   Yes [provider]  metoprolol  succinate (TOPROL  XL) 50 MG 24 hr tablet Take 1 tablet (50 mg total) by mouth at bedtime. 11/11/23  Yes Conte, Tessa N, PA-C  montelukast  (SINGULAIR ) 10 MG tablet Take 10 mg by mouth at bedtime.   Yes [provider]  olmesartan  (BENICAR ) 40 MG tablet Take 1 tablet (40 mg total) by mouth every evening. 11/11/23  Yes Conte, Tessa N, PA-C  potassium chloride  (KLOR-CON  M) 10 MEQ tablet Take 10 mEq by mouth daily with breakfast. 05/17/24  Yes [provider]  buPROPion  (WELLBUTRIN ) 75 MG tablet Take 75 mg by mouth daily. Patient not taking: Reported on 08/18/2024 07/10/24   [provider]    Physical Exam: Vitals:   08/18/24 1758  BP: 134/73  Pulse: 83  Resp: 16  Temp: 97.7 F (36.5 C)  TempSrc: Oral  SpO2: 100%  Gen: Elderly female uncomfortable but in no distress HEENT: NG tube in place with dark output in tube.  Pulm: Clear, nonlabored   CV: RRR, no MRG. +LE pitting edema, trivial.  GI: Soft, NT, ND, +BS Neuro: Alert and oriented. No new focal deficits. Ext: Warm, no deformities. Skin: No new rashes, lesions or ulcers on visualized skin   Data Reviewed: Cr 1.26, BUN 18, glucose 177. Calcium  10.6 CBC normal LFTs normal, lipase 272.  Troponin 5 ECG: NSR with PACS UA +protein, ketones, glucose, negative micro.  CXR: Moderate-to-large hiatal hernia.  CTA chest, CT abd/pelvis: 1. 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 Fluid dilated esophageal and gastric lumen. No definite findings of bowel ischemia or perforation. Recommend enteric tube placement. Recommend emergent surgical consultation. 2. No pulmonary embolus. 3. Enlarged main pulmonary artery-correlate for pulmonary hypertension.  4. Cholelithiasis with no acute cholecystitis. 5. Colonic diverticulosis with no acute diverticulitis. Aortic Atherosclerosis (ICD10-I70.0).  Assessment and Plan: Paraesophageal gastric hernia with gastric obstruction: fluid-filled esophagus/stomach. No evidence of ischemia or perforation.  - Surgery consulted, recommend NG tube decompression, continue to LIWS. VSS. Formal consult pending.  - IVF, IV antiemetic - Give PPI IV BID  HTN:  - Hold norvasc , olmesartan , metoprolol , will order prn metoprolol   History of cryptogenic stroke, HLD: Bilateral cortical infarcts concerning for embolic phenomenon, loop record still in situ.  - Holding ASA, statin  Hypercalcemia:  - Recheck after IVF. Check ionized calcium , further work up will be pending confirmation.   Hypothyroidism:  - If unable to restart po, would substitute with IV synthroid  at half dose.   Asthma: Quiescent - Hold singulair , continue prn albuterol   Depression/anxiety:  - Hold bupropion , clonazepam, will substitute low dose diazepam  IV prn   Advance Care Planning: Full code  Consults: General  surgery  Family Communication: Daughter at bedside  Severity of Illness: The appropriate patient status for this patient is OBSERVATION. Observation status is judged to be reasonable and necessary in order to provide the required intensity of service to ensure the patient's safety. The patient's presenting symptoms, physical exam findings, and initial radiographic and laboratory data in the context of their medical condition is felt to place them at decreased risk for further clinical deterioration. Furthermore, it is anticipated that the patient will be medically stable for discharge from the hospital within 2 midnights of admission.   Author: Bernardino KATHEE Come, MD 08/19/2024 12:31 AM  For on call review [email or web address].

## 2024-08-19 NOTE — Care Management Obs Status (Signed)
 MEDICARE OBSERVATION STATUS NOTIFICATION   Patient Details  Name: Felicia Washington MRN: [id number] Date of Birth: February 19, 1946   Medicare Observation Status Notification Given:  Yes    Jon Cruel 08/19/2024, 11:00 AM

## 2024-08-20 DIAGNOSIS — K440 Diaphragmatic hernia with obstruction, without gangrene: Secondary | ICD-10-CM | POA: Diagnosis not present

## 2024-08-20 LAB — COMPREHENSIVE METABOLIC PANEL WITH GFR
ALT: 16 U/L (ref 0–44)
AST: 27 U/L (ref 15–41)
Albumin: 2.8 g/dL — ABNORMAL LOW (ref 3.5–5.0)
Alkaline Phosphatase: 33 U/L — ABNORMAL LOW (ref 38–126)
Anion gap: 5 (ref 5–15)
BUN: 14 mg/dL (ref 8–23)
CO2: 25 mmol/L (ref 22–32)
Calcium: 9.5 mg/dL (ref 8.9–10.3)
Chloride: 104 mmol/L (ref 98–111)
Creatinine, Ser: 1.06 mg/dL — ABNORMAL HIGH (ref 0.44–1.00)
GFR, Estimated: 54 mL/min — ABNORMAL LOW (ref >60)
Glucose, Bld: 111 mg/dL — ABNORMAL HIGH (ref 70–99)
Potassium: 3.8 mmol/L (ref 3.5–5.1)
Sodium: 134 mmol/L — ABNORMAL LOW (ref 135–145)
Total Bilirubin: 0.6 mg/dL (ref 0.0–1.2)
Total Protein: 5.3 g/dL — ABNORMAL LOW (ref 6.5–8.1)

## 2024-08-20 LAB — CBC WITH DIFFERENTIAL/PLATELET
Abs Immature Granulocytes: 0.05 K/uL (ref 0.00–0.07)
Basophils Absolute: 0 K/uL (ref 0.0–0.1)
Basophils Relative: 0 %
Eosinophils Absolute: 0.1 K/uL (ref 0.0–0.5)
Eosinophils Relative: 1 %
HCT: 29.4 % — ABNORMAL LOW (ref 36.0–46.0)
Hemoglobin: 10 g/dL — ABNORMAL LOW (ref 12.0–15.0)
Immature Granulocytes: 0 %
Lymphocytes Relative: 14 %
Lymphs Abs: 1.6 K/uL (ref 0.7–4.0)
MCH: 30.4 pg (ref 26.0–34.0)
MCHC: 34 g/dL (ref 30.0–36.0)
MCV: 89.4 fL (ref 80.0–100.0)
Monocytes Absolute: 1 K/uL (ref 0.1–1.0)
Monocytes Relative: 8 %
Neutro Abs: 8.7 K/uL — ABNORMAL HIGH (ref 1.7–7.7)
Neutrophils Relative %: 77 %
Platelets: 204 K/uL (ref 150–400)
RBC: 3.29 MIL/uL — ABNORMAL LOW (ref 3.87–5.11)
RDW: 13 % (ref 11.5–15.5)
WBC: 11.5 K/uL — ABNORMAL HIGH (ref 4.0–10.5)
nRBC: 0 % (ref 0.0–0.2)

## 2024-08-20 LAB — VITAMIN D 25 HYDROXY (VIT D DEFICIENCY, FRACTURES): Vit D, 25-Hydroxy: 23.33 ng/mL — ABNORMAL LOW (ref 30–100)

## 2024-08-20 LAB — MAGNESIUM: Magnesium: 2 mg/dL (ref 1.7–2.4)

## 2024-08-20 MED ORDER — CELECOXIB 200 MG PO CAPS: 200.0000 mg | ORAL_CAPSULE | ORAL | Status: DC

## 2024-08-20 MED ORDER — CEFAZOLIN SODIUM-DEXTROSE 2-4 GM/100ML-% IV SOLN
2.0000 g | INTRAVENOUS | Status: AC
  Administered 2024-08-21: 2 g via INTRAVENOUS
  Filled 2024-08-20: qty 100

## 2024-08-20 MED ORDER — CELECOXIB 200 MG PO CAPS
200.0000 mg | ORAL_CAPSULE | ORAL | Status: AC
  Administered 2024-08-21: 200 mg
  Filled 2024-08-20: qty 1

## 2024-08-20 MED ORDER — ACETAMINOPHEN 325 MG PO TABS
650.0000 mg | ORAL_TABLET | ORAL | Status: AC
  Administered 2024-08-21: 650 mg
  Filled 2024-08-20: qty 2

## 2024-08-20 NOTE — Progress Notes (Signed)
 [facility] Progress Note Patient: Felicia Washington FMW:[id number] DOB: 06-15-1946 DOA: 08/18/2024  DOS: the patient was seen and examined on 08/20/2024  Brief Hospital Course: Felicia Washington is a 78 y.o. female with a history of GERD, HTN, HLD, hypothyroidism, Graves' disease, stroke who presented to the ED with abrupt onset of pain in the upper abdomen and lower chest that has worsened, constant, associated with frothy oral output, vomiting of stomach contents from breakfast many hours prior. She's had no dyspnea. Symptoms came on while sitting, not eating or drinking. Can't drink anything as it now comes right back up. No palpitations. No changes in bowel habits.  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 Currently scheduled for surgery on 8/25. Assessment and Plan: Paraesophageal gastric hernia with gastric obstruction fluid-filled esophagus/stomach.  No evidence of ischemia or perforation.  General surgery consulted. Currently NG tube for decompression. Maintain K more than 4, mag more than 2. Scheduled for surgery on 8/25. Continue PPI twice daily. Continue IV fluid.   HTN:  Hold norvasc , olmesartan , metoprolol , will order prn metoprolol    History of cryptogenic stroke, HLD Bilateral cortical infarcts concerning for embolic phenomenon, loop record still in situ.  Holding ASA, statin Has intermittent confusion.   Hypercalcemia:  Remains elevated but now improving with aggressive IV hydration. Vitamin D  low.  PTH pending. Monitor.   Hypothyroidism:  If unable to restart po, would substitute with IV synthroid  at half dose.    Asthma:  Quiescent - Hold singulair , continue prn albuterol    Depression/anxiety:  - Hold bupropion , clonazepam, will substitute low dose diazepam  IV prn  Elevated lipase. Lipase level was elevated more than 200. Currently improving. CT scan negative for any pancreatic involvement. Suspect this is in  setting of bowel obstruction.   Subjective: Denies any acute complaint.  No nausea no vomiting fever no chills.  Physical Exam: Clear to auscultation. S1-S2 present. Sluggish bowel sound. No edema. No asterixis.  Data Reviewed: I have Reviewed nursing notes, Vitals, and Lab results. Since last encounter, pertinent lab results CBC and BMP   . I have ordered test including CBC and BMP  .   Disposition: Status is: Inpatient Remains inpatient appropriate because: Monitor for improvement in obstruction  enoxaparin  (LOVENOX ) injection 40 mg Start: 08/19/24 0200    Family Communication: Family at bedside Level of care: Med-Surg   Vitals:   08/19/24 1957 08/20/24 0418 08/20/24 0825 08/20/24 1632  BP: 139/80 123/70 130/85 (!) 147/71  Pulse: 70 81 90 79  Resp: 17 17 15 15   Temp: 98.2 F (36.8 C) 98.7 F (37.1 C) 98.3 F (36.8 C) 98.3 F (36.8 C)  TempSrc: Oral Oral Oral Oral  SpO2: 98% 97% 98% 99%  Weight:      Height:         Author: Yetta Blanch, MD 08/20/2024 6:43 PM  Please look on [email or web address] to find out who is on call.

## 2024-08-20 NOTE — Plan of Care (Signed)

## 2024-08-20 NOTE — Hospital Course (Addendum)
 Felicia Washington is a 78 y.o. female with a history of GERD, HTN, HLD, hypothyroidism, Graves' disease, stroke who presented to the ED with abrupt onset of pain in the upper abdomen and lower chest that has worsened, constant, associated with frothy oral output, vomiting of stomach contents from breakfast many hours prior. She's had no dyspnea. Symptoms came on while sitting, not eating or drinking. Can't drink anything as it now comes right back up. No palpitations. No changes in bowel habits.  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 Underwent fundoplication, hernia repair and G-tube insertion on 8/25.  Assessment and Plan: Paraesophageal gastric hernia with gastric obstruction fluid-filled esophagus/stomach.  No evidence of ischemia or perforation.  General surgery consulted. Currently NG tube for decompression. Maintain K more than 4, mag more than 2. Underwent : laparoscopic paraesophageal hernia repair, laparoscopic Toupet fundoplication, laparoscopic g tube insertion surgery on 8/25.   Continuing clear liquid diet. Continue PPI twice daily. Continue IV fluid.   HTN:  Hold norvasc , olmesartan , metoprolol , will order prn metoprolol    History of cryptogenic stroke, HLD Bilateral cortical infarcts concerning for embolic phenomenon, loop record still in situ.  Holding ASA, statin Has intermittent confusion.   Hypercalcemia:  Remains elevated but now improving with aggressive IV hydration. Vitamin D  low.  PTH pending. Monitor.   Hypothyroidism:  If unable to restart po, would substitute with IV synthroid  at half dose.  Currently scheduled initiated 8/28.   Asthma:  No wheezing so far. Hold singulair , continue prn albuterol    Depression/anxiety:  Hold bupropion , clonazepam, will substitute low dose diazepam  IV prn  Elevated lipase. Lipase level was elevated more than 200. Currently improving. CT scan negative for any pancreatic  involvement. Suspect this is in setting of bowel obstruction.

## 2024-08-20 NOTE — Plan of Care (Signed)
  Problem: Clinical Measurements: Goal: Will remain free from infection Outcome: Progressing Goal: Diagnostic test results will improve Outcome: Progressing   Problem: Activity: Goal: Risk for activity intolerance will decrease Outcome: Progressing   Problem: Pain Managment: Goal: General experience of comfort will improve and/or be controlled Outcome: Progressing   Problem: Skin Integrity: Goal: Risk for impaired skin integrity will decrease Outcome: Progressing

## 2024-08-20 NOTE — Anesthesia Preprocedure Evaluation (Signed)
 Anesthesia Evaluation  Patient identified by MRN, date of birth, ID band Patient awake    Reviewed: Allergy & Precautions, NPO status , Patient's Chart, lab work & pertinent test results, reviewed documented beta blocker date and time   History of Anesthesia Complications (+) PONV and history of anesthetic complications  Airway Mallampati: II  TM Distance: >3 FB Neck ROM: Full    Dental  (+) Dental Advisory Given, Chipped,    Pulmonary asthma    Pulmonary exam normal breath sounds clear to auscultation       Cardiovascular hypertension, Pt. on medications and Pt. on home beta blockers Normal cardiovascular exam Rhythm:Regular Rate:Normal  TEE 2023 1. Left ventricular ejection fraction, by estimation, is 60 to 65%. The  left ventricle has normal function. The left ventricle has no regional  wall motion abnormalities. Left ventricular diastolic function could not  be evaluated. Elevated left atrial  pressure.   2. Right ventricular systolic function is normal. The right ventricular  size is normal.   3. Left atrial size was visually appears moderate to severe dilatation.  No left atrial/left atrial appendage thrombus was detected. The LAA  emptying velocity was 17 cm/s.   4. The mitral valve is normal in structure. Mild mitral valve  regurgitation. No evidence of mitral stenosis.   5. Tricuspid valve regurgitation is mild to moderate.   6. The aortic valve is normal in structure. Aortic valve regurgitation is  not visualized. No aortic stenosis is present.   7. Evidence of atrial level shunting detected by color flow Doppler.  Agitated saline contrast bubble study was positive with shunting observed  within 3-6 cardiac cycles suggestive of interatrial shunt. There is a  patent foramen ovale with predominantly  right to left shunting across the atrial septum.     Neuro/Psych  Headaches CVA, No Residual Symptoms  negative  psych ROS   GI/Hepatic Neg liver ROS, hiatal hernia,GERD  ,,  Endo/Other  Hypothyroidism    Renal/GU negative Renal ROS  negative genitourinary   Musculoskeletal  (+) Arthritis ,    Abdominal   Peds  Hematology  (+) Blood dyscrasia, anemia   Anesthesia Other Findings   Reproductive/Obstetrics                              Anesthesia Physical Anesthesia Plan  ASA: 3  Anesthesia Plan: General   Post-op Pain Management:    Induction: Intravenous  PONV Risk Score and Plan: 4 or greater and Dexamethasone , Ondansetron  and Treatment may vary due to age or medical condition  Airway Management Planned: Oral ETT  Additional Equipment:   Intra-op Plan:   Post-operative Plan: Extubation in OR  Informed Consent: I have reviewed the patients History and Physical, chart, labs and discussed the procedure including the risks, benefits and alternatives for the proposed anesthesia with the patient or authorized representative who has indicated his/her understanding and acceptance.     Dental advisory given  Plan Discussed with: CRNA  Anesthesia Plan Comments:          Anesthesia Quick Evaluation

## 2024-08-21 ENCOUNTER — Inpatient Hospital Stay (HOSPITAL_COMMUNITY): Payer: Self-pay | Admitting: Anesthesiology

## 2024-08-21 ENCOUNTER — Other Ambulatory Visit: Payer: Self-pay

## 2024-08-21 ENCOUNTER — Encounter (HOSPITAL_COMMUNITY)
Admission: EM | Disposition: A | Discharge status: Home / Self Care | Payer: Self-pay | Source: Home / Self Care | Attending: Internal Medicine

## 2024-08-21 ENCOUNTER — Encounter (HOSPITAL_COMMUNITY): Payer: Self-pay | Admitting: Internal Medicine

## 2024-08-21 DIAGNOSIS — I129 Hypertensive chronic kidney disease with stage 1 through stage 4 chronic kidney disease, or unspecified chronic kidney disease: Secondary | ICD-10-CM

## 2024-08-21 DIAGNOSIS — E039 Hypothyroidism, unspecified: Secondary | ICD-10-CM | POA: Diagnosis not present

## 2024-08-21 DIAGNOSIS — N189 Chronic kidney disease, unspecified: Secondary | ICD-10-CM | POA: Diagnosis not present

## 2024-08-21 DIAGNOSIS — K440 Diaphragmatic hernia with obstruction, without gangrene: Secondary | ICD-10-CM | POA: Diagnosis not present

## 2024-08-21 DIAGNOSIS — K449 Diaphragmatic hernia without obstruction or gangrene: Secondary | ICD-10-CM

## 2024-08-21 HISTORY — PX: HIATAL HERNIA REPAIR: SHX195

## 2024-08-21 HISTORY — PX: LAPAROSCOPIC GASTROSTOMY: SHX5896

## 2024-08-21 LAB — COMPREHENSIVE METABOLIC PANEL WITH GFR
ALT: 45 U/L — ABNORMAL HIGH (ref 0–44)
AST: 68 U/L — ABNORMAL HIGH (ref 15–41)
Albumin: 3.5 g/dL (ref 3.5–5.0)
Alkaline Phosphatase: 38 U/L (ref 38–126)
Anion gap: 11 (ref 5–15)
BUN: 11 mg/dL (ref 8–23)
CO2: 23 mmol/L (ref 22–32)
Calcium: 9.5 mg/dL (ref 8.9–10.3)
Chloride: 99 mmol/L (ref 98–111)
Creatinine, Ser: 1.11 mg/dL — ABNORMAL HIGH (ref 0.44–1.00)
GFR, Estimated: 52 mL/min — ABNORMAL LOW (ref >60)
Glucose, Bld: 255 mg/dL — ABNORMAL HIGH (ref 70–99)
Potassium: 4 mmol/L (ref 3.5–5.1)
Sodium: 133 mmol/L — ABNORMAL LOW (ref 135–145)
Total Bilirubin: 0.9 mg/dL (ref 0.0–1.2)
Total Protein: 6.1 g/dL — ABNORMAL LOW (ref 6.5–8.1)

## 2024-08-21 LAB — CBC WITH DIFFERENTIAL/PLATELET
Abs Immature Granulocytes: 0.05 K/uL (ref 0.00–0.07)
Basophils Absolute: 0 K/uL (ref 0.0–0.1)
Basophils Relative: 0 %
Eosinophils Absolute: 0 K/uL (ref 0.0–0.5)
Eosinophils Relative: 0 %
HCT: 31.2 % — ABNORMAL LOW (ref 36.0–46.0)
Hemoglobin: 10.2 g/dL — ABNORMAL LOW (ref 12.0–15.0)
Immature Granulocytes: 1 %
Lymphocytes Relative: 4 %
Lymphs Abs: 0.3 K/uL — ABNORMAL LOW (ref 0.7–4.0)
MCH: 29.8 pg (ref 26.0–34.0)
MCHC: 32.7 g/dL (ref 30.0–36.0)
MCV: 91.2 fL (ref 80.0–100.0)
Monocytes Absolute: 0.3 K/uL (ref 0.1–1.0)
Monocytes Relative: 4 %
Neutro Abs: 7.8 K/uL — ABNORMAL HIGH (ref 1.7–7.7)
Neutrophils Relative %: 91 %
Platelets: 180 K/uL (ref 150–400)
RBC: 3.42 MIL/uL — ABNORMAL LOW (ref 3.87–5.11)
RDW: 12.3 % (ref 11.5–15.5)
WBC: 8.5 K/uL (ref 4.0–10.5)
nRBC: 0 % (ref 0.0–0.2)

## 2024-08-21 LAB — CALCIUM, IONIZED: Calcium, Ionized, Serum: 5.7 mg/dL — ABNORMAL HIGH (ref 4.5–5.6)

## 2024-08-21 LAB — MAGNESIUM: Magnesium: 1.5 mg/dL — ABNORMAL LOW (ref 1.7–2.4)

## 2024-08-21 SURGERY — REPAIR, HERNIA, HIATAL, LAPAROSCOPIC
Anesthesia: General | Site: Abdomen

## 2024-08-21 MED ORDER — SODIUM CHLORIDE 0.9 % IR SOLN
Status: DC | PRN
  Administered 2024-08-21: 500 mL

## 2024-08-21 MED ORDER — OXYCODONE HCL 5 MG PO TABS
ORAL_TABLET | ORAL | Status: AC
  Filled 2024-08-21: qty 1

## 2024-08-21 MED ORDER — ROCURONIUM BROMIDE 10 MG/ML (PF) SYRINGE
PREFILLED_SYRINGE | INTRAVENOUS | Status: DC | PRN
  Administered 2024-08-21: 40 mg via INTRAVENOUS
  Administered 2024-08-21: 20 mg via INTRAVENOUS
  Administered 2024-08-21: 10 mg via INTRAVENOUS

## 2024-08-21 MED ORDER — PROPOFOL 10 MG/ML IV BOLUS
INTRAVENOUS | Status: DC | PRN
  Administered 2024-08-21: 100 mg via INTRAVENOUS

## 2024-08-21 MED ORDER — FENTANYL CITRATE (PF) 100 MCG/2ML IJ SOLN: 25.0000 ug | INTRAMUSCULAR | Status: DC | PRN

## 2024-08-21 MED ORDER — LIDOCAINE 2% (20 MG/ML) 5 ML SYRINGE
INTRAMUSCULAR | Status: DC | PRN
  Administered 2024-08-21: 40 mg via INTRAVENOUS

## 2024-08-21 MED ORDER — AMISULPRIDE (ANTIEMETIC) 5 MG/2ML IV SOLN: 10.0000 mg | Freq: Once | INTRAVENOUS | Status: DC | PRN

## 2024-08-21 MED ORDER — FENTANYL CITRATE (PF) 250 MCG/5ML IJ SOLN
INTRAMUSCULAR | Status: AC
  Filled 2024-08-21: qty 5

## 2024-08-21 MED ORDER — ALBUMIN HUMAN 5 % IV SOLN: INTRAVENOUS | Status: DC | PRN

## 2024-08-21 MED ORDER — ONDANSETRON HCL 4 MG/2ML IJ SOLN
INTRAMUSCULAR | Status: DC | PRN
  Administered 2024-08-21: 4 mg via INTRAVENOUS

## 2024-08-21 MED ORDER — FENTANYL CITRATE (PF) 250 MCG/5ML IJ SOLN
INTRAMUSCULAR | Status: DC | PRN
  Administered 2024-08-21 (×4): 50 ug via INTRAVENOUS

## 2024-08-21 MED ORDER — PHENYLEPHRINE 80 MCG/ML (10ML) SYRINGE FOR IV PUSH (FOR BLOOD PRESSURE SUPPORT)
PREFILLED_SYRINGE | INTRAVENOUS | Status: DC | PRN
  Administered 2024-08-21: 80 ug via INTRAVENOUS
  Administered 2024-08-21: 160 ug via INTRAVENOUS

## 2024-08-21 MED ORDER — OXYCODONE HCL 5 MG PO TABS
5.0000 mg | ORAL_TABLET | Freq: Once | ORAL | Status: AC | PRN
  Administered 2024-08-21: 5 mg

## 2024-08-21 MED ORDER — CHLORHEXIDINE GLUCONATE 0.12 % MT SOLN
15.0000 mL | Freq: Once | OROMUCOSAL | Status: AC
Start: 2024-08-21 — End: 2024-08-21
  Administered 2024-08-21: 15 mL via OROMUCOSAL

## 2024-08-21 MED ORDER — OXYCODONE HCL 5 MG/5ML PO SOLN: 5.0000 mg | Freq: Once | ORAL | Status: AC | PRN

## 2024-08-21 MED ORDER — LACTATED RINGERS IV SOLN: INTRAVENOUS | Status: DC

## 2024-08-21 MED ORDER — PHENYLEPHRINE HCL-NACL 20-0.9 MG/250ML-% IV SOLN
INTRAVENOUS | Status: DC | PRN
  Administered 2024-08-21: 50 ug/min via INTRAVENOUS

## 2024-08-21 MED ORDER — MAGNESIUM SULFATE 2 GM/50ML IV SOLN
2.0000 g | Freq: Once | INTRAVENOUS | Status: AC
  Administered 2024-08-21: 2 g via INTRAVENOUS
  Filled 2024-08-21: qty 50

## 2024-08-21 MED ORDER — 0.9 % SODIUM CHLORIDE (POUR BTL) OPTIME
TOPICAL | Status: DC | PRN
  Administered 2024-08-21: 650 mL

## 2024-08-21 MED ORDER — EPHEDRINE SULFATE-NACL 50-0.9 MG/10ML-% IV SOSY
PREFILLED_SYRINGE | INTRAVENOUS | Status: DC | PRN
  Administered 2024-08-21 (×2): 5 mg via INTRAVENOUS

## 2024-08-21 MED ORDER — SUGAMMADEX SODIUM 200 MG/2ML IV SOLN
INTRAVENOUS | Status: DC | PRN
  Administered 2024-08-21: 200 mg via INTRAVENOUS

## 2024-08-21 MED ORDER — DEXMEDETOMIDINE HCL IN NACL 80 MCG/20ML IV SOLN
INTRAVENOUS | Status: DC | PRN
  Administered 2024-08-21: 8 ug via INTRAVENOUS

## 2024-08-21 MED ORDER — OXYCODONE HCL 5 MG PO TABS
5.0000 mg | ORAL_TABLET | ORAL | Status: DC | PRN
  Administered 2024-08-21 – 2024-08-22 (×3): 5 mg via ORAL
  Filled 2024-08-21 (×3): qty 1

## 2024-08-21 MED ORDER — PROPOFOL 10 MG/ML IV BOLUS
INTRAVENOUS | Status: AC
  Filled 2024-08-21: qty 20

## 2024-08-21 MED ORDER — BUPIVACAINE-EPINEPHRINE (PF) 0.25% -1:200000 IJ SOLN
INTRAMUSCULAR | Status: AC
  Filled 2024-08-21: qty 30

## 2024-08-21 MED ORDER — ORAL CARE MOUTH RINSE: 15.0000 mL | Freq: Once | OROMUCOSAL | Status: AC

## 2024-08-21 MED ORDER — DEXAMETHASONE SODIUM PHOSPHATE 10 MG/ML IJ SOLN
INTRAMUSCULAR | Status: DC | PRN
  Administered 2024-08-21: 10 mg via INTRAVENOUS

## 2024-08-21 MED ORDER — SUCCINYLCHOLINE CHLORIDE 200 MG/10ML IV SOSY
PREFILLED_SYRINGE | INTRAVENOUS | Status: DC | PRN
  Administered 2024-08-21: 100 mg via INTRAVENOUS

## 2024-08-21 MED ORDER — BUPIVACAINE-EPINEPHRINE 0.25% -1:200000 IJ SOLN
INTRAMUSCULAR | Status: DC | PRN
  Administered 2024-08-21: 30 mL

## 2024-08-21 SURGICAL SUPPLY — 63 items
BAG COUNTER SPONGE SURGICOUNT (BAG) ×2 IMPLANT
BAG URINE DRAIN 2000ML AR STRL (UROLOGICAL SUPPLIES) IMPLANT
BLADE CLIPPER SURG (BLADE) IMPLANT
CANISTER SUCTION 3000ML PPV (SUCTIONS) ×2 IMPLANT
CHLORAPREP W/TINT 26 (MISCELLANEOUS) ×2 IMPLANT
CLAMP ENDO BABCK 10MM (STAPLE) ×2 IMPLANT
CLIP APPLIE ROT 10 11.4 M/L (STAPLE) IMPLANT
COVER SURGICAL LIGHT HANDLE (MISCELLANEOUS) ×2 IMPLANT
DERMABOND ADVANCED .7 DNX12 (GAUZE/BANDAGES/DRESSINGS) ×2 IMPLANT
DEVICE SUTURE ENDOST 10MM (ENDOMECHANICALS) ×2 IMPLANT
DISSECTOR BLUNT TIP ENDO 5MM (MISCELLANEOUS) IMPLANT
DRAIN PENROSE 0.5X18 (DRAIN) ×2 IMPLANT
DRAIN PENROSE LF 8X20.3CM SIL (WOUND CARE) IMPLANT
DRAPE LAPAROSCOPIC ABDOMINAL (DRAPES) ×4 IMPLANT
ELECTRODE REM PT RTRN 9FT ADLT (ELECTROSURGICAL) ×2 IMPLANT
G-TUBE MIC BOLUS 18FR ENFIT (TUBING) IMPLANT
GLOVE BIOGEL M 8.0 STRL (GLOVE) ×2 IMPLANT
GLOVE BIOGEL PI IND STRL 7.0 (GLOVE) ×2 IMPLANT
GLOVE SURG SS PI 7.0 STRL IVOR (GLOVE) ×2 IMPLANT
GOWN STRL REUS W/ TWL LRG LVL3 (GOWN DISPOSABLE) ×6 IMPLANT
GOWN STRL REUS W/ TWL XL LVL3 (GOWN DISPOSABLE) ×2 IMPLANT
GRASPER SUT TROCAR 14GX15 (MISCELLANEOUS) ×2 IMPLANT
IRRIGATION SUCT STRKRFLW 2 WTP (MISCELLANEOUS) ×2 IMPLANT
KIT BASIN OR (CUSTOM PROCEDURE TRAY) ×2 IMPLANT
KIT TURNOVER KIT B (KITS) ×2 IMPLANT
NDL 22X1.5 STRL (OR ONLY) (MISCELLANEOUS) ×2 IMPLANT
NDL HYPO 22X1.5 SAFETY MO (MISCELLANEOUS) ×2 IMPLANT
NEEDLE 22X1.5 STRL (OR ONLY) (MISCELLANEOUS) ×1 IMPLANT
NEEDLE HYPO 22X1.5 SAFETY MO (MISCELLANEOUS) ×1 IMPLANT
NS IRRIG 1000ML POUR BTL (IV SOLUTION) ×2 IMPLANT
PAD ARMBOARD POSITIONER FOAM (MISCELLANEOUS) ×4 IMPLANT
PENCIL BUTTON HOLSTER BLD 10FT (ELECTRODE) IMPLANT
PLUG CATH AND CAP STRL 200 (CATHETERS) IMPLANT
POUCH LAPAROSCOPIC INSTRUMENT (MISCELLANEOUS) ×2 IMPLANT
SCISSORS LAP 5X35 DISP (ENDOMECHANICALS) ×2 IMPLANT
SET TUBE SMOKE EVAC HIGH FLOW (TUBING) ×2 IMPLANT
SHEARS HARMONIC 36 ACE (MISCELLANEOUS) ×2 IMPLANT
SLEEVE ADV FIXATION 5X100MM (TROCAR) IMPLANT
SLEEVE Z-THREAD 5X100MM (TROCAR) ×6 IMPLANT
SPIKE FLUID TRANSFER (MISCELLANEOUS) ×2 IMPLANT
SPONGE DRAIN TRACH 4X4 STRL 2S (GAUZE/BANDAGES/DRESSINGS) IMPLANT
STAPLER SKIN PROX 35W (STAPLE) ×2 IMPLANT
SUT ETHIBOND X763 2 0 SH 1 (SUTURE) IMPLANT
SUT ETHILON 2 0 FS 18 (SUTURE) IMPLANT
SUT MNCRL AB 4-0 PS2 18 (SUTURE) ×2 IMPLANT
SUT SILK 2 0 SH (SUTURE) ×8 IMPLANT
SUT SILK 2 0 SH CR/8 (SUTURE) ×2 IMPLANT
SUT SURGIDAC NAB ES-9 0 48 120 (SUTURE) IMPLANT
SUT VICRYL 0 UR6 27IN ABS (SUTURE) IMPLANT
TIP INNERVISION DETACH 40FR (MISCELLANEOUS) IMPLANT
TIP INNERVISION DETACH 50FR (MISCELLANEOUS) IMPLANT
TIP INNERVISION DETACH 56FR (MISCELLANEOUS) IMPLANT
TOWEL GREEN STERILE (TOWEL DISPOSABLE) ×2 IMPLANT
TOWEL GREEN STERILE FF (TOWEL DISPOSABLE) ×2 IMPLANT
TRAY FOLEY MTR SLVR 14FR STAT (SET/KITS/TRAYS/PACK) ×2 IMPLANT
TRAY LAPAROSCOPIC MC (CUSTOM PROCEDURE TRAY) ×2 IMPLANT
TROCAR 11X100 Z THREAD (TROCAR) ×2 IMPLANT
TROCAR ADV FIXATION 5X100MM (TROCAR) IMPLANT
TROCAR BALLN 12MMX100 BLUNT (TROCAR) IMPLANT
TROCAR Z-THREAD OPTICAL 5X100M (TROCAR) ×2 IMPLANT
TUBING EVAC SMOKE HEATED PNEUM (TUBING) ×2 IMPLANT
WARMER LAPAROSCOPE (MISCELLANEOUS) ×2 IMPLANT
WATER STERILE IRR 1000ML POUR (IV SOLUTION) ×2 IMPLANT

## 2024-08-21 NOTE — Transfer of Care (Signed)
 Immediate Anesthesia Transfer of Care Note  Patient: Felicia Washington  Procedure(s) Performed: REPAIR, HERNIA, HIATAL, LAPAROSCOPIC (Abdomen) CREATION, GASTROSTOMY, LAPAROSCOPIC (Abdomen)  Patient Location: PACU  Anesthesia Type:General  Level of Consciousness: drowsy, patient cooperative, and responds to stimulation  Airway & Oxygen  Therapy: Patient Spontanous Breathing  Post-op Assessment: Report given to RN and Post -op Vital signs reviewed and stable  Post vital signs: Reviewed and stable  Last Vitals:  Vitals Value Taken Time  BP 130/75 08/21/24 11:31  Temp    Pulse 93 08/21/24 11:32  Resp 14 08/21/24 11:32  SpO2 96 % 08/21/24 11:32  Vitals shown include unfiled device data.  Last Pain:  Vitals:   08/21/24 0746  TempSrc:   PainSc: 0-No pain         Complications: No notable events documented.

## 2024-08-21 NOTE — Plan of Care (Signed)
  Problem: Education: Goal: Knowledge of General Education information will improve Description: Including pain rating scale, medication(s)/side effects and non-pharmacologic comfort measures Outcome: Progressing   Problem: Activity: Goal: Risk for activity intolerance will decrease Outcome: Progressing   Problem: Elimination: Goal: Will not experience complications related to bowel motility Outcome: Progressing   Problem: Pain Managment: Goal: General experience of comfort will improve and/or be controlled Outcome: Progressing   Problem: Safety: Goal: Ability to remain free from injury will improve Outcome: Progressing   Problem: Skin Integrity: Goal: Risk for impaired skin integrity will decrease Outcome: Progressing

## 2024-08-21 NOTE — Op Note (Signed)
 Preoperative diagnosis: hiatal hernia with volvulus  Postoperative diagnosis: same   Procedure: laparoscopic paraesophageal hernia repair, laparoscopic Toupet fundoplication, laparoscopic g tube insertion  Surgeon: Herlene Bureau, M.D.  Asst: none  Anesthesia: general  Indications for procedure: Felicia Washington is a 78 y.o. year old female with symptoms of nausea and vomiting. She had a CT scan showing a very large hiatal hernia with GE junction in the abdomen, body of the stomach in the chest in odd rotation. NG tube was placed, she was admitted and after decompression, she was brought to the OR for repair   Description of procedure: The patient was brought into the operative suite. Anesthesia was administered with General endotracheal anesthesia. WHO checklist was applied. The patient was then placed in supine position. The area was prepped and draped in the usual sterile fashion.  Next, a left subcostal incision was made. A 5mm trocar was used to gain access to the peritoneal cavity by optical entry technique. Pneumoperitoneum was applied with a high flow and low pressure. The laparoscope was reinserted to confirm position. A 5 mm trocar was placed in the left periumbilical space. Bilateral TAP blocks were placed with Marcaine . 1 5 mm trocar was placed in the right subcostal area. 1 12 mm trocar was placed in the right mid abdominal space. 1 5 mm trocar was placed in the left lateral space. A Nathanson retractor was placed in the subxiphoid space and used to retract the left lobe of the liver.  The hiatal hernia appeared very large and contained the upper half of the stomach. The pars flaccida was divided with harmonic scalpel The peritoneum of the right crus was divided and the sac separated from the chest contents. This plane was continued anteriorly and to the left crus. Next, the posterior area was dissected free. A portion of the short gastric vessels were divided to enter the lesser sac  and free any last attachments to the left crus. Additional care was used to dissect attachments to the chest to the sac and esophagus to improve mobility. A penrose was placed around the GE junction for visualization and retraction. The esophagus was completely freed from surrounding attachments. Care was taken to avoid injury to the vagus nerves. The sac was divided from the GE junction and removed. The GE junction was seen and 3 cm of esophagus appeared to be in the abdomen.  The crus was repaired with 5 interrupted 2-0 ethibond sutures showing appropriate sizing of the crus around the bougie. A toupet was created by bringing the fundus posterior to the esophagus and 3 2-0 silks were used to suture the esophagus to the fundus on the right side. A portion of anterior fundus was then sutured to the esophagus on the left side with 3 2-0 silks.   Next, the distal stomach body was identified and appeared to reach the left upper quadrant abdominal wall. A pursestring with 2-0 silk was made in the distal body. A gastrotomy was performed in the center A gastrostomy was created with cautery. A 18  french gastrostomy tube was inserted and balloon filled with 10 ml of saline. 2 2-0 silks were passed through the distal body of the stomach and sutured to the abdominal wall using a suture passer. The gastrostomy balloon was pulled up to the abdominal wall and was noted at 4 cm at the skin level.   Hemostasis was inspected and intact. Pneumoperitoneum was removed. All trocars were removed. All incisions were closed with 4-0 monocryl subcuticular  suture. Dermabond was placed for dressing. The patient awoke from anesthesia and was brought to pacu in stable condition.  Findings: large hiatal hernia, 3 cm intraabdominal esophagus after repair, g tube in distal body of stomach  Specimen: none  Implant: 18 Fr g tube   Blood loss: 50 ml  Local anesthesia: 30 ml Marcaine    Complications: none  Herlene Bureau,  M.D. General, Bariatric, & Minimally Invasive Surgery [facility]

## 2024-08-21 NOTE — Anesthesia Procedure Notes (Signed)
 Procedure Name: Intubation Date/Time: 08/21/2024 8:40 AM  Performed by: Virgil Ee, CRNAPre-anesthesia Checklist: Patient identified, Patient being monitored, Timeout performed, Emergency Drugs available and Suction available Patient Re-evaluated:Patient Re-evaluated prior to induction Oxygen  Delivery Method: Circle system utilized Preoxygenation: Pre-oxygenation with 100% oxygen  Induction Type: IV induction Ventilation: Mask ventilation without difficulty Laryngoscope Size: Mac and 3 Grade View: Grade I Tube type: Oral Tube size: 7.0 mm Number of attempts: 1 Airway Equipment and Method: Stylet Placement Confirmation: ETT inserted through vocal cords under direct vision, positive ETCO2 and breath sounds checked- equal and bilateral Secured at: 21 cm Tube secured with: Tape Dental Injury: Teeth and Oropharynx as per pre-operative assessment

## 2024-08-21 NOTE — Anesthesia Postprocedure Evaluation (Signed)
 Anesthesia Post Note  Patient: Felicia Washington  Procedure(s) Performed: REPAIR, HERNIA, HIATAL, LAPAROSCOPIC (Abdomen) CREATION, GASTROSTOMY, LAPAROSCOPIC (Abdomen)     Patient location during evaluation: PACU Anesthesia Type: General Level of consciousness: awake and alert Pain management: pain level controlled Vital Signs Assessment: post-procedure vital signs reviewed and stable Respiratory status: spontaneous breathing, nonlabored ventilation, respiratory function stable and patient connected to nasal cannula oxygen  Cardiovascular status: blood pressure returned to baseline and stable Postop Assessment: no apparent nausea or vomiting Anesthetic complications: no   No notable events documented.  Last Vitals:  Vitals:   08/21/24 1200 08/21/24 1230  BP: 137/78 (!) 162/79  Pulse: 86 87  Resp: 17 17  Temp: 36.9 C 36.4 C  SpO2: 94% 100%    Last Pain:  Vitals:   08/21/24 1230  TempSrc: Oral  PainSc:                  Rome Ade

## 2024-08-21 NOTE — Progress Notes (Signed)
 [facility] Progress Note Patient: Felicia Washington FMW:[id number] DOB: [birth year]  DOA: 08/18/2024 DOS: the patient was seen and examined on 08/21/2024  Brief Hospital Course: Felicia Washington is a 78 y.o. female with a history of GERD, HTN, HLD, hypothyroidism, Graves' disease, stroke who presented to the ED with abrupt onset of pain in the upper abdomen and lower chest that has worsened, constant, associated with frothy oral output, vomiting of stomach contents from breakfast many hours prior. She's had no dyspnea. Symptoms came on while sitting, not eating or drinking. Can't drink anything as it now comes right back up. No palpitations. No changes in bowel habits.  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 Currently scheduled for surgery on 8/25. Assessment and Plan: Paraesophageal gastric hernia with gastric obstruction fluid-filled esophagus/stomach.  No evidence of ischemia or perforation.  General surgery consulted. Currently NG tube for decompression. Maintain K more than 4, mag more than 2. Underwent : laparoscopic paraesophageal hernia repair, laparoscopic Toupet fundoplication, laparoscopic g tube insertion surgery on 8/25.   Surgery allowed clear liquid diet after surgery. Continue PPI twice daily. Continue IV fluid.   HTN:  Hold norvasc , olmesartan , metoprolol , will order prn metoprolol    History of cryptogenic stroke, HLD Bilateral cortical infarcts concerning for embolic phenomenon, loop record still in situ.  Holding ASA, statin Has intermittent confusion.   Hypercalcemia:  Remains elevated but now improving with aggressive IV hydration. Vitamin D  low.  PTH pending. Monitor.   Hypothyroidism:  If unable to restart po, would substitute with IV synthroid  at half dose.    Asthma:  Quiescent - Hold singulair , continue prn albuterol    Depression/anxiety:  - Hold bupropion , clonazepam, will substitute low dose diazepam  IV  prn  Elevated lipase. Lipase level was elevated more than 200. Currently improving. CT scan negative for any pancreatic involvement. Suspect this is in setting of bowel obstruction.   Subjective: Seen after the surgery.  Tolerating oral medications.  No nausea no vomiting.  Pain well-controlled.  Physical Exam: Clear to auscultation. S1-S2 present.  Bowel sound absent.  Data Reviewed: I have Reviewed nursing notes, Vitals, and Lab results. Since last encounter, pertinent lab results CBC and BMP   . I have ordered test including CBC and BMP  .   Disposition: Status is: Inpatient Awaiting recovery after surgery. enoxaparin  (LOVENOX ) injection 40 mg Start: 08/19/24 0200   Family Communication: Family at bedside Level of care: Med-Surg   Vitals:   08/21/24 1145 08/21/24 1200 08/21/24 1230 08/21/24 1728  BP: 133/79 137/78 (!) 162/79 (!) 143/79  Pulse: 95 86 87 76  Resp: 17 17 17    Temp:  98.4 F (36.9 C) 97.6 F (36.4 C) (!) 97.5 F (36.4 C)  TempSrc:   Oral Oral  SpO2: 96% 94% 100% 95%  Weight:      Height:         Author: Yetta Blanch, MD 08/21/2024 7:10 PM  Please look on [email or web address] to find out who is on call.

## 2024-08-21 NOTE — Progress Notes (Signed)
 Pt arrived from PACU, Bernardino gave report. Pt is in bed and family at bedside. Pt transfer to [facility], did well. Pt back in bed, no pain at this time.

## 2024-08-21 NOTE — Plan of Care (Signed)
  Problem: Clinical Measurements: Goal: Respiratory complications will improve Outcome: Progressing Goal: Cardiovascular complication will be avoided Outcome: Progressing   Problem: Activity: Goal: Risk for activity intolerance will decrease Outcome: Progressing   Problem: Nutrition: Goal: Adequate nutrition will be maintained Outcome: Progressing   Problem: Elimination: Goal: Will not experience complications related to urinary retention Outcome: Progressing   Problem: Pain Managment: Goal: General experience of comfort will improve and/or be controlled Outcome: Progressing

## 2024-08-21 NOTE — Progress Notes (Signed)
 Pre Procedure note for inpatients:   Felicia Washington has been scheduled for Procedure(s): REPAIR, HERNIA, HIATAL, LAPAROSCOPIC (N/A) CREATION, GASTROSTOMY, LAPAROSCOPIC (N/A) today. The various methods of treatment have been discussed with the patient. After consideration of the risks, benefits and treatment options the patient has consented to the planned procedure.   The patient has been seen and labs reviewed. There are no changes in the patient's condition to prevent proceeding with the planned procedure today.  Recent labs:  Lab Results  Component Value Date   WBC 11.5 (H) 08/20/2024   HGB 10.0 (L) 08/20/2024   HCT 29.4 (L) 08/20/2024   PLT 204 08/20/2024   GLUCOSE 111 (H) 08/20/2024   CHOL 165 01/02/2022   TRIG 90 01/02/2022   HDL 76 01/02/2022   LDLCALC 71 01/02/2022   ALT 16 08/20/2024   AST 27 08/20/2024   NA 134 (L) 08/20/2024   K 3.8 08/20/2024   CL 104 08/20/2024   CREATININE 1.06 (H) 08/20/2024   BUN 14 08/20/2024   CO2 25 08/20/2024   INR 0.9 01/01/2022   HGBA1C 4.9 01/02/2022    Herlene Beverley Bureau, MD 08/21/2024 8:01 AM

## 2024-08-22 ENCOUNTER — Encounter (HOSPITAL_COMMUNITY): Payer: Self-pay | Admitting: General Surgery

## 2024-08-22 DIAGNOSIS — K440 Diaphragmatic hernia with obstruction, without gangrene: Secondary | ICD-10-CM | POA: Diagnosis not present

## 2024-08-22 LAB — COMPREHENSIVE METABOLIC PANEL WITH GFR
ALT: 35 U/L (ref 0–44)
AST: 43 U/L — ABNORMAL HIGH (ref 15–41)
Albumin: 3.1 g/dL — ABNORMAL LOW (ref 3.5–5.0)
Alkaline Phosphatase: 38 U/L (ref 38–126)
Anion gap: 8 (ref 5–15)
BUN: 8 mg/dL (ref 8–23)
CO2: 26 mmol/L (ref 22–32)
Calcium: 9.5 mg/dL (ref 8.9–10.3)
Chloride: 100 mmol/L (ref 98–111)
Creatinine, Ser: 0.94 mg/dL (ref 0.44–1.00)
GFR, Estimated: >60 mL/min (ref >60)
Glucose, Bld: 126 mg/dL — ABNORMAL HIGH (ref 70–99)
Potassium: 4.6 mmol/L (ref 3.5–5.1)
Sodium: 134 mmol/L — ABNORMAL LOW (ref 135–145)
Total Bilirubin: 0.7 mg/dL (ref 0.0–1.2)
Total Protein: 5.7 g/dL — ABNORMAL LOW (ref 6.5–8.1)

## 2024-08-22 LAB — CBC WITH DIFFERENTIAL/PLATELET
Abs Immature Granulocytes: 0.05 K/uL (ref 0.00–0.07)
Basophils Absolute: 0 K/uL (ref 0.0–0.1)
Basophils Relative: 0 %
Eosinophils Absolute: 0 K/uL (ref 0.0–0.5)
Eosinophils Relative: 0 %
HCT: 29.6 % — ABNORMAL LOW (ref 36.0–46.0)
Hemoglobin: 10 g/dL — ABNORMAL LOW (ref 12.0–15.0)
Immature Granulocytes: 1 %
Lymphocytes Relative: 10 %
Lymphs Abs: 1 K/uL (ref 0.7–4.0)
MCH: 30.3 pg (ref 26.0–34.0)
MCHC: 33.8 g/dL (ref 30.0–36.0)
MCV: 89.7 fL (ref 80.0–100.0)
Monocytes Absolute: 0.9 K/uL (ref 0.1–1.0)
Monocytes Relative: 9 %
Neutro Abs: 8.1 K/uL — ABNORMAL HIGH (ref 1.7–7.7)
Neutrophils Relative %: 80 %
Platelets: 202 K/uL (ref 150–400)
RBC: 3.3 MIL/uL — ABNORMAL LOW (ref 3.87–5.11)
RDW: 12.2 % (ref 11.5–15.5)
WBC: 10.1 K/uL (ref 4.0–10.5)
nRBC: 0 % (ref 0.0–0.2)

## 2024-08-22 LAB — MAGNESIUM: Magnesium: 2.1 mg/dL (ref 1.7–2.4)

## 2024-08-22 MED ORDER — BOOST / RESOURCE BREEZE PO LIQD CUSTOM
1.0000 | Freq: Three times a day (TID) | ORAL | Status: DC
  Administered 2024-08-22: 237 mL via ORAL
  Administered 2024-08-23: 1 via ORAL

## 2024-08-22 MED ORDER — ENOXAPARIN SODIUM 40 MG/0.4ML IJ SOSY
40.0000 mg | PREFILLED_SYRINGE | INTRAMUSCULAR | Status: DC
  Administered 2024-08-23: 40 mg via SUBCUTANEOUS
  Filled 2024-08-22: qty 0.4

## 2024-08-22 NOTE — Plan of Care (Signed)
  Problem: Clinical Measurements: Goal: Will remain free from infection Outcome: Progressing   Problem: Activity: Goal: Risk for activity intolerance will decrease Outcome: Progressing   Problem: Nutrition: Goal: Adequate nutrition will be maintained Outcome: Progressing   Problem: Coping: Goal: Level of anxiety will decrease Outcome: Progressing   

## 2024-08-22 NOTE — Care Management Important Message (Signed)
 Important Message  Patient Details  Name: ANMARIE FUKUSHIMA MRN: [id number] Date of Birth: 1946-07-15   Important Message Given:  Yes - Medicare IM     Jon Cruel 08/22/2024, 4:08 PM

## 2024-08-22 NOTE — Progress Notes (Signed)
 [facility] Progress Note Patient: Felicia Washington FMW:[id number] DOB: 26-Jan-1946  DOA: 08/18/2024 DOS: the patient was seen and examined on 08/22/2024  Brief Hospital Course: SHEILLA MARIS is a 78 y.o. female with a history of GERD, HTN, HLD, hypothyroidism, Graves' disease, stroke who presented to the ED with abrupt onset of pain in the upper abdomen and lower chest that has worsened, constant, associated with frothy oral output, vomiting of stomach contents from breakfast many hours prior. She's had no dyspnea. Symptoms came on while sitting, not eating or drinking. Can't drink anything as it now comes right back up. No palpitations. No changes in bowel habits.  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 Underwent fundoplication, hernia repair and G-tube insertion on 8/25.  Assessment and Plan: Paraesophageal gastric hernia with gastric obstruction fluid-filled esophagus/stomach.  No evidence of ischemia or perforation.  General surgery consulted. Currently NG tube for decompression. Maintain K more than 4, mag more than 2. Underwent : laparoscopic paraesophageal hernia repair, laparoscopic Toupet fundoplication, laparoscopic g tube insertion surgery on 8/25.   Continuing clear liquid diet. Continue PPI twice daily. Continue IV fluid.   HTN:  Hold norvasc , olmesartan , metoprolol , will order prn metoprolol    History of cryptogenic stroke, HLD Bilateral cortical infarcts concerning for embolic phenomenon, loop record still in situ.  Holding ASA, statin Has intermittent confusion.   Hypercalcemia:  Remains elevated but now improving with aggressive IV hydration. Vitamin D  low.  PTH pending. Monitor.   Hypothyroidism:  If unable to restart po, would substitute with IV synthroid  at half dose.  Currently scheduled initiated 8/28.   Asthma:  No wheezing so far. Hold singulair , continue prn albuterol    Depression/anxiety:  Hold  bupropion , clonazepam, will substitute low dose diazepam  IV prn  Elevated lipase. Lipase level was elevated more than 200. Currently improving. CT scan negative for any pancreatic involvement. Suspect this is in setting of bowel obstruction.   Subjective: No nausea no vomiting.  Abdominal pain resolved.  Passing gas.  No BM.  Physical Exam: Clear to auscultation. S1-S2 present Bowel sound present. Diffuse tenderness. No edema.   Data Reviewed: I have Reviewed nursing notes, Vitals, and Lab results. Since last encounter, pertinent lab results CBC and BMP   . I have ordered test including CBC and BMP  .   Disposition: Status is: Inpatient Remains inpatient appropriate because: Monitor for improvement bowel function  enoxaparin  (LOVENOX ) injection 40 mg Start: 08/23/24 1000  Family Communication: Family at bedside Level of care: Med-Surg   Vitals:   08/22/24 0010 08/22/24 0630 08/22/24 0953 08/22/24 1532  BP: 136/77 (!) 148/90 (!) 147/82 138/81  Pulse: 85 96 82 92  Resp: 17 18 17 17   Temp: 97.9 F (36.6 C) (!) 97.5 F (36.4 C) 98.3 F (36.8 C) 98.2 F (36.8 C)  TempSrc: Oral Oral Oral Oral  SpO2: 98% 98% 99% 100%  Weight:      Height:         Author: Yetta Blanch, MD 08/22/2024 7:03 PM  Please look on [email or web address] to find out who is on call.

## 2024-08-22 NOTE — Progress Notes (Signed)
 1 Day Post-Op  Subjective: CC: G-tube clamped and on CLD post op.  Felt like grape juice was stuck in her chest yesterday when she was reclined while drinking. Otherwise no issues with cld when sitting upright. No n/v. Sore along the sides of her abdomen near her incisions. No flatus or BM. Voiding. Mobilizing. At baseline lives at home alone and ambulates independently.   Afebrile. No tachycardia or hypotension. WBC wnl. Hgb stable. Cr wnl. K and Mg wnl.   Objective: Vital signs in last 24 hours: Temp:  [97.5 F (36.4 C)-98.4 F (36.9 C)] 97.5 F (36.4 C) (08/26 0630) Pulse Rate:  [76-96] 96 (08/26 0630) Resp:  [17-18] 18 (08/26 0630) BP: (130-162)/(75-90) 148/90 (08/26 0630) SpO2:  [94 %-100 %] 98 % (08/26 0630) Last BM Date : 08/18/24  Intake/Output from previous day: 08/25 0701 - 08/26 0700 In: 1320 [P.O.:120; I.V.:600; IV Piggyback:600] Out: 100 [Urine:50; Blood:50] Intake/Output this shift: No intake/output data recorded.  PE: Gen:  Alert, NAD, pleasant Abd: Soft, mild distension, appropriately tender around laparoscopic incisions, no rigidity or guarding and otherwise NT, +BS. Incisions with glue intact appears well and are without drainage, bleeding, or signs of infection. G-tube clamped  Lab Results:  Recent Labs    08/21/24 1443 08/22/24 0244  WBC 8.5 10.1  HGB 10.2* 10.0*  HCT 31.2* 29.6*  PLT 180 202   BMET Recent Labs    08/21/24 1443 08/22/24 0244  NA 133* 134*  K 4.0 4.6  CL 99 100  CO2 23 26  GLUCOSE 255* 126*  BUN 11 8  CREATININE 1.11* 0.94  CALCIUM  9.5 9.5   PT/INR No results for input(s): LABPROT, INR in the last 72 hours. CMP     Component Value Date/Time   NA 134 (L) 08/22/2024 0244   NA 133 (L) 11/09/2022 1006   K 4.6 08/22/2024 0244   CL 100 08/22/2024 0244   CO2 26 08/22/2024 0244   GLUCOSE 126 (H) 08/22/2024 0244   BUN 8 08/22/2024 0244   BUN 40 (H) 11/09/2022 1006   CREATININE 0.94 08/22/2024 0244   CALCIUM  9.5  08/22/2024 0244   PROT 5.7 (L) 08/22/2024 0244   ALBUMIN  3.1 (L) 08/22/2024 0244   AST 43 (H) 08/22/2024 0244   ALT 35 08/22/2024 0244   ALKPHOS 38 08/22/2024 0244   BILITOT 0.7 08/22/2024 0244   GFRNONAA >60 08/22/2024 0244   GFRAA 53 (L) 08/23/2018 1147   Lipase     Component Value Date/Time   LIPASE 69 (H) 08/19/2024 1601    Studies/Results: No results found.  Anti-infectives: Anti-infectives (From admission, onward)    Start     Dose/Rate Route Frequency Ordered Stop   08/21/24 0600  ceFAZolin  (ANCEF ) IVPB 2g/100 mL premix        2 g 200 mL/hr over 30 Minutes Intravenous On call to O.R. 08/20/24 0744 08/21/24 0911        Assessment/Plan POD 1 s/p laparoscopic paraesophageal hernia repair, laparoscopic Toupet fundoplication, laparoscopic g tube insertion by Dr. Stevie on 8/25 for hiatal hernia with volvulus - Cont CLD with G-tube clamped. AROBF. Plan to advance and discharge on FLD + shakes when tolerating CLD and having return of bowel function. If develops any worsening abdominal pain, distension, n/v can make NPO and place G-tube to gravity.  - Mobilize, pulm toilet  FEN - CLD, G-tube clamped, PPI, IVF per primary  VTE - SCDs, Lovenox  ID - Ancef  peri-op. None currently.  Foley - None,  spont void Plan - As above.    LOS: 3 days    Felicia Washington, [facility] 08/22/2024, 8:49 AM Please see Amion for pager number during day hours 7:00am-4:30pm

## 2024-08-23 ENCOUNTER — Other Ambulatory Visit (HOSPITAL_COMMUNITY): Payer: Self-pay

## 2024-08-23 DIAGNOSIS — K440 Diaphragmatic hernia with obstruction, without gangrene: Secondary | ICD-10-CM | POA: Diagnosis not present

## 2024-08-23 LAB — CBC WITH DIFFERENTIAL/PLATELET
Abs Immature Granulocytes: 0.04 K/uL (ref 0.00–0.07)
Basophils Absolute: 0 K/uL (ref 0.0–0.1)
Basophils Relative: 0 %
Eosinophils Absolute: 0.3 K/uL (ref 0.0–0.5)
Eosinophils Relative: 5 %
HCT: 26.8 % — ABNORMAL LOW (ref 36.0–46.0)
Hemoglobin: 9.1 g/dL — ABNORMAL LOW (ref 12.0–15.0)
Immature Granulocytes: 1 %
Lymphocytes Relative: 20 %
Lymphs Abs: 1.4 K/uL (ref 0.7–4.0)
MCH: 30.8 pg (ref 26.0–34.0)
MCHC: 34 g/dL (ref 30.0–36.0)
MCV: 90.8 fL (ref 80.0–100.0)
Monocytes Absolute: 0.9 K/uL (ref 0.1–1.0)
Monocytes Relative: 12 %
Neutro Abs: 4.5 K/uL (ref 1.7–7.7)
Neutrophils Relative %: 62 %
Platelets: 200 K/uL (ref 150–400)
RBC: 2.95 MIL/uL — ABNORMAL LOW (ref 3.87–5.11)
RDW: 12.5 % (ref 11.5–15.5)
WBC: 7.2 K/uL (ref 4.0–10.5)
nRBC: 0 % (ref 0.0–0.2)

## 2024-08-23 LAB — COMPREHENSIVE METABOLIC PANEL WITH GFR
ALT: 27 U/L (ref 0–44)
AST: 28 U/L (ref 15–41)
Albumin: 2.7 g/dL — ABNORMAL LOW (ref 3.5–5.0)
Alkaline Phosphatase: 42 U/L (ref 38–126)
Anion gap: 8 (ref 5–15)
BUN: 8 mg/dL (ref 8–23)
CO2: 24 mmol/L (ref 22–32)
Calcium: 8.7 mg/dL — ABNORMAL LOW (ref 8.9–10.3)
Chloride: 101 mmol/L (ref 98–111)
Creatinine, Ser: 0.95 mg/dL (ref 0.44–1.00)
GFR, Estimated: >60 mL/min (ref >60)
Glucose, Bld: 95 mg/dL (ref 70–99)
Potassium: 4 mmol/L (ref 3.5–5.1)
Sodium: 133 mmol/L — ABNORMAL LOW (ref 135–145)
Total Bilirubin: 0.9 mg/dL (ref 0.0–1.2)
Total Protein: 4.9 g/dL — ABNORMAL LOW (ref 6.5–8.1)

## 2024-08-23 LAB — MAGNESIUM: Magnesium: 1.6 mg/dL — ABNORMAL LOW (ref 1.7–2.4)

## 2024-08-23 MED ORDER — ENOXAPARIN SODIUM 40 MG/0.4ML IJ SOSY: 40.0000 mg | PREFILLED_SYRINGE | INTRAMUSCULAR | Status: DC

## 2024-08-23 MED ORDER — MAGNESIUM SULFATE 2 GM/50ML IV SOLN
2.0000 g | Freq: Once | INTRAVENOUS | Status: DC
  Filled 2024-08-23: qty 50

## 2024-08-23 MED ORDER — PANTOPRAZOLE SODIUM 40 MG PO TBEC
40.0000 mg | DELAYED_RELEASE_TABLET | Freq: Two times a day (BID) | ORAL | Status: DC
  Administered 2024-08-23: 40 mg via ORAL
  Filled 2024-08-23: qty 1

## 2024-08-23 MED ORDER — LEVOTHYROXINE SODIUM 112 MCG PO TABS: 56.0000 ug | ORAL_TABLET | ORAL | Status: DC

## 2024-08-23 MED ORDER — METOPROLOL SUCCINATE ER 25 MG PO TB24
50.0000 mg | ORAL_TABLET | Freq: Every day | ORAL | Status: DC
Start: 2024-08-23 — End: 2024-08-23

## 2024-08-23 MED ORDER — AMLODIPINE BESYLATE 10 MG PO TABS: 10.0000 mg | ORAL_TABLET | Freq: Every day | ORAL | Status: DC

## 2024-08-23 MED ORDER — BUPROPION HCL ER (XL) 150 MG PO TB24
150.0000 mg | ORAL_TABLET | Freq: Every morning | ORAL | Status: DC
  Administered 2024-08-23: 150 mg via ORAL
  Filled 2024-08-23: qty 1

## 2024-08-23 MED ORDER — ONDANSETRON HCL 4 MG PO TABS: 4.0000 mg | ORAL_TABLET | Freq: Four times a day (QID) | ORAL | Status: DC | PRN

## 2024-08-23 MED ORDER — LEVOTHYROXINE SODIUM 112 MCG PO TABS: 224.0000 ug | ORAL_TABLET | ORAL | Status: DC

## 2024-08-23 MED ORDER — DIAZEPAM 2 MG PO TABS: 2.0000 mg | ORAL_TABLET | Freq: Three times a day (TID) | ORAL | Status: DC | PRN

## 2024-08-23 MED ORDER — OXYCODONE HCL 5 MG PO TABS
5.0000 mg | ORAL_TABLET | ORAL | 0 refills | Status: DC | PRN
  Filled 2024-08-23: qty 20, 4d supply, fill #0

## 2024-08-23 MED ORDER — ASPIRIN EC 81 MG PO TBEC: 81.0000 mg | DELAYED_RELEASE_TABLET | Freq: Every morning | ORAL | Status: DC

## 2024-08-23 MED ORDER — ENSURE SURGERY PO LIQD
237.0000 mL | Freq: Two times a day (BID) | ORAL | Status: DC
  Administered 2024-08-23: 237 mL via ORAL
  Filled 2024-08-23 (×2): qty 237

## 2024-08-23 MED ORDER — FEBUXOSTAT 40 MG PO TABS
80.0000 mg | ORAL_TABLET | Freq: Every day | ORAL | Status: DC
  Filled 2024-08-23: qty 2

## 2024-08-23 MED ORDER — MAGNESIUM GLUCONATE 500 (27 MG) MG PO TABS
500.0000 mg | ORAL_TABLET | Freq: Two times a day (BID) | ORAL | Status: DC
  Administered 2024-08-23: 500 mg via ORAL
  Filled 2024-08-23 (×2): qty 1

## 2024-08-23 MED ORDER — ACETAMINOPHEN 325 MG PO TABS: 650.0000 mg | ORAL_TABLET | Freq: Four times a day (QID) | ORAL | Status: DC | PRN

## 2024-08-23 MED ORDER — LEVOTHYROXINE SODIUM 112 MCG PO TABS
112.0000 ug | ORAL_TABLET | ORAL | Status: DC
  Administered 2024-08-23: 112 ug via ORAL
  Filled 2024-08-23: qty 1

## 2024-08-23 NOTE — Plan of Care (Signed)

## 2024-08-23 NOTE — Evaluation (Signed)
 Physical Therapy Brief Evaluation and Discharge Note Patient Details Name: Felicia Washington MRN: [id number] DOB: 1946-09-23 Today's Date: 08/23/2024   History of Present Illness  Felicia Washington is a 78 y.o. 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 Pt underwent undoplication, hernia repair, and G-tube insertion on 8/25. PMHx: GERD, HTN, HLD, hypothyroidism, Graves' disease, and CVA.   Clinical Impression  Pt greeted supine in bed, pleasant and agreeable to PT evaluation. PTA, pt was independent with functional mobility, ADLs/IADLs, and driving. She lives alone in a one story house with 2 STE. Pt plans to discharge to her sister's who can provide 24/7 supervision and assist. Pt performed bed mobility and transfers with modI and gait and stairs with supervision. She ambulated ~268ft with a reciprocal gait pattern and ascended/descended 2 steps with unilateral handrail. Pt appears to be close to her baseline function. No follow-up PT services needed. Patient feels ready and safe for discharge. I have answered all her questions related to mobility.     PT Assessment Patient does not need any further PT services  Assistance Needed at Discharge  PRN    Equipment Recommendations None recommended by PT  Recommendations for Other Services       Precautions/Restrictions Precautions Precautions: Other (comment) (Abdominal) Recall of Precautions/Restrictions: Intact Restrictions Weight Bearing Restrictions Per Provider Order: No        Mobility  Bed Mobility Rolling: Modified independent (Device/Increase time) Supine/Sidelying to sit: Modified independent (Device/Increased time) Sit to supine/sidelying: Modified independent (Device/Increased time) General bed mobility comments: Pt demonstrated log roll technique. No cues for sequencing. Slightly increased time to complete. HOB elevated.  Transfers Overall transfer level: Modified  independent Equipment used: None Transfers: Sit to/from Stand, Bed to chair/wheelchair/BSC             General transfer comment: Pt performed sit<>stand and bed<>chair without AD. She pushed up with BUE support. Good eccentric control with sitting.    Ambulation/Gait Ambulation/Gait assistance: Supervision Gait Distance (Feet): 200 Feet Assistive device: None Gait Pattern/deviations: Step-through pattern, Decreased stride length, Drifts right/left Gait Speed: Pace WFL General Gait Details: Pt ambulated with a reciprocal gait pattern, even weight shift, and good foot clearence. She drifted slightly within the hallway without LOB.  Home Activity Instructions    Stairs Stairs: Yes Stairs assistance: Supervision Stair Management: One rail Right, Forwards, Step to pattern Number of Stairs: 2 General stair comments: Pt ascended/descended leading with RLE. She utilized unilat UE support.  Modified Rankin (Stroke Patients Only)        Balance Overall balance assessment: Mild deficits observed, not formally tested                        Pertinent Vitals/Pain   Pain Assessment Pain Assessment: No/denies pain     Home Living Family/patient expects to be discharged to:: Private residence Living Arrangements: Alone Available Help at Discharge: Family;Available 24 hours/day;Available PRN/intermittently (Sister 24/7; Daughter(s) and Son prn) Home Environment: Stairs to enter;Stairs in home;Rail - right  Stairs-Number of Steps: 2 on front of the house; stair lift inside her sister's house Home Equipment: Shower seat   Additional Comments:  (patient reports that she may go stay with her sister for a few days upon DC.  Sisters house is 1 level and has a stairlift)    Prior Function Level of Independence: Independent      UE/LE Assessment   UE ROM/Strength/Tone/Coordination: Generalized weakness    LE ROM/Strength/Tone/Coordination:  WFL      Communication    Communication Communication: No apparent difficulties     Cognition Overall Cognitive Status: Appears within functional limits for tasks assessed/performed       General Comments General comments (skin integrity, edema, etc.): VSS on RA    Exercises     Assessment/Plan    PT Problem List         PT Visit Diagnosis Other abnormalities of gait and mobility (R26.89)    No Skilled PT Patient is supervision for all activity/mobility;Patient will have necessary level of assist by caregiver at discharge;All education completed   Co-evaluation                AMPAC 6 Clicks Help needed turning from your back to your side while in a flat bed without using bedrails?: None Help needed moving from lying on your back to sitting on the side of a flat bed without using bedrails?: None Help needed moving to and from a bed to a chair (including a wheelchair)?: None Help needed standing up from a chair using your arms (e.g., wheelchair or bedside chair)?: None Help needed to walk in hospital room?: A Little Help needed climbing 3-5 steps with a railing? : A Little 6 Click Score: 22      End of Session   Activity Tolerance: Patient tolerated treatment well Patient left: in chair;with call bell/phone within reach;with family/visitor present Nurse Communication: Mobility status PT Visit Diagnosis: Other abnormalities of gait and mobility (R26.89)     Time: 8661-8647 PT Time Calculation (min) (ACUTE ONLY): 14 min  Charges:   PT Evaluation $PT Eval Low Complexity: 1 Low      Randall SAUNDERS, PT, DPT Acute Rehabilitation Services Office: [phone] Secure Chat Preferred  Delon CHRISTELLA Callander  08/23/2024, 2:32 PM

## 2024-08-23 NOTE — Plan of Care (Signed)

## 2024-08-23 NOTE — Progress Notes (Addendum)
   08/23/24 1609  TOC Brief Assessment  Insurance and Status Reviewed  Patient has primary care physician Yes  Home environment has been reviewed self , family close by  Prior level of function: independent  Prior/Current Home Services No current home services  Social Drivers of Health Review SDOH reviewed no interventions necessary  Readmission risk has been reviewed Yes  Transition of care needs no transition of care needs at this time    Bedside nurse to provide G tube care education to patient and family   Transition of Care Department (TOC) has reviewed patient and no TOC needs have been identified at this time. We will continue to monitor patient advancement through interdisciplinary progression rounds. If new patient transition needs arise, please place a TOC consult.

## 2024-08-23 NOTE — Discharge Summary (Addendum)
 DISCHARGE SUMMARY  [name]  MR#: [id number]  DOB:[birth year]  Date of Admission: 08/18/2024 Date of Discharge: 08/23/2024  Attending Physician:Sherrin Stahle ONEIDA Moores, MD  Patient's ERE:Dnluy, Garnette, MD  Disposition: D/C home   Follow-up Appts:  Follow-up Information     Kinsinger, Herlene Righter, MD. Go on 09/21/2024.   Specialty: General Surgery Why: Appointment at 4:10PM; Please arrive 30 minutes prior to appointment time and bring your insurance cards/ID. Contact information: [address] [place] [address] [phone]         Nichole Garnette, MD Follow up in 10 day(s).   Specialty: Endocrinology Contact information: [address] 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  Following admission she underwent fundoplication with hernia repair and G-tube insertion 8/25.   Hospital Course:  Paraesophageal gastric hiatal hernia with gastric obstruction/volvulus No evidence of ischemia or perforation was found -underwent laparoscopic paraesophageal hernia repair with Toupet fundoplication and G-tube insertion 08/21/2024 - diet and postop care provided by per general surgery -tolerating full liquid diet at time of discharge without any difficulty -is to remain on full liquid diet until follow-up in general surgery office   HTN Blood pressure controlled  -no adjustments made in her usual blood pressure regimen   History of cryptogenic CVA Bilateral cortical infarcts concerning for embolic phenomenon -Loop recorder in situ -resume ASA in 1 week   HLD Continue statin   Hypercalcemia Resolved with volume resuscitation   Hypomagnesemia Supplemented -likely simply due to poor intake   Hypothyroidism Continue usual Synthroid  dose   Asthma Well compensated   Depression/anxiety Continue usual home medications   Elevated lipase Lipase greater than 200 at presentation -CT without evidence of pancreatic inflammation -asymptomatic at time of discharge  Allergies as of 08/23/2024       Reactions   Adhesive [tape] Other (See Comments)   Paper tape only   Allopurinol  Swelling, Hives, Itching   Codeine Nausea Only   Latex Other (See Comments)   Removes skin        Medication List     TAKE these medications    acetaminophen  500 MG tablet Commonly known as: TYLENOL  Take 1,000 mg by mouth every 6 (six) hours as needed for mild pain (pain score 1-3).   albuterol  108 (90 Base) MCG/ACT inhaler Commonly known as: VENTOLIN  HFA Inhale 2 puffs into the lungs every 8 (eight) hours as needed for shortness of breath or wheezing.   amLODipine  10 MG tablet Commonly known as: NORVASC  Take 1 tablet (10 mg total) by mouth every morning. What changed: when to take this   aspirin  EC 81 MG tablet Take 1 tablet (81 mg total) by mouth in the morning. Swallow whole. Start taking on: August 28, 2024 What changed: These instructions start on August 28, 2024. If you are unsure what to do until then, ask your doctor or other care provider.   atorvastatin  40 MG tablet Commonly known as: LIPITOR Take 40 mg  by mouth in the morning.   chlorthalidone  25 MG tablet Commonly known as: HYGROTON  Take 25 mg by mouth every morning.   clonazePAM 0.5 MG tablet Commonly known as: KLONOPIN Take 0.5 mg by mouth at bedtime.   famotidine  20 MG  tablet Commonly known as: PEPCID  Take 20 mg by mouth at bedtime.   Febuxostat  80 MG Tabs Take 80 mg by mouth at bedtime.   fenofibrate  micronized 67 MG capsule Commonly known as: LOFIBRA Take 67 mg by mouth in the morning.   levothyroxine  112 MCG tablet Commonly known as: SYNTHROID  Take 56-224 mcg by mouth See admin instructions. Take 1 tablets (112 mcg) by mouth on Mondays through Saturdays before breakfast. Take 2tablest (224 mcg) by mouth on Sundays before breakfast.   metoprolol  succinate 50 MG 24 hr tablet Commonly known as: Toprol  XL Take 1 tablet (50 mg total) by mouth at bedtime.   montelukast  10 MG tablet Commonly known as: SINGULAIR  Take 10 mg by mouth at bedtime.   olmesartan  40 MG tablet Commonly known as: BENICAR  Take 1 tablet (40 mg total) by mouth every evening.   oxyCODONE  5 MG immediate release tablet Commonly known as: Oxy IR/ROXICODONE  Take 1 tablet (5 mg total) by mouth every 4 (four) hours as needed for moderate pain (pain score 4-6).   potassium chloride  10 MEQ tablet Commonly known as: KLOR-CON  M Take 10 mEq by mouth daily with breakfast.   Wellbutrin  XL 150 MG 24 hr tablet Generic drug: buPROPion  Take 150 mg by mouth in the morning.        Day of Discharge BP 128/76 (BP Location: Left Arm)   Pulse 90   Temp 98.2 F (36.8 C) (Oral)   Resp 17   Ht 5' 5 (1.651 m)   Wt 59.4 kg   SpO2 100%   BMI 21.80 kg/m   Physical Exam: General: No acute respiratory distress Lungs: Clear to auscultation bilaterally without wheezes or crackles Cardiovascular: Regular rate and rhythm without murmur gallop or rub normal S1 and S2 Abdomen: Nontender, nondistended, soft, bowel sounds positive, no rebound, no ascites, no appreciable mass Extremities: No significant cyanosis, clubbing, or edema bilateral lower extremities  Basic Metabolic Panel: Recent Labs  Lab 08/19/24 1601 08/20/24 0548 08/21/24 1443 08/22/24 0244 08/23/24 0219  NA 135 134*  133* 134* 133*  K 3.7 3.8 4.0 4.6 4.0  CL 102 104 99 100 101  CO2 25 25 23 26 24   GLUCOSE 110* 111* 255* 126* 95  BUN 18 14 11 8 8   CREATININE 1.04* 1.06* 1.11* 0.94 0.95  CALCIUM  10.2 9.5 9.5 9.5 8.7*  MG 1.5* 2.0 1.5* 2.1 1.6*    CBC: Recent Labs  Lab 08/19/24 0712 08/20/24 0548 08/21/24 1443 08/22/24 0244 08/23/24 0219  WBC 12.6* 11.5* 8.5 10.1 7.2  NEUTROABS  --  8.7* 7.8* 8.1* 4.5  HGB 11.6* 10.0* 10.2* 10.0* 9.1*  HCT 33.5* 29.4* 31.2* 29.6* 26.8*  MCV 87.7 89.4 91.2 89.7 90.8  PLT 268 204 180 202 200    Time spent in discharge (includes decision making & examination of pt): 35 minutes  08/23/2024, 5:17 PM   Reyes IVAR Moores, MD [facility] Office  [phone]

## 2024-08-23 NOTE — Evaluation (Signed)
 Occupational Therapy Evaluation Patient Details Name: Felicia Washington MRN: [id number] DOB: 1946/05/02 Today's Date: 08/23/2024   History of Present Illness   Patient is a 78 yo female that presented with abdominal pain and admitted for paraesophageal gastric hernia and gastric obstruction s/p repair abd G tube placement. PMHx: GERD, HTN, HLD, hypothyroidism, CVA, depression, anxiety     Clinical Impressions Patient reports that she lives alone in 1 level home with 2 STE and is independent for ADLs and IADLs at baseline.  She reports that she dopes have family that lives next door that can help if needed.  Patient currently requires CGA/Supervision for functional mobility and LB ADLs at this time. Patient educated on abdominal precautions 2/2 G tube placement and hernia repair with fair understanding.  Patient would benefit from additional OT services while admitted  to address overall activity tolerance, UE strength and safety during ADLs but no OT services needed once patient returns home.     If plan is discharge home, recommend the following:   A little help with walking and/or transfers;Assistance with cooking/housework;Help with stairs or ramp for entrance;Assist for transportation;A little help with bathing/dressing/bathroom     Functional Status Assessment   Patient has had a recent decline in their functional status and demonstrates the ability to make significant improvements in function in a reasonable and predictable amount of time.     Equipment Recommendations   None recommended by OT     Recommendations for Other Services         Precautions/Restrictions   Precautions Precautions: Other (comment) (abdominal) Recall of Precautions/Restrictions: Intact Restrictions Weight Bearing Restrictions Per Provider Order: No     Mobility Bed Mobility Overal bed mobility: Independent                  Transfers Overall transfer level: Needs  assistance Equipment used: None Transfers: Sit to/from Stand Sit to Stand: Supervision                  Balance Overall balance assessment: Needs assistance Sitting-balance support: Feet supported, No upper extremity supported Sitting balance-Leahy Scale: Good                                     ADL either performed or assessed with clinical judgement   ADL Overall ADL's : Needs assistance/impaired Eating/Feeding: Independent   Grooming: Wash/dry face;Wash/dry hands;Supervision/safety;Standing   Upper Body Bathing: Independent;Sitting   Lower Body Bathing: Supervison/ safety   Upper Body Dressing : Independent;Sitting   Lower Body Dressing: Supervision/safety   Toilet Transfer: Contact guard assist   Toileting- Clothing Manipulation and Hygiene: Contact guard assist       Functional mobility during ADLs: Supervision/safety;Contact guard assist       Vision Baseline Vision/History: 1 Wears glasses Patient Visual Report: No change from baseline Vision Assessment?: No apparent visual deficits     Perception Perception: Within Functional Limits       Praxis Praxis: WFL       Pertinent Vitals/Pain Pain Assessment Pain Assessment: No/denies pain     Extremity/Trunk Assessment Upper Extremity Assessment Upper Extremity Assessment: Generalized weakness   Lower Extremity Assessment Lower Extremity Assessment: Defer to PT evaluation   Cervical / Trunk Assessment Cervical / Trunk Assessment: Normal   Communication Communication Communication: No apparent difficulties   Cognition Arousal: Alert Behavior During Therapy: WFL for tasks assessed/performed Cognition: No apparent impairments  Following commands: Intact       Cueing  General Comments          Exercises     Shoulder Instructions      Home Living Family/patient expects to be discharged to:: Private residence Living  Arrangements: Alone Available Help at Discharge: Family;Available 24 hours/day;Available PRN/intermittently Type of Home: House Home Access: Stairs to enter [employer] of Steps: 2   Home Layout: One level     Bathroom Shower/Tub: Producer, television/film/video: Standard     Home Equipment: Shower seat   Additional Comments:  (patient reports that she may go stay with her sister for a few days upon DC.  Sisters house is 1 level and has a stairlift)      Prior Functioning/Environment Prior Level of Function : Independent/Modified Independent                    OT Problem List: Decreased strength;Decreased activity tolerance   OT Treatment/Interventions: Self-care/ADL training;Therapeutic exercise;Energy conservation;Patient/family education      OT Goals(Current goals can be found in the care plan section)   Acute Rehab OT Goals OT Goal Formulation: With patient Time For Goal Achievement: 09/06/24 Potential to Achieve Goals: Good   OT Frequency:  Min 2X/week    Co-evaluation              AM-PAC OT 6 Clicks Daily Activity     Outcome Measure Help from another person eating meals?: None Help from another person taking care of personal grooming?: A Little Help from another person toileting, which includes using toliet, bedpan, or urinal?: A Little Help from another person bathing (including washing, rinsing, drying)?: A Little Help from another person to put on and taking off regular upper body clothing?: None Help from another person to put on and taking off regular lower body clothing?: A Little 6 Click Score: 20   End of Session Nurse Communication: Mobility status  Activity Tolerance: Patient tolerated treatment well Patient left: in bed;with call bell/phone within reach  OT Visit Diagnosis: Unsteadiness on feet (R26.81);Muscle weakness (generalized) (M62.81)                Time: 8964-8941 OT Time Calculation (min): 23  min Charges:  OT General Charges $OT Visit: 1 Visit OT Evaluation $OT Eval Moderate Complexity: 1 Mod OT Treatments $Self Care/Home Management : 8-22 mins  Lamarr Pouch OT/L  Lamarr JONETTA Pouch 08/23/2024, 1:26 PM

## 2024-08-23 NOTE — Progress Notes (Signed)
 2 Days Post-Op  Subjective: CC: Patient reports she is sore at her incisions and pain is well controlled. Tolerating cld without n/v. G-tube remained clamped yesterday. No dysphagia or symptoms of liquids getting stuck in her chest. Passing flatus. No BM. Voiding. Mobilizing.   Afebrile. HR 100. No hypotension. WBC wnl. Hgb 9.1 from 10. Cr wnl.   Objective: Vital signs in last 24 hours: Temp:  [98.1 F (36.7 C)-99.2 F (37.3 C)] 99.2 F (37.3 C) (08/27 0431) Pulse Rate:  [82-100] 100 (08/27 0431) Resp:  [17-18] 18 (08/27 0431) BP: (138-150)/(81-95) 150/89 (08/27 0431) SpO2:  [97 %-100 %] 97 % (08/27 0431) Last BM Date : 08/18/24  Intake/Output from previous day: 08/26 0701 - 08/27 0700 In: 4442.2 [P.O.:727; I.V.:3715.2] Out: 0  Intake/Output this shift: No intake/output data recorded.  PE: Gen:  Alert, NAD, pleasant Abd: Soft, mild distension, appropriately tender around laparoscopic incisions, no rigidity or guarding and otherwise NT, +BS. Incisions with glue intact appears well and are without drainage, bleeding, or signs of infection. G-tube clamped  Lab Results:  Recent Labs    08/22/24 0244 08/23/24 0219  WBC 10.1 7.2  HGB 10.0* 9.1*  HCT 29.6* 26.8*  PLT 202 200   BMET Recent Labs    08/22/24 0244 08/23/24 0219  NA 134* 133*  K 4.6 4.0  CL 100 101  CO2 26 24  GLUCOSE 126* 95  BUN 8 8  CREATININE 0.94 0.95  CALCIUM  9.5 8.7*   PT/INR No results for input(s): LABPROT, INR in the last 72 hours. CMP     Component Value Date/Time   NA 133 (L) 08/23/2024 0219   NA 133 (L) 11/09/2022 1006   K 4.0 08/23/2024 0219   CL 101 08/23/2024 0219   CO2 24 08/23/2024 0219   GLUCOSE 95 08/23/2024 0219   BUN 8 08/23/2024 0219   BUN 40 (H) 11/09/2022 1006   CREATININE 0.95 08/23/2024 0219   CALCIUM  8.7 (L) 08/23/2024 0219   PROT 4.9 (L) 08/23/2024 0219   ALBUMIN  2.7 (L) 08/23/2024 0219   AST 28 08/23/2024 0219   ALT 27 08/23/2024 0219   ALKPHOS 42  08/23/2024 0219   BILITOT 0.9 08/23/2024 0219   GFRNONAA >60 08/23/2024 0219   GFRAA 53 (L) 08/23/2018 1147   Lipase     Component Value Date/Time   LIPASE 69 (H) 08/19/2024 1601    Studies/Results: No results found.  Anti-infectives: Anti-infectives (From admission, onward)    Start     Dose/Rate Route Frequency Ordered Stop   08/21/24 0600  ceFAZolin  (ANCEF ) IVPB 2g/100 mL premix        2 g 200 mL/hr over 30 Minutes Intravenous On call to O.R. 08/20/24 0744 08/21/24 0911        Assessment/Plan POD 2 s/p laparoscopic paraesophageal hernia repair, laparoscopic Toupet fundoplication, laparoscopic g tube insertion by Dr. Stevie on 8/25 for hiatal hernia with volvulus - Advance to FLD + shakes. Plan to d/c on FLD and shakes.  - If develops any worsening abdominal pain, distension, n/v can make NPO and place G-tube to gravity.  - When tolerating FLD and having bowel function, she will be okay for d/c from our standpoint.  - AM labs  - Mobilize, pulm toilet  FEN - FLD, G-tube clamped, PPI, IVF per primary, replace Mg VTE - SCDs, Lovenox  ID - Ancef  peri-op. None currently.  Foley - None, spont void Plan - As above.    LOS: 4 days  Ozell CHRISTELLA Shaper, [facility] 08/23/2024, 8:32 AM Please see Amion for pager number during day hours 7:00am-4:30pm

## 2024-08-25 LAB — PTH, INTACT AND CALCIUM
Calcium, Total (PTH): 9.4 mg/dL (ref 8.7–10.3)
PTH: 13 pg/mL — ABNORMAL LOW (ref 15–65)

## 2024-08-29 ENCOUNTER — Encounter

## 2024-09-01 DIAGNOSIS — D649 Anemia, unspecified: Secondary | ICD-10-CM | POA: Diagnosis not present

## 2024-09-01 DIAGNOSIS — N1831 Chronic kidney disease, stage 3a: Secondary | ICD-10-CM | POA: Diagnosis not present

## 2024-09-01 DIAGNOSIS — I272 Pulmonary hypertension, unspecified: Secondary | ICD-10-CM | POA: Diagnosis not present

## 2024-09-01 DIAGNOSIS — E871 Hypo-osmolality and hyponatremia: Secondary | ICD-10-CM | POA: Diagnosis not present

## 2024-09-01 DIAGNOSIS — R748 Abnormal levels of other serum enzymes: Secondary | ICD-10-CM | POA: Diagnosis not present

## 2024-09-01 DIAGNOSIS — E559 Vitamin D deficiency, unspecified: Secondary | ICD-10-CM | POA: Diagnosis not present

## 2024-09-01 DIAGNOSIS — K449 Diaphragmatic hernia without obstruction or gangrene: Secondary | ICD-10-CM | POA: Diagnosis not present

## 2024-09-01 DIAGNOSIS — E440 Moderate protein-calorie malnutrition: Secondary | ICD-10-CM | POA: Diagnosis not present

## 2024-09-04 ENCOUNTER — Telehealth: Payer: Self-pay

## 2024-09-04 NOTE — Telephone Encounter (Signed)
 Alert remote transmission: AF 10 AF events longest x 30 min.  Some false AF with PACs and cannot exclude times of true AF.  At times P wave challenging to discern.  Not on OAC.  To triage, high priority.  New finding.    Confirmed appears periods of new onset AF with in office provider as periods of no discernable p waves are present.  In comparison to prior false event EGMS, these look different.    Numerous AF events on 8/23 and 8/24. Discussed with patient.  She was in the hospital following a hernia repair at this time.  She is not feeling confident over events being real given her hx of false AF events, also was confused on how the device reported as she was told from our office that the device was off.   I explained that her device continues to work, it was her remote monitor that was not transmitting;however, it did send today and is working well.   She did not want me to move forward with AF clinic referral and is not interested in doing anything before her hernia follow up on 9/25.  Discussed risks of clot development with AF and given CVA hx likely need to start OAC.  She is not wanting to start any treatment at this time.   Is aware that I will forward to Dr. Cindie to review further and follow up with next recommendations.

## 2024-09-06 ENCOUNTER — Telehealth: Payer: Self-pay

## 2024-09-06 NOTE — Telephone Encounter (Signed)
 Spoke to patient advising Dr. Cindie recommends AF clinic referral to discuss starting OAC.   Patient/family advises pt had recent emergent hernia surgery, tube currently in abdomen and unable to eat (possible feeding tube) and would like to discuss with general surgeon and pcp prior to seeing AF clinic. Advised patient the AF clinic apt would allow the PA to review her care with her and discuss with other providers risk/benefits of starting OAC to reduce risk of CVA. Patient declined and would like to have tube remove prior to seeing AF clinic or starting OAC. Pt stated she would call back after 08/21/24. Advised I will forward to Dr. Cindie to advise.

## 2024-09-06 NOTE — Telephone Encounter (Signed)
 Please see note below from Dr Abran: Abran Norleen SAILOR, MD to Me (Selected Message)     09/06/24 10:46 AM [name], Please get her sister's name. Please have her sister see one of the advanced practitioners within the next few weeks.  Of course, I would be supervising with her evaluation. Thanks, Dr. Abran [name],   Thank you and Dr. Abran.  Her name is Felicia Washington and she hasn't seen Dr. Abran for several years.  Her Birth Date is 1946-11-09.  She goes back on 09/21/24 to her surgeon. So, anytime after that date works for her.   Thanks, [name]  Pt scheduled to see [name] PA 09/25/24 at 1:30pm. Pts sister to notify pt of appt.

## 2024-09-12 ENCOUNTER — Telehealth: Payer: Self-pay | Admitting: Cardiology

## 2024-09-12 ENCOUNTER — Ambulatory Visit: Attending: Student | Admitting: Student

## 2024-09-12 ENCOUNTER — Encounter: Payer: Self-pay | Admitting: Student

## 2024-09-12 VITALS — BP 100/66 | HR 51 | Ht 65.0 in | Wt 128.2 lb

## 2024-09-12 DIAGNOSIS — I639 Cerebral infarction, unspecified: Secondary | ICD-10-CM | POA: Diagnosis not present

## 2024-09-12 DIAGNOSIS — I480 Paroxysmal atrial fibrillation: Secondary | ICD-10-CM | POA: Diagnosis not present

## 2024-09-12 NOTE — Telephone Encounter (Signed)
 Spoke with pt regarding an appointment. Pt stated she called yesterday to get an appointment that day because her PCP stated she was in danger of having a heart attack and that he was going to contact Dr. Cindie. Pt was given an appointment today with Jodie Passey, PA-C and was upset that she only had 20 minutes before she would be considered a no show. Pt was told that is the policy. Pt verbalized understanding. All questions if any were answered.

## 2024-09-12 NOTE — Telephone Encounter (Signed)
 Error

## 2024-09-12 NOTE — Telephone Encounter (Signed)
 Pt calling saying Dr Nichole was suppose to speak to Dr Cindie about getting patient in asap today. Please advise.

## 2024-09-12 NOTE — Telephone Encounter (Signed)
 Patient wants a call back to discuss next steps per Dr. Cindie.  Patient has appointment scheduled at 12:00 noon today with A. Tillery.

## 2024-09-12 NOTE — Patient Instructions (Addendum)
 Medication Instructions:  Your physician recommends that you continue on your current medications as directed. Please refer to the Current Medication list given to you today.  *If you need a refill on your cardiac medications before your next appointment, please call your pharmacy*  Lab Work: None ordered If you have labs (blood work) drawn today and your tests are completely normal, you will receive your results only by: MyChart Message (if you have MyChart) OR A paper copy in the mail If you have any lab test that is abnormal or we need to change your treatment, we will call you to review the results.  Follow-Up: At [facility], you and your health needs are our priority.  As part of our continuing mission to provide you with exceptional heart care, our providers are all part of one team.  This team includes your primary Cardiologist (physician) and Advanced Practice Providers or APPs (Physician Assistants and Nurse Practitioners) who all work together to provide you with the care you need, when you need it.  Your next appointment:   2-3 week(s)  Provider:   You will follow up in the Atrial Fibrillation Clinic located here in this building. Your provider will be: Clint R. Fenton, PA-C or Fairy Heinrich, PA-C

## 2024-09-12 NOTE — Progress Notes (Signed)
  Electrophysiology Office Note:   Date:  09/12/2024  ID:  Felicia Washington, DOB 1946-11-29, MRN [id number]  Primary Cardiologist: Gordy Bergamo, MD Electrophysiologist: OLE ONEIDA HOLTS, MD   Electrophysiologist:  OLE ONEIDA HOLTS, MD      History of Present Illness:   Felicia Washington is a 78 y.o. female with h/o cryptogenic stroke, HTN, PACs, and hernia s/p repair seen today for acute visit due to new AF noted on ILR.    Of noted, admitted 8/22-8/27/2025 and abd pain. 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  Following admission she underwent fundoplication with hernia repair and G-tube insertion 8/25.   Patient reports doing OK. She see's GI next week with potential plans to remove G-tube. She verbalizes understanding of recommendation for [facility], and would prefer to delay until after her GI issues have resolved. Otherwise, she denies chest pain, palpitations, dyspnea, PND, orthopnea, dizziness, syncope, edema, weight gain, or early satiety.   Review of systems complete and found to be negative unless listed in HPI.   EP Information / Studies Reviewed:    EKG is not ordered today. EKG from 08/18/2024 reviewed which showed NSR at 85 bpm       Arrhythmia/Device History BSX ILR LATITUDE -CL   Physical Exam:   VS:  BP 100/66   Pulse (!) 51   Ht 5' 5 (1.651 m)   Wt 128 lb 3.2 oz (58.2 kg)   SpO2 99%   BMI 21.33 kg/m    Wt Readings from Last 3 Encounters:  09/12/24 128 lb 3.2 oz (58.2 kg)  08/21/24 131 lb (59.4 kg)  04/02/24 131 lb (59.4 kg)     GEN: No acute distress NECK: No JVD; No carotid bruits CARDIAC: Regular rate and rhythm, no murmurs, rubs, gallops RESPIRATORY:  Clear to auscultation without rales, wheezing or rhonchi  ABDOMEN: Soft, non-tender, non-distended EXTREMITIES:  No edema; No deformity   ASSESSMENT AND PLAN:    Cryptogenic stroke s/p BSx loop recorder AF has now been identified.    CHA2DS2/VASc at least 6. Recommend starting OAC once her G-tube is removed next week. Pt declines to start sooner.   She hasn't had additional episodes; She verbalizes understanding that if she DOES, we would recommend starting OAC sooner to help protect from stroke.   Will follow up in AF clinic in 2-3 weeks to see post G tube removal to start on OAC.    Follow up with Afib Clinic in 2-3 weeks  Signed, Ozell Prentice Passey, PA-C

## 2024-09-21 ENCOUNTER — Ambulatory Visit (INDEPENDENT_AMBULATORY_CARE_PROVIDER_SITE_OTHER)

## 2024-09-21 DIAGNOSIS — I639 Cerebral infarction, unspecified: Secondary | ICD-10-CM

## 2024-09-21 DIAGNOSIS — K8020 Calculus of gallbladder without cholecystitis without obstruction: Secondary | ICD-10-CM

## 2024-09-21 HISTORY — DX: Calculus of gallbladder without cholecystitis without obstruction: K80.20

## 2024-09-21 LAB — CUP PACEART REMOTE DEVICE CHECK
Date Time Interrogation Session: 20250925010200
Implantable Pulse Generator Implant Date: 20230214
Pulse Gen Serial Number: 172346

## 2024-09-25 ENCOUNTER — Ambulatory Visit: Admitting: Physician Assistant

## 2024-09-25 ENCOUNTER — Other Ambulatory Visit (INDEPENDENT_AMBULATORY_CARE_PROVIDER_SITE_OTHER)

## 2024-09-25 ENCOUNTER — Encounter: Payer: Self-pay | Admitting: Physician Assistant

## 2024-09-25 ENCOUNTER — Telehealth: Payer: Self-pay

## 2024-09-25 ENCOUNTER — Other Ambulatory Visit: Payer: Self-pay

## 2024-09-25 VITALS — BP 104/66 | HR 65 | Ht 65.0 in | Wt 126.8 lb

## 2024-09-25 DIAGNOSIS — D649 Anemia, unspecified: Secondary | ICD-10-CM | POA: Diagnosis not present

## 2024-09-25 DIAGNOSIS — K900 Celiac disease: Secondary | ICD-10-CM

## 2024-09-25 DIAGNOSIS — Z860100 Personal history of colon polyps, unspecified: Secondary | ICD-10-CM

## 2024-09-25 DIAGNOSIS — Z860101 Personal history of adenomatous and serrated colon polyps: Secondary | ICD-10-CM | POA: Diagnosis not present

## 2024-09-25 DIAGNOSIS — E876 Hypokalemia: Secondary | ICD-10-CM

## 2024-09-25 LAB — CBC WITH DIFFERENTIAL/PLATELET
Basophils Absolute: 0 K/uL (ref 0.0–0.1)
Basophils Relative: 0.5 % (ref 0.0–3.0)
Eosinophils Absolute: 0.1 K/uL (ref 0.0–0.7)
Eosinophils Relative: 2 % (ref 0.0–5.0)
HCT: 33.1 % — ABNORMAL LOW (ref 36.0–46.0)
Hemoglobin: 11.3 g/dL — ABNORMAL LOW (ref 12.0–15.0)
Lymphocytes Relative: 24.7 % (ref 12.0–46.0)
Lymphs Abs: 1.6 K/uL (ref 0.7–4.0)
MCHC: 34.2 g/dL (ref 30.0–36.0)
MCV: 86.9 fl (ref 78.0–100.0)
Monocytes Absolute: 0.5 K/uL (ref 0.1–1.0)
Monocytes Relative: 7.7 % (ref 3.0–12.0)
Neutro Abs: 4.4 K/uL (ref 1.4–7.7)
Neutrophils Relative %: 65.1 % (ref 43.0–77.0)
Platelets: 274 K/uL (ref 150.0–400.0)
RBC: 3.81 Mil/uL — ABNORMAL LOW (ref 3.87–5.11)
RDW: 14.5 % (ref 11.5–15.5)
WBC: 6.7 K/uL (ref 4.0–10.5)

## 2024-09-25 LAB — BASIC METABOLIC PANEL WITH GFR
BUN: 17 mg/dL (ref 6–23)
CO2: 27 meq/L (ref 19–32)
Calcium: 10 mg/dL (ref 8.4–10.5)
Chloride: 93 meq/L — ABNORMAL LOW (ref 96–112)
Creatinine, Ser: 1.03 mg/dL (ref 0.40–1.20)
GFR: 52.97 mL/min — ABNORMAL LOW (ref >60.00)
Glucose, Bld: 105 mg/dL — ABNORMAL HIGH (ref 70–99)
Potassium: 2.8 meq/L — CL (ref 3.5–5.1)
Sodium: 130 meq/L — ABNORMAL LOW (ref 135–145)

## 2024-09-25 MED ORDER — POTASSIUM CHLORIDE CRYS ER 20 MEQ PO TBCR: 20.0000 meq | EXTENDED_RELEASE_TABLET | Freq: Every day | ORAL | 0 refills | Status: DC

## 2024-09-25 NOTE — Telephone Encounter (Signed)
 Appointment 10/03/24 with DOROTHA Heinrich PA-C in Afib clinic.

## 2024-09-25 NOTE — Addendum Note (Signed)
 Addended by: CLAUDENE NAOMIE SAILOR on: 09/25/2024 05:06 PM   Modules accepted: Orders

## 2024-09-25 NOTE — Telephone Encounter (Signed)
 Critical result called from the lab on pt, Potassium 2.8. [name] PA notified,

## 2024-09-25 NOTE — Telephone Encounter (Signed)
 Spoke to patient's daughter, Cloretta to advise of low potassium with need for potassium supplementation once daily x 30 days. Discussed foods high in potassium and recommended she eat these. Also asked that patient return for potassium redraw on Friday, 09/29/24. Orders entered in EPIC. Cloretta is advised that if patient develops any heart palpitations, arhythmia, dizziness, syncope over the next several days, she should go immediately to ER. She verbalizes understanding.

## 2024-09-25 NOTE — Progress Notes (Signed)
 Remote Loop Recorder Transmission

## 2024-09-25 NOTE — Progress Notes (Signed)
 Felicia Console, PA-C [address] [place], [place]  [address] Phone: [phone]   Primary Care Physician: Nichole Senior, MD  Primary Gastroenterologist:  Felicia Console, PA-C / Norleen Kiang, MD   Chief Complaint: Follow-up celiac, history of adenomatous colon polyps, anemia   HPI:   Felicia Washington is a 78 y.o. female presents for evaluation of anemia, celiac.  Here today with her 2 daughters.  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  Not currently taking iron or B12.  She used to take prenatal vitamin.    Labs 08/23/2024 showed hemoglobin 9.1, hematocrit 26, MCV 90, BUN 8, creatinine 0.95.  Normal LFTs.  Of note, she was admitted 8/22-8/27/2025 for abd pain. 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  Following admission she underwent fundoplication with hernia repair and G-tube insertion 8/25.  She followed up with surgeon Dr. Stevie 09/21/24 and her G-tube was removed.  She is gradually advancing her diet as tolerated. She does not smoke and has no heartburn, reflux, or indigestion. She has been taking an antacid, but its regular use is unclear. She has no current symptoms of nausea or stomach ache and tolerates rich foods well.   She last saw Greig Corti, PA-C in our office in 2017 to evaluate diarrhea.  Established patient Dr. Kiang.  Has history of celiac, diverticulosis, and adenomatous colon polyps.  02/2016 last colonoscopy by Dr. Kiang: Pandiverticulosis, otherwise normal.  No polyps.  Excellent prep.  10-year repeat (due 02/2026).  2008 colonoscopy: Diverticulosis, 2 small tubular adenoma polyps removed.  2008 EGD mild esophagitis.  Normal duodenum.  Hiatal hernia.  Continued on Nexium and gluten-free diet.  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  PMH: Hx atrial fibrillation, CVA (2023), HTN, celiac disease, GERD with esophagitis, adenomatous colon polyps, diverticulosis,  hypothyroidism, CKD, LVEF 60 to 65%.  Current Outpatient Medications  Medication Sig Dispense Refill   acetaminophen  (TYLENOL ) 500 MG tablet Take 1,000 mg by mouth every 6 (six) hours as needed for mild pain (pain score 1-3).     albuterol  (VENTOLIN  HFA) 108 (90 Base) MCG/ACT inhaler Inhale 2 puffs into the lungs every 8 (eight) hours as needed for shortness of breath or wheezing.     amLODipine  (NORVASC ) 10 MG tablet Take 1 tablet (10 mg total) by mouth every morning. 90 tablet 3   aspirin  EC 81 MG tablet Take 1 tablet (81 mg total) by mouth in the morning. Swallow whole.     atorvastatin  (LIPITOR) 40 MG tablet Take 40 mg by mouth in the morning.     buPROPion  (WELLBUTRIN  XL) 150 MG 24 hr tablet Take 150 mg by mouth in the morning. (Patient taking differently: Take 75 mg by mouth in the morning.)     chlorthalidone  (HYGROTON ) 25 MG tablet Take 25 mg by mouth every morning.     clonazePAM (KLONOPIN) 0.5 MG tablet Take 0.5 mg by mouth at bedtime.     famotidine  (PEPCID ) 20 MG tablet Take 20 mg by mouth at bedtime.     Febuxostat  80 MG TABS Take 80 mg by mouth at bedtime.     fenofibrate  micronized (LOFIBRA) 67 MG capsule Take 67 mg by mouth in the morning.  5   levothyroxine  (SYNTHROID , LEVOTHROID) 112 MCG tablet Take 56-224 mcg by mouth See admin instructions. Take 1 tablets (112 mcg) by mouth on Mondays through Saturdays before breakfast. Take 2tablest (224 mcg) by mouth on Sundays before breakfast.  metoprolol  succinate (TOPROL  XL) 50 MG 24 hr tablet Take 1 tablet (50 mg total) by mouth at bedtime. 90 tablet 3   montelukast  (SINGULAIR ) 10 MG tablet Take 10 mg by mouth at bedtime.     olmesartan  (BENICAR ) 40 MG tablet Take 1 tablet (40 mg total) by mouth every evening. 90 tablet 3   potassium chloride  (KLOR-CON  M) 10 MEQ tablet Take 10 mEq by mouth daily with breakfast.     oxyCODONE  (OXY IR/ROXICODONE ) 5 MG immediate release tablet Take 1 tablet (5 mg total) by mouth every 4 (four) hours as  needed for moderate pain (pain score 4-6). (Patient not taking: Reported on 09/25/2024) 20 tablet 0   No current facility-administered medications for this visit.    Allergies as of 09/25/2024 - Review Complete 09/25/2024  Allergen Reaction Noted   Adhesive [tape] Other (See Comments) 08/16/2018   Allopurinol  Swelling, Hives, and Itching 09/27/2019   Codeine Nausea Only 01/03/2009   Latex Other (See Comments) 09/27/2019    Past Medical History:  Diagnosis Date   Anemia    Arthritis    fingers, shoulders (09/01/2018)   Asthma    Celiac disease    GERD (gastroesophageal reflux disease)    Graves disease    Heart murmur    Hiatal hernia    History of blood transfusion    related to c-section   History of gout    fingers   HLD (hyperlipidemia)    HTN (hypertension)    Hypothyroidism    Migraine    1 q couple years (09/01/2018)   Pneumonia    several times in 1 yr (09/01/2018)   PONV (postoperative nausea and vomiting)    Seasonal allergies    Stroke [facility])    jan 2023    Past Surgical History:  Procedure Laterality Date   BUBBLE STUDY  09/23/2022   Procedure: BUBBLE STUDY;  Surgeon: Michele Richardson, DO;  Location: [facility];  Service: Cardiovascular;;   CESAREAN SECTION  1975   COLONOSCOPY     HIATAL HERNIA REPAIR N/A 08/21/2024   Procedure: REPAIR, HERNIA, HIATAL, LAPAROSCOPIC;  Surgeon: Kinsinger, Herlene Righter, MD;  Location: [facility] OR;  Service: General;  Laterality: N/A;   LAPAROSCOPIC GASTROSTOMY N/A 08/21/2024   Procedure: CREATION, GASTROSTOMY, LAPAROSCOPIC;  Surgeon: Stevie Herlene Righter, MD;  Location: [facility] OR;  Service: General;  Laterality: N/A;   POLYPECTOMY     REVERSE SHOULDER ARTHROPLASTY Right 09/01/2018   REVERSE SHOULDER ARTHROPLASTY Right 09/01/2018   Procedure: RIGHT REVERSE SHOULDER ARTHROPLASTY;  Surgeon: Melita Drivers, MD;  Location: [facility] OR;  Service: Orthopedics;  Laterality: Right;   SHOULDER ARTHROSCOPY W/ ROTATOR CUFF REPAIR Bilateral    TEE WITHOUT  CARDIOVERSION N/A 09/23/2022   Procedure: TRANSESOPHAGEAL ECHOCARDIOGRAM (TEE);  Surgeon: Michele Richardson, DO;  Location: [facility];  Service: Cardiovascular;  Laterality: N/A;   THYROIDECTOMY     TONSILLECTOMY      Review of Systems:    All systems reviewed and negative except where noted in HPI.    Physical Exam:  BP 104/66   Pulse 65   Ht 5' 5 (1.651 m)   Wt 126 lb 12.8 oz (57.5 kg)   SpO2 99%   BMI 21.10 kg/m  No LMP recorded. Patient is postmenopausal.  General: Well-nourished, elderly, deconditioned, mildly ill-appearing female, in no acute distress.  Lungs: Clear to auscultation bilaterally. Non-labored. Heart: Regular rate and rhythm, no murmurs rubs or gallops.  Abdomen: Bowel sounds are normal; Abdomen is Soft; No hepatosplenomegaly, masses or  hernias; small laparoscopic incisions which are well-healing in the mid upper abdomen, mildly tender.  No lower abdominal Tenderness; No guarding or rebound tenderness. Neuro: Alert and oriented x 3.  Grossly intact.  Psych: Alert and cooperative, depressed mood and affect. Skin: Mildly pale; no rashes   Imaging Studies: CUP PACEART REMOTE DEVICE CHECK Result Date: 09/21/2024 ILR summary report received. Battery status OK. Normal device function. No new symptom, tachy, brady, or pause episodes.  Monthly summary reports and ROV/PRN 10 AF events longest x 30 min.  Some false AF with PACs and cannot exclude times of true AF.  At times P wave challenging to discern.  Not on OAC.  To triage, high priority.  New finding.  Previously documented [facility] 09/12/24, OAC recommended, pt declined until G-tube removed next week per notes 2 new AF events 9/24 @ 18:05 and 18:17, longest duration , HR's 113-170 - Route to triage high alert per protocol LA, CVRS2 red event alerts   Labs: CBC    Component Value Date/Time   WBC 7.2 08/23/2024 0219   RBC 2.95 (L) 08/23/2024 0219   HGB 9.1 (L) 08/23/2024 0219   HCT 26.8 (L) 08/23/2024 0219   PLT  200 08/23/2024 0219   MCV 90.8 08/23/2024 0219   MCH 30.8 08/23/2024 0219   MCHC 34.0 08/23/2024 0219   RDW 12.5 08/23/2024 0219   LYMPHSABS 1.4 08/23/2024 0219   MONOABS 0.9 08/23/2024 0219   EOSABS 0.3 08/23/2024 0219   BASOSABS 0.0 08/23/2024 0219    CMP     Component Value Date/Time   NA 133 (L) 08/23/2024 0219   NA 133 (L) 11/09/2022 1006   K 4.0 08/23/2024 0219   CL 101 08/23/2024 0219   CO2 24 08/23/2024 0219   GLUCOSE 95 08/23/2024 0219   BUN 8 08/23/2024 0219   BUN 40 (H) 11/09/2022 1006   CREATININE 0.95 08/23/2024 0219   CALCIUM  8.7 (L) 08/23/2024 0219   CALCIUM  9.4 08/20/2024 0548   PROT 4.9 (L) 08/23/2024 0219   ALBUMIN  2.7 (L) 08/23/2024 0219   AST 28 08/23/2024 0219   ALT 27 08/23/2024 0219   ALKPHOS 42 08/23/2024 0219   BILITOT 0.9 08/23/2024 0219   GFRNONAA >60 08/23/2024 0219   GFRAA 53 (L) 08/23/2018 1147     Assessment and Plan:   CHIVONNE RASCON is a 77 y.o. y/o female presents for follow-up of:  1.  Celiac disease -Follow-up celiac labs:  TTG, DGP(deamidated gliadin peptide), vitamins B12, folic acid, ferritin, and iron.  - Advised patient to talk with her PCP to order bone Density test to screen for osteoporosis. -Continue Gluten Free Diet  2.  Anemia - Labs: CBC, iron panel, ferritin, B12, folate, and celiac lab.  - Pending lab results, then decide about starting iron, B12, or other vitamin supplements. - Pending lab results decide about scheduling EGD and colonoscopy. - Patient wants to recover from recent diaphragmatic hernia surgery before scheduling EGD and colonoscopy.  We will continue to follow.  3.  Diarrhea, currently resolved.  Suspect due to being on liquid diet.  Improving. - If she has recurrent diarrhea, then I will order stool studies (C. difficile, GI pathogen panel).  Patient and daughters are aware to let me know if she has any recurrent diarrhea.  4.  Recent diaphragmatic hernia repair; currently healing and followed by  surgery.  G-tube recently removed.  5.  Colon cancer screening  - 10-year repeat screening colonoscopy was recommended due 02/2026. - Last  colonoscopy 02/2016 showed no polyps. - Colonoscopy in 2008 showed 2 small adenomatous polyps removed.   Felicia Console, PA-C  Follow up 2 months with TG.  Follow-up sooner if worsening GI symptoms.

## 2024-09-25 NOTE — Progress Notes (Signed)
 Hold off on procedures, unless critically important. She needs to convalesce

## 2024-09-25 NOTE — Patient Instructions (Signed)
 Your provider has requested that you go to the basement level for lab work before leaving today. Press B on the elevator. The lab is located at the first door on the left as you exit the elevator.  Please follow up sooner if symptoms increase or worsen  Due to recent changes in healthcare laws, you may see the results of your imaging and laboratory studies on MyChart before your provider has had a chance to review them.  We understand that in some cases there may be results that are confusing or concerning to you. Not all laboratory results come back in the same time frame and the provider may be waiting for multiple results in order to interpret others.  Please give us  48 hours in order for your provider to thoroughly review all the results before contacting the office for clarification of your results.   Thank you for trusting me with your gastrointestinal care!   [name], PA-C _______________________________________________________  If your blood pressure at your visit was 140/90 or greater, please contact your primary care physician to follow up on this.  _______________________________________________________  If you are age 92 or older, your body mass index should be between 23-30. Your Body mass index is 21.1 kg/m. If this is out of the aforementioned range listed, please consider follow up with your Primary Care Provider.  If you are age 49 or younger, your body mass index should be between 19-25. Your Body mass index is 21.1 kg/m. If this is out of the aformentioned range listed, please consider follow up with your Primary Care Provider.   ________________________________________________________  The [facility] providers would like to encourage you to use MYCHART to communicate with providers for non-urgent requests or questions.  Due to long hold times on the telephone, sending your provider a message by [facility] may be a faster and more efficient way to get a response.  Please  allow 48 business hours for a response.  Please remember that this is for non-urgent requests.  _______________________________________________________

## 2024-09-25 NOTE — Telephone Encounter (Signed)
 Attempted to reach pt and her daughter. Unable to reach pt, left message on VM. Unable to reach pt.

## 2024-09-26 ENCOUNTER — Ambulatory Visit: Payer: Self-pay | Admitting: Physician Assistant

## 2024-09-26 DIAGNOSIS — E876 Hypokalemia: Secondary | ICD-10-CM

## 2024-09-26 DIAGNOSIS — E871 Hypo-osmolality and hyponatremia: Secondary | ICD-10-CM

## 2024-09-26 LAB — IRON,TIBC AND FERRITIN PANEL
%SAT: 12 % — ABNORMAL LOW (ref 16–45)
Ferritin: 180 ng/mL (ref 16–288)
Iron: 41 ug/dL — ABNORMAL LOW (ref 45–160)
TIBC: 329 ug/dL (ref 250–450)

## 2024-09-26 LAB — FOLATE: Folate: 20.7 ng/mL (ref >5.9)

## 2024-09-26 LAB — VITAMIN B12: Vitamin B-12: 850 pg/mL (ref 211–911)

## 2024-09-27 ENCOUNTER — Telehealth: Payer: Self-pay | Admitting: Cardiology

## 2024-09-27 NOTE — Progress Notes (Signed)
 Remote Loop Recorder Transmission

## 2024-09-27 NOTE — Telephone Encounter (Signed)
 Patient is asking about her remote download message from 09/21/24- LR summary report received. Battery status OK. Normal device function. No new symptom, tachy, brady, or pause episodes.  Monthly summary reports and ROV/PRN  10 AF events longest x 30 min.  Some false AF with PACs and cannot exclude times of true AF.  At times P wave challenging to discern.  Not on OAC.  To triage, high priority.  New finding.  Previously documented [facility] 09/12/24, OAC recommended, pt declined  until G-tube removed next week per notes  2 new AF events 9/24 @ 18:05 and 18:17, longest duration , HR's 113-170 - Route to triage high alert per protocol  LA, CVRS2 red event alerts   Did not see where a provider has reviewed this information/given any recommendations. Informed the patient that I will send a message to our device team and the provider and they will get back in touch with pertinent information.  She verbalized understanding.

## 2024-09-27 NOTE — Telephone Encounter (Signed)
 Patient wants a call back regarding document which showed up in her MyChart on 9/25.

## 2024-09-27 NOTE — Telephone Encounter (Signed)
 Late Entry: 09/27/24 @ 4:00 PM.  Advised patient Dr. Cindie has not read remote transmission. Discussed results with patient in detail and advised patient if Dr. Cindie would like to make changes someone will reach out to her. Patient was appreciative of call.

## 2024-09-28 ENCOUNTER — Encounter

## 2024-09-28 LAB — CELIAC AB TTG DGP TIGA
Antigliadin Abs, IgA: 14 U (ref 0–19)
Gliadin IgG: 18 U (ref 0–19)
IgA/Immunoglobulin A, Serum: 121 mg/dL (ref 64–422)
Tissue Transglut Ab: 2 U/mL (ref 0–5)
Transglutaminase IgA: <2 U/mL (ref 0–3)

## 2024-09-29 ENCOUNTER — Ambulatory Visit: Payer: Self-pay | Admitting: Physician Assistant

## 2024-09-29 ENCOUNTER — Telehealth: Payer: Self-pay | Admitting: Physician Assistant

## 2024-09-29 ENCOUNTER — Other Ambulatory Visit

## 2024-09-29 ENCOUNTER — Ambulatory Visit: Payer: Self-pay | Admitting: Cardiology

## 2024-09-29 DIAGNOSIS — R197 Diarrhea, unspecified: Secondary | ICD-10-CM

## 2024-09-29 DIAGNOSIS — E871 Hypo-osmolality and hyponatremia: Secondary | ICD-10-CM

## 2024-09-29 DIAGNOSIS — E876 Hypokalemia: Secondary | ICD-10-CM | POA: Diagnosis not present

## 2024-09-29 LAB — BASIC METABOLIC PANEL WITH GFR
BUN: 13 mg/dL (ref 6–23)
CO2: 22 meq/L (ref 19–32)
Calcium: 9.2 mg/dL (ref 8.4–10.5)
Chloride: 104 meq/L (ref 96–112)
Creatinine, Ser: 0.69 mg/dL (ref 0.40–1.20)
GFR: 84.48 mL/min (ref >60.00)
Glucose, Bld: 239 mg/dL — ABNORMAL HIGH (ref 70–99)
Potassium: 4 meq/L (ref 3.5–5.1)
Sodium: 138 meq/L (ref 135–145)

## 2024-09-29 NOTE — Telephone Encounter (Signed)
 Inbound call from patients daughter stating mom is having diarrhea after results stated celiac was being controlled. Patients daughter stated in last visit they had mentioned c-diff testing, she would like to know if patient can have that done to see what's been going on. Patients daughter also wanted to know if the c-diff testing is something they pick up or do at home.  Requesting a call back  Please advise  Thank you

## 2024-09-29 NOTE — Telephone Encounter (Signed)
 Spoke with pts daughter and let her know we will order the test for cdiff. Daughter will come and pick up the kit. See result note.

## 2024-10-02 ENCOUNTER — Telehealth: Payer: Self-pay | Admitting: Physician Assistant

## 2024-10-02 NOTE — Telephone Encounter (Signed)
 Patient calls stating that she is no longer having diarrhea and wants to know if she still needs to provide stool sample for  C Diff. I advised that stool must be water/loose in order to run the test. She will hold off on completing testing at this time.

## 2024-10-02 NOTE — Telephone Encounter (Signed)
 Inbound call from patient stating she would like to speak to nurse in regards to stool sample kit. Requesting a call back  Please advise  Thank you

## 2024-10-03 ENCOUNTER — Other Ambulatory Visit

## 2024-10-03 ENCOUNTER — Telehealth: Payer: Self-pay | Admitting: Pharmacy Technician

## 2024-10-03 ENCOUNTER — Other Ambulatory Visit (HOSPITAL_COMMUNITY): Payer: Self-pay

## 2024-10-03 ENCOUNTER — Encounter (HOSPITAL_COMMUNITY): Payer: Self-pay | Admitting: Internal Medicine

## 2024-10-03 ENCOUNTER — Ambulatory Visit (HOSPITAL_COMMUNITY)
Admission: RE | Admit: 2024-10-03 | Discharge: 2024-10-03 | Disposition: A | Discharge status: Home / Self Care | Source: Ambulatory Visit | Attending: Internal Medicine | Admitting: Internal Medicine

## 2024-10-03 VITALS — BP 106/76 | HR 68 | Ht 65.0 in | Wt 124.4 lb

## 2024-10-03 DIAGNOSIS — D6869 Other thrombophilia: Secondary | ICD-10-CM | POA: Insufficient documentation

## 2024-10-03 DIAGNOSIS — I480 Paroxysmal atrial fibrillation: Secondary | ICD-10-CM | POA: Diagnosis not present

## 2024-10-03 DIAGNOSIS — R197 Diarrhea, unspecified: Secondary | ICD-10-CM | POA: Diagnosis not present

## 2024-10-03 MED ORDER — APIXABAN 5 MG PO TABS: 5.0000 mg | ORAL_TABLET | Freq: Two times a day (BID) | ORAL | 3 refills | Status: DC

## 2024-10-03 NOTE — Telephone Encounter (Signed)
 Pharmacy Patient Advocate Encounter  Insurance verification completed.   The patient is insured through [employer]   Ran test claim for ELiquis 5 MG. Currently a quantity of 60 is a 30 day supply and the co-pay is 284.37 . Patient may be eligible for a Medicare prescription Payment plan. The patient will need to reach out to their insurance company to enrol in the payment plan to spread out their payments throughout the year, If available.  This test claim was processed through [facility]- copay amounts may vary at other pharmacies due to pharmacy/plan contracts, or as the patient moves through the different stages of their insurance plan.

## 2024-10-03 NOTE — Telephone Encounter (Signed)
 PAP: Patient assistance application for Eliquis through [employer] has been mailed to pt's home address on file.  Provider portion of application will be uploaded to media

## 2024-10-03 NOTE — Patient Instructions (Addendum)
 Start Eliquis 5mg  twice a day  Stop aspirin    Get labwork in one month 11/03/24 at any [facility] You may go to any [facility] Location for your lab work:  [facility] - [address] ([facility]) - [address] [phone] [address]  [place] - [address]   [place]  - [address]   [place] - [address] - [address] [employer]  - [address] - [address] ([facility]    If you have any lab test that is abnormal or we need to change your treatment, we will call you

## 2024-10-03 NOTE — Progress Notes (Signed)
 Primary Care Physician: Felicia Senior, MD Primary Cardiologist: none Primary Electrophysiologist: Dr Felicia Washington Referring Physician: Dr Felicia Washington [name] is a 78 y.o. female with a history of HLD, HTN, hypothyroidism, GERD, Celiac disease, CVA who presents for follow up in the [facility]. 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 EP was consulted for ILR, patient deferred at that time. S/p ILR placement by Dr. Cindie on 02/10/22. She has a CHADS2VASC score of 6.   On follow up 10/03/24, patient is currently in NSR. Admitted 07/2024 for abdominal pain found to have hiatal hernia with high-grade gastric obstruction s/p G-tube. Seen by Jodie Passey, PA-C, on 9/16 with patient wishing to hold on starting OAC for new Afib found on ILR until after G-tube removed. She was seen by surgeon on 9/25 and G-tube was removed. Review of ILR shows most recent episode of Afib was on 9/24.   Today, she denies symptoms of palpitations, chest pain, shortness of breath, orthopnea, PND, lower extremity edema, dizziness, presyncope, syncope, snoring, daytime somnolence, bleeding, or neurologic sequela. The patient is tolerating medications without difficulties and is otherwise without complaint today.    Atrial Fibrillation Risk Factors:  she does not have symptoms or diagnosis of sleep apnea. she does not have a history of rheumatic fever.   she has a BMI of Body mass index is 20.7 kg/m.Felicia Washington Filed Weights   10/03/24 1134  Weight: 56.4 kg     Family History  Problem Relation Age of Onset   Thyroid  disease Mother    Gallstones Mother    Multiple myeloma Father    Bone cancer Father    Celiac disease Sister    Colon polyps Sister    Colon polyps Brother    Diabetes Maternal Grandmother    Heart attack Maternal Grandfather    Sleep apnea Daughter    Colon cancer Neg Hx      Atrial Fibrillation Management history:  Previous antiarrhythmic drugs:  none Previous cardioversions: none Previous ablations: none CHADS2VASC score: 6 Anticoagulation history: Eliquis   Past Medical History:  Diagnosis Date   Anemia    Arthritis    fingers, shoulders (09/01/2018)   Asthma    Celiac disease    GERD (gastroesophageal reflux disease)    Graves disease    Heart murmur    Hiatal hernia    History of blood transfusion    related to c-section   History of gout    fingers   HLD (hyperlipidemia)    HTN (hypertension)    Hypothyroidism    Migraine    1 q couple years (09/01/2018)   Pneumonia    several times in 1 yr (09/01/2018)   PONV (postoperative nausea and vomiting)    Seasonal allergies    Stroke [facility])    jan 2023   Past Surgical History:  Procedure Laterality Date   BUBBLE STUDY  09/23/2022   Procedure: BUBBLE STUDY;  Surgeon: Michele Richardson, DO;  Location: [facility] ENDOSCOPY;  Service: Cardiovascular;;   CESAREAN SECTION  1975   COLONOSCOPY     HIATAL HERNIA REPAIR N/A 08/21/2024   Procedure: REPAIR, HERNIA, HIATAL, LAPAROSCOPIC;  Surgeon: Kinsinger, Herlene Righter, MD;  Location: [facility] OR;  Service: General;  Laterality: N/A;   LAPAROSCOPIC GASTROSTOMY N/A 08/21/2024   Procedure: CREATION, GASTROSTOMY, LAPAROSCOPIC;  Surgeon: Stevie Herlene Righter, MD;  Location: [facility] OR;  Service: General;  Laterality: N/A;   POLYPECTOMY     REVERSE SHOULDER ARTHROPLASTY Right  09/01/2018   REVERSE SHOULDER ARTHROPLASTY Right 09/01/2018   Procedure: RIGHT REVERSE SHOULDER ARTHROPLASTY;  Surgeon: Melita Drivers, MD;  Location: [facility] OR;  Service: Orthopedics;  Laterality: Right;   SHOULDER ARTHROSCOPY W/ ROTATOR CUFF REPAIR Bilateral    TEE WITHOUT CARDIOVERSION N/A 09/23/2022   Procedure: TRANSESOPHAGEAL ECHOCARDIOGRAM (TEE);  Surgeon: Michele Richardson, DO;  Location: [facility] ENDOSCOPY;  Service: Cardiovascular;  Laterality: N/A;   THYROIDECTOMY     TONSILLECTOMY      Current Outpatient Medications  Medication Sig Dispense Refill   acetaminophen  (TYLENOL ) 500 MG  tablet Take 1,000 mg by mouth every 6 (six) hours as needed for mild pain (pain score 1-3).     albuterol  (VENTOLIN  HFA) 108 (90 Base) MCG/ACT inhaler Inhale 2 puffs into the lungs every 8 (eight) hours as needed for shortness of breath or wheezing.     amLODipine  (NORVASC ) 10 MG tablet Take 1 tablet (10 mg total) by mouth every morning. 90 tablet 3   aspirin  EC 81 MG tablet Take 1 tablet (81 mg total) by mouth in the morning. Swallow whole.     atorvastatin  (LIPITOR) 40 MG tablet Take 40 mg by mouth in the morning.     buPROPion  (WELLBUTRIN  XL) 150 MG 24 hr tablet Take 150 mg by mouth in the morning. (Patient taking differently: Take 75 mg by mouth in the morning.)     chlorthalidone  (HYGROTON ) 25 MG tablet Take 25 mg by mouth every morning.     clonazePAM (KLONOPIN) 0.5 MG tablet Take 0.5 mg by mouth at bedtime.     famotidine  (PEPCID ) 20 MG tablet Take 20 mg by mouth at bedtime.     Febuxostat  80 MG TABS Take 80 mg by mouth at bedtime.     fenofibrate  micronized (LOFIBRA) 67 MG capsule Take 67 mg by mouth in the morning.  5   levothyroxine  (SYNTHROID , LEVOTHROID) 112 MCG tablet Take 56-224 mcg by mouth See admin instructions. Take 1 tablets (112 mcg) by mouth on Mondays through Saturdays before breakfast. Take 2tablest (224 mcg) by mouth on Sundays before breakfast.     metoprolol  succinate (TOPROL  XL) 50 MG 24 hr tablet Take 1 tablet (50 mg total) by mouth at bedtime. 90 tablet 3   montelukast  (SINGULAIR ) 10 MG tablet Take 10 mg by mouth at bedtime.     olmesartan  (BENICAR ) 40 MG tablet Take 1 tablet (40 mg total) by mouth every evening. 90 tablet 3   oxyCODONE  (OXY IR/ROXICODONE ) 5 MG immediate release tablet Take 1 tablet (5 mg total) by mouth every 4 (four) hours as needed for moderate pain (pain score 4-6). 20 tablet 0   potassium chloride  SA (KLOR-CON  M) 20 MEQ tablet Take 1 tablet (20 mEq total) by mouth daily. (Patient taking differently: Take 10 mEq by mouth daily.) 30 tablet 0   No  current facility-administered medications for this encounter.    Allergies  Allergen Reactions   Adhesive [Tape] Other (See Comments)    Paper tape only   Allopurinol  Swelling, Hives and Itching   Codeine Nausea Only   Latex Other (See Comments)    Removes skin    ROS- All systems are reviewed and negative except as per the HPI above.  Physical Exam: Vitals:   10/03/24 1134  BP: 106/76  Pulse: 68  Weight: 56.4 kg  Height: 5' 5 (1.651 m)    GEN- The patient is well appearing, alert and oriented x 3 today.   Neck - no JVD or carotid bruit noted  Lungs- Clear to ausculation bilaterally, normal work of breathing Heart- Regular rate and rhythm, no murmurs, rubs or gallops, PMI not laterally displaced Extremities- no clubbing, cyanosis, or edema Skin - no rash or ecchymosis noted   Wt Readings from Last 3 Encounters:  10/03/24 56.4 kg  09/25/24 57.5 kg  09/12/24 58.2 kg    EKG today demonstrates  Vent. rate 68 BPM PR interval 194 ms QRS duration 88 ms QT/QTcB 382/406 ms P-R-T axes 4 81 41 Sinus rhythm with Premature supraventricular complexes Otherwise normal ECG When compared with ECG of 18-Aug-2024 18:10, Nonspecific T wave abnormality no longer evident in Lateral leads  Echo 01/02/22 demonstrated   1. Left ventricular ejection fraction, by estimation, is 60 to 65%. The  left ventricle has normal function. The left ventricle has no regional  wall motion abnormalities. Left ventricular diastolic parameters are  consistent with Grade I diastolic dysfunction (impaired relaxation). Elevated left ventricular end-diastolic pressure.   2. Right ventricular systolic function is normal. The right ventricular  size is normal. There is mildly elevated pulmonary artery systolic  pressure.   3. Left atrial size was severely dilated.   4. The mitral valve is normal in structure. Trivial mitral valve  regurgitation. No evidence of mitral stenosis.   5. The aortic valve is  tricuspid. Aortic valve regurgitation is not  visualized. No aortic stenosis is present.   6. The inferior vena cava is normal in size with greater than 50%  respiratory variability, suggesting right atrial pressure of 3 mmHg.   Epic records are reviewed at length today  CHA2DS2-VASc Score = 6  The patient's score is based upon: CHF History: 0 HTN History: 1 Diabetes History: 0 Stroke History: 2 Vascular Disease History: 0 Age Score: 2 Gender Score: 1       ASSESSMENT AND PLAN: Paroxysmal Atrial Fibrillation (ICD10:  I48.0) The patient's CHA2DS2-VASc score is 6, indicating a 9.7% annual risk of stroke.    Patient is currently in NSR. We discussed at length regarding the risk of recurrent stroke related to Afib. We went over the benefits vs potential adverse effects of anticoagulation. We went over conservative observation with subsequent ILR checks. At patient's request, we will coordinate with pharmacy for price check with Eliquis/Xarelto.  Secondary Hypercoagulable State (ICD10:  D68.69) The patient is at significant risk for stroke/thromboembolism based upon her CHA2DS2-VASc Score of 6.   We discussed the reasoning behind anticoagulation in the setting of stroke prevention related to Afib. We discussed the benefits vs risks of anticoagulation. After discussion, patient would like to begin anticoagulation and understands the potential risks. Will begin Eliquis 5 mg BID and print order for patient to have CBC in 1 month.   Follow up as scheduled with Dr. Ladona. Follow up 1 year Afib clinic.    Dorn Heinrich, [facility] [address] [place], [place] [address] [phone] 10/03/2024 12:01 PM

## 2024-10-04 ENCOUNTER — Telehealth: Payer: Self-pay

## 2024-10-04 LAB — CLOSTRIDIUM DIFFICILE BY PCR: Toxigenic C. Difficile by PCR: NEGATIVE

## 2024-10-04 NOTE — Telephone Encounter (Signed)
 Inbound call from patient stating that she has questions regarding stool test. Please advise.

## 2024-10-04 NOTE — Telephone Encounter (Signed)
 Pt called and requested that I call her back. Unable to reach pt or leave message.

## 2024-10-04 NOTE — Telephone Encounter (Signed)
 Attempted to reach patient. Patient picked up the phone, said hello then no other reply.  Will attempt to reach patient again shortly.

## 2024-10-04 NOTE — Telephone Encounter (Signed)
 Patient calls stating that she saw [facility] yesterday and was not given any synopsis of her visit and wants to get this. I advised that she last saw Tina on 09/25/24 and after visit information from that visit can be found in mychart under visit summary or chart notes. Advised she did see cardiology yesterday and inquired as to if she was instead asking about that appointment. Patient seemed somewhat confused during our conversation. She then says that she dropped of test at the lab with a stool hat. Advised we do show that a stool for C Diff has been sent to an outside lab but we likely will not get these results back for several more days. Advised we will reach out with results as they are available to us . She verbalizes understanding.

## 2024-10-05 ENCOUNTER — Ambulatory Visit: Payer: Self-pay | Admitting: Physician Assistant

## 2024-10-10 ENCOUNTER — Telehealth: Payer: Self-pay | Admitting: Physician Assistant

## 2024-10-10 DIAGNOSIS — R197 Diarrhea, unspecified: Secondary | ICD-10-CM

## 2024-10-10 NOTE — Telephone Encounter (Signed)
 Patient's daughter, Cloretta calls stating that her mother continues to have diarrheal bowel movements multiple times daily and had her 2nd episode of nocturnal fecal incontinence last night; didn't realize she had bowel movement until she awakened from her sleep. No blood noted. Daughter states that patient does hold the left side of her abdomen intermittently like she is hurting but this does not seem specifically in relation to bowel movements or eating. Patient is eating solid foods again following GTube removal. Daughter says patient's stomach gurgles and makes loud sounds with eating. No fever, no nausea or vomiting.   Recent C Diff studies negative; daughter states that she purchased imodium for her mother and patient states she is taking it, however, states there are only 3 pills gone from the bottle that was given to her Saturday, so daughter is unsure that she has actually been taking this with any consistency.  Would like further guidance on what to do.

## 2024-10-10 NOTE — Telephone Encounter (Signed)
 Inbound call from patient's daughter stating patient is still having diarrhea. States during her sleep she had an accident. Patient's daughter is requesting a call back. Please advise, thank you

## 2024-10-11 ENCOUNTER — Other Ambulatory Visit

## 2024-10-11 NOTE — Telephone Encounter (Signed)
 Patient came to pick up stool studies. GI targeted 2 panel ordered.

## 2024-10-11 NOTE — Telephone Encounter (Signed)
 Felicia Washington, regarding GI pathogen panel, please choose which of the following you would prefer. Insurance will not cover full GI pathogen panel from [facility] and will not cover both targeted test from [facility], only one or the other.    I have spoken to [place], patient's daughter to advise of Tina's recommendations. She will come pick up specimen kits from 3rd floor and lab. She is advised patient may take up to 8 imodium daily for diarrhea. Also advised +hydration with electrolyte drinks/water and to call back with any rectal bleeding or worsening abdominal pain. Sherrill verbalizes understanding.

## 2024-10-11 NOTE — Telephone Encounter (Signed)
 Felicia Washington, I already sent the patient home with GI targeted 2 panel as I was unable to get a response while patient was here in the office to pick up kit.

## 2024-10-13 ENCOUNTER — Other Ambulatory Visit: Payer: Self-pay | Admitting: General Surgery

## 2024-10-13 ENCOUNTER — Other Ambulatory Visit

## 2024-10-13 DIAGNOSIS — R410 Disorientation, unspecified: Secondary | ICD-10-CM | POA: Diagnosis not present

## 2024-10-13 DIAGNOSIS — R197 Diarrhea, unspecified: Secondary | ICD-10-CM | POA: Diagnosis not present

## 2024-10-13 DIAGNOSIS — K591 Functional diarrhea: Secondary | ICD-10-CM

## 2024-10-18 ENCOUNTER — Telehealth: Payer: Self-pay | Admitting: Physician Assistant

## 2024-10-18 ENCOUNTER — Other Ambulatory Visit

## 2024-10-18 NOTE — Telephone Encounter (Signed)
 Left message for patient that not all stool studies have returned yet. When they do, we will reach out to her with those results and any recommendations.  Felicia Washington, please see [facility] GI targeted panel results below (fecal calprotectin and pancreatic elastase are still pending)

## 2024-10-18 NOTE — Telephone Encounter (Signed)
 Inbound call from patient stating she brought in he stool sample and was told it would take 2-3 days for results. Patient would like to know if those results are in.  Please advise  Thank you

## 2024-10-18 NOTE — Telephone Encounter (Signed)
 Patient is advised that GI panel has returned negative for infection. However, I advised that we are still awaiting return of 2 additional stool tests and will reach out with those results as they are received. She verbalizes understanding. Continues having diarrheal symptoms as before.

## 2024-10-19 ENCOUNTER — Ambulatory Visit
Admission: RE | Admit: 2024-10-19 | Discharge: 2024-10-19 | Disposition: A | Discharge status: Home / Self Care | Source: Ambulatory Visit | Attending: General Surgery | Admitting: General Surgery

## 2024-10-19 DIAGNOSIS — K8020 Calculus of gallbladder without cholecystitis without obstruction: Secondary | ICD-10-CM | POA: Diagnosis not present

## 2024-10-19 DIAGNOSIS — K591 Functional diarrhea: Secondary | ICD-10-CM

## 2024-10-19 LAB — CALPROTECTIN: Calprotectin: 71 ug/g

## 2024-10-20 ENCOUNTER — Other Ambulatory Visit (HOSPITAL_COMMUNITY): Payer: Self-pay

## 2024-10-20 ENCOUNTER — Telehealth (HOSPITAL_COMMUNITY): Payer: Self-pay | Admitting: Pharmacy Technician

## 2024-10-20 ENCOUNTER — Telehealth: Payer: Self-pay | Admitting: *Deleted

## 2024-10-20 ENCOUNTER — Telehealth: Payer: Self-pay | Admitting: Physician Assistant

## 2024-10-20 ENCOUNTER — Ambulatory Visit: Payer: Self-pay | Admitting: Physician Assistant

## 2024-10-20 DIAGNOSIS — D509 Iron deficiency anemia, unspecified: Secondary | ICD-10-CM

## 2024-10-20 LAB — PANCREATIC ELASTASE, FECAL: Pancreatic Elastase-1, Stool: 346 ug/g (ref >200)

## 2024-10-20 NOTE — Progress Notes (Signed)
 Notify patient stool test shows: 1.  Fecal calprotectin is slightly borderline elevated.  This is not really significant for inflammatory bowel disease. 2.  Fecal pancreatic elastase test is normal.  No evidence of pancreatic insufficiency to cause diarrhea. 3.  If patient is still having diarrhea I recommend: - Colonoscopy with Dr. Abran for further evaluation. [name], PA-C

## 2024-10-20 NOTE — Telephone Encounter (Signed)
 Pharmacy please advise on holding Eliquis prior to colonoscopy scheduled for TBD. (CBC 10/03/2024) (BMT 09/29/2024) Last labs Thank you.

## 2024-10-20 NOTE — Telephone Encounter (Signed)
 Inbound call from patient stating that she received a notification about some lab results and is requesting to speak to [facility] or her nurse about them. Patient is requesting a call back. Please advise.

## 2024-10-20 NOTE — Telephone Encounter (Signed)
 Patient Product/process development scientist completed.    The patient is insured through [employer]. Patient has Medicare and is not eligible for a copay card, but may be able to apply for patient assistance or Medicare RX Payment Plan (Patient Must reach out to their plan, if eligible for payment plan), if available.    Ran test claim for dabigatran 150 mg capsules  and the current 30 day co-pay is $37.80.   This test claim was processed through [facility]- copay amounts may vary at other pharmacies due to pharmacy/plan contracts, or as the patient moves through the different stages of their insurance plan.     Reyes Sharps, CPHT Pharmacy Technician Patient Advocate Specialist Lead [facility] Patient Advocate Team Direct Number: [phone]  Fax: [phone]

## 2024-10-20 NOTE — Telephone Encounter (Signed)
 Request for surgical clearance:     Endoscopy Procedure  What type of surgery is being performed?     colonoscopy  When is this surgery scheduled?     TBD  What type of clearance is required ?   Pharmacy  Are there any medications that need to be held prior to surgery and how long? Eliquis, 2 days  Practice name and name of physician performing surgery?      [facility]  What is your office phone and fax number?      Phone- [phone]  Fax- [phone]  Anesthesia type (None, local, MAC, general) ?       MAC  Please route your response to Naomie Sharps RN  thanks

## 2024-10-22 ENCOUNTER — Ambulatory Visit

## 2024-10-22 DIAGNOSIS — I480 Paroxysmal atrial fibrillation: Secondary | ICD-10-CM

## 2024-10-23 ENCOUNTER — Telehealth: Payer: Self-pay | Admitting: Physician Assistant

## 2024-10-23 LAB — CUP PACEART REMOTE DEVICE CHECK
Date Time Interrogation Session: 20251026020700
Implantable Pulse Generator Implant Date: 20230214
Pulse Gen Serial Number: 172346

## 2024-10-23 NOTE — Telephone Encounter (Signed)
 Waiting on patients portion

## 2024-10-23 NOTE — Telephone Encounter (Signed)
 Inbound call from patient requesting to speak to Felicia Washington about some lab work that was order for her. Patient is requesting a call back to her mobile number. Please advise.

## 2024-10-23 NOTE — Telephone Encounter (Signed)
 See fecal calprotectin 10/13/24  result note for additional information.

## 2024-10-24 NOTE — Telephone Encounter (Signed)
 Update: currently holding off on colonoscopy at this time. Patient was found to have large gallstones and will be undergoing surgical evaluation with probable cholecystectomy.  Will discuss endoscopic procedures if needed following surgical opinion.

## 2024-10-24 NOTE — Progress Notes (Signed)
 Remote Loop Recorder Transmission

## 2024-10-25 ENCOUNTER — Encounter: Payer: Self-pay | Admitting: Physician Assistant

## 2024-10-25 ENCOUNTER — Ambulatory Visit: Payer: Self-pay | Admitting: Cardiology

## 2024-10-25 DIAGNOSIS — I10 Essential (primary) hypertension: Secondary | ICD-10-CM | POA: Diagnosis not present

## 2024-10-25 DIAGNOSIS — I480 Paroxysmal atrial fibrillation: Secondary | ICD-10-CM | POA: Diagnosis not present

## 2024-10-25 DIAGNOSIS — R413 Other amnesia: Secondary | ICD-10-CM | POA: Diagnosis not present

## 2024-10-25 DIAGNOSIS — E782 Mixed hyperlipidemia: Secondary | ICD-10-CM | POA: Diagnosis not present

## 2024-10-25 DIAGNOSIS — Z780 Asymptomatic menopausal state: Secondary | ICD-10-CM | POA: Diagnosis not present

## 2024-10-25 DIAGNOSIS — Z8673 Personal history of transient ischemic attack (TIA), and cerebral infarction without residual deficits: Secondary | ICD-10-CM | POA: Diagnosis not present

## 2024-10-25 DIAGNOSIS — F349 Persistent mood [affective] disorder, unspecified: Secondary | ICD-10-CM | POA: Diagnosis not present

## 2024-10-25 DIAGNOSIS — Z23 Encounter for immunization: Secondary | ICD-10-CM | POA: Diagnosis not present

## 2024-10-25 DIAGNOSIS — F411 Generalized anxiety disorder: Secondary | ICD-10-CM | POA: Diagnosis not present

## 2024-10-26 DIAGNOSIS — M1A9XX1 Chronic gout, unspecified, with tophus (tophi): Secondary | ICD-10-CM | POA: Diagnosis not present

## 2024-10-26 DIAGNOSIS — I7300 Raynaud's syndrome without gangrene: Secondary | ICD-10-CM | POA: Diagnosis not present

## 2024-10-26 DIAGNOSIS — M1991 Primary osteoarthritis, unspecified site: Secondary | ICD-10-CM | POA: Diagnosis not present

## 2024-10-26 DIAGNOSIS — R7689 Other specified abnormal immunological findings in serum: Secondary | ICD-10-CM | POA: Diagnosis not present

## 2024-10-26 DIAGNOSIS — R7989 Other specified abnormal findings of blood chemistry: Secondary | ICD-10-CM | POA: Diagnosis not present

## 2024-10-26 DIAGNOSIS — Z79899 Other long term (current) drug therapy: Secondary | ICD-10-CM | POA: Diagnosis not present

## 2024-10-26 DIAGNOSIS — M2550 Pain in unspecified joint: Secondary | ICD-10-CM | POA: Diagnosis not present

## 2024-10-26 DIAGNOSIS — Z6820 Body mass index (BMI) 20.0-20.9, adult: Secondary | ICD-10-CM | POA: Diagnosis not present

## 2024-10-27 NOTE — Telephone Encounter (Signed)
 Patient states that she looked on mychart and was able to find the time/date/location of her upcoming appointment. No further questions at this time.

## 2024-10-27 NOTE — Telephone Encounter (Signed)
 Inbound call from patient and her daughter. They stat she is not having the gallbladder surgery and can be scheduled for colonoscopy. Patient has been scheduled for 12/5 at 10:30. She is requesting a call back to discuss if she can be seen sooner because she is having issues with diarrhea. Please advise.

## 2024-10-27 NOTE — Telephone Encounter (Signed)
 Felicia Washington.... Patient called stating that general surgery has decided against moving forward with cholecystectomy at this time. Therefore, she has gone forward with scheduling [facility] colonoscopy as previously recommmended by our office. She also has an office follow up with you on 11/21/24 at which time she can be provided prep instructions. However, I do notice that one note indicates endo/colon is needed while another asks for colonoscopy only. Can you please clarify?

## 2024-10-27 NOTE — Telephone Encounter (Signed)
Patient calling in regards to previous note. Please advise.

## 2024-10-29 NOTE — Telephone Encounter (Signed)
 Patient with diagnosis of atrial fibrillation on eliquis for anticoagulation.    What type of surgery is being performed?     colonoscopy   When is this surgery scheduled?     TBD   CHA2DS2-VASc Score = 6   This indicates a 9.7% annual risk of stroke. The patient's score is based upon: CHF History: 0 HTN History: 1 Diabetes History: 0 Stroke History: 2 Vascular Disease History: 0 Age Score: 2 Gender Score: 1   History of cryptogenic stroke 12/2021  CrCl 3 Platelet count 274  Patient has not had an Afib/aflutter ablation in the last 3 months, DCCV within the last 4 weeks or a watchman implanted in the last 45 days   Per office protocol, patient can hold Eliquis for 2 days prior to procedure.   Patient will not need bridging with Lovenox  (enoxaparin ) around procedure.  **This guidance is not considered finalized until pre-operative APP has relayed final recommendations.**

## 2024-10-30 ENCOUNTER — Encounter

## 2024-10-30 NOTE — Telephone Encounter (Signed)
 Primary Cardiologist:Jay Ladona, MD  Chart reviewed as part of pre-operative protocol coverage. Because of Felicia Washington's past medical history and time since last visit, he/she will require a follow-up visit in order to better assess preoperative cardiovascular risk.  Pre-op covering staff: - Patient already has an upcoming appointment on 11/20/24 with Dr. Ladona at which time clearance will be addressed. Appointment notes have been updated.  - Please contact requesting surgeon's office via preferred method (i.e, phone, fax) to inform them of need for appointment prior to surgery.  Per office protocol, patient can hold Eliquis for 2 days prior to procedure and should resume as soon as hemodynamically stable post procedure.  Patient will not need bridging with Lovenox  (enoxaparin ) around procedure.  Felicia EMERSON Bane, NP-C  10/30/2024, 10:39 AM [address], [address] [place], [place] [address] Office [phone] Fax [phone]

## 2024-10-30 NOTE — Telephone Encounter (Signed)
 Will send if patient decides to move forward

## 2024-10-30 NOTE — Addendum Note (Signed)
 Addended by: CLAUDENE NAOMIE SAILOR on: 10/30/2024 09:53 AM   Modules accepted: Orders

## 2024-10-30 NOTE — Telephone Encounter (Signed)
 Patient is scheduled for endo/colon 12/01/24 with Dr Abran. She also has follow up visit with T.Garrett, PA-C on 12/01/24. She should be given prep instructions for 12/5 procedures at that time as well as advised of Eliquis clearance as provided by cardiology prior to her appointment.

## 2024-10-30 NOTE — Telephone Encounter (Signed)
 Per preop APP Rosaline Bane, NP the pt has an appt with Dr. Ladona 11/20/24, clearance to be addressed with DR. Ganji.

## 2024-11-02 ENCOUNTER — Other Ambulatory Visit (HOSPITAL_COMMUNITY): Payer: Self-pay

## 2024-11-02 MED ORDER — APIXABAN 5 MG PO TABS: 5.0000 mg | ORAL_TABLET | Freq: Two times a day (BID) | ORAL | Status: DC

## 2024-11-03 DIAGNOSIS — I480 Paroxysmal atrial fibrillation: Secondary | ICD-10-CM | POA: Diagnosis not present

## 2024-11-04 LAB — CBC
Hematocrit: 36.8 % (ref 34.0–46.6)
Hemoglobin: 12.5 g/dL (ref 11.1–15.9)
MCH: 30.7 pg (ref 26.6–33.0)
MCHC: 34 g/dL (ref 31.5–35.7)
MCV: 90 fL (ref 79–97)
Platelets: 266 x10E3/uL (ref 150–450)
RBC: 4.07 x10E6/uL (ref 3.77–5.28)
RDW: 13.8 % (ref 11.7–15.4)
WBC: 7.6 x10E3/uL (ref 3.4–10.8)

## 2024-11-06 ENCOUNTER — Ambulatory Visit (HOSPITAL_COMMUNITY): Payer: Self-pay | Admitting: Internal Medicine

## 2024-11-08 NOTE — Telephone Encounter (Signed)
 Patients daughter faxed over patients portion and providers office is sending to us  their portion.

## 2024-11-09 DIAGNOSIS — E871 Hypo-osmolality and hyponatremia: Secondary | ICD-10-CM | POA: Diagnosis not present

## 2024-11-10 DIAGNOSIS — E876 Hypokalemia: Secondary | ICD-10-CM | POA: Diagnosis not present

## 2024-11-10 NOTE — Telephone Encounter (Signed)
 Faxed to [facility]- patient portion

## 2024-11-13 DIAGNOSIS — E876 Hypokalemia: Secondary | ICD-10-CM | POA: Diagnosis not present

## 2024-11-15 NOTE — Telephone Encounter (Signed)
 Left message to call back.

## 2024-11-15 NOTE — Telephone Encounter (Signed)
 Patient requesting f/u call in regards to upcoming apt. No further details. Please advise.

## 2024-11-16 NOTE — Telephone Encounter (Signed)
 Patient states she no longer has a question. She was calling to make sure she needed to follow up with Ellouise on 11/21/24 before.

## 2024-11-20 ENCOUNTER — Ambulatory Visit: Admitting: Cardiology

## 2024-11-20 DIAGNOSIS — E876 Hypokalemia: Secondary | ICD-10-CM | POA: Diagnosis not present

## 2024-11-21 ENCOUNTER — Ambulatory Visit (INDEPENDENT_AMBULATORY_CARE_PROVIDER_SITE_OTHER): Admitting: Physician Assistant

## 2024-11-21 ENCOUNTER — Encounter: Payer: Self-pay | Admitting: Physician Assistant

## 2024-11-21 ENCOUNTER — Telehealth: Payer: Self-pay

## 2024-11-21 VITALS — BP 116/68 | HR 68 | Ht 64.0 in | Wt 126.0 lb

## 2024-11-21 DIAGNOSIS — K8020 Calculus of gallbladder without cholecystitis without obstruction: Secondary | ICD-10-CM | POA: Diagnosis not present

## 2024-11-21 DIAGNOSIS — Z8719 Personal history of other diseases of the digestive system: Secondary | ICD-10-CM

## 2024-11-21 DIAGNOSIS — Z860100 Personal history of colon polyps, unspecified: Secondary | ICD-10-CM | POA: Diagnosis not present

## 2024-11-21 DIAGNOSIS — D509 Iron deficiency anemia, unspecified: Secondary | ICD-10-CM | POA: Diagnosis not present

## 2024-11-21 DIAGNOSIS — Z9889 Other specified postprocedural states: Secondary | ICD-10-CM

## 2024-11-21 DIAGNOSIS — K900 Celiac disease: Secondary | ICD-10-CM | POA: Diagnosis not present

## 2024-11-21 DIAGNOSIS — R197 Diarrhea, unspecified: Secondary | ICD-10-CM | POA: Diagnosis not present

## 2024-11-21 MED ORDER — NA SULFATE-K SULFATE-MG SULF 17.5-3.13-1.6 GM/177ML PO SOLN: 1.0000 | Freq: Once | ORAL | 0 refills | Status: AC

## 2024-11-21 NOTE — Progress Notes (Signed)
 Felicia Console, PA-C [address] [place], [place]  [address] Phone: [phone]   Primary Care Physician: Nichole Senior, MD  Primary Gastroenterologist:  Felicia Console, PA-C / Norleen Kiang, MD   Chief Complaint: Follow-up diarrhea, anemia, hernia repair, gallstones, celiac       HPI:   Discussed the use of AI scribe software for clinical note transcription with the patient, who gave verbal consent to proceed.  She is here today with her daughter who helps with her care.  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  She followed up with Dr. Patrici 09/21/2024 NG tube was removed.  GI symptoms were improving 2 months ago.  She was having some intermittent diarrhea.  She adheres to a gluten-free diet.  She saw her surgeon Dr. Patrici 10/27/2024 for follow-up.  Was thought to have functional diarrhea.  09/2024 stool tests: C. difficile PCR stool test negative.  Fecal pancreatic elastase (346).  Fecal calprotectin normal.  GI panel negative for adenovirus, norovirus, rotavirus, Cryptosporidium, and Giardia.  10/19/2024 RUQ ultrasound: Moderate cholelithiasis.  Normal gallbladder wall.  CBD 6 mm.  She was referred to general surgeon to discuss cholecystectomy.  Mild increased liver echogenicity.  Patient had appointment with cardiologist Dr. Ladona 11/20/2024 to discuss cardiac clearance for EGD and colonoscopy, however she missed her appointment.  She was given permission to hold Eliquis  2 days prior to procedures.  However, cardiac clearance for procedures was not approved until she has follow-up cardiology appointment.  History of Present Illness She is scheduled for a colonoscopy and upper endoscopy to evaluate iron deficiency anemia on December 5th with Dr. Kiang. She has a history of atrial fibrillation and is currently on Eliquis ,  which was initiated by the AFib clinic.  Her celiac disease is well-controlled, with recent blood work showing normal celiac labs. She uses Metamucil to manage her symptoms.  She was started on cholestyramine powder 1 packet twice daily by Dr. Patrici and her diarrhea is well-controlled.  She has not had any diarrhea since starting Metamucil and cholestyramine.  She has gallstones and has consulted with Dr. Arnulfo, who did not recommend gallbladder surgery unless symptoms worsen. Her family history includes gallstones in her mother and two sisters.  She has experienced iron deficiency anemia in the past, with her hemoglobin levels now improved to normal on oral iron. She is currently taking cholestyramine twice a day, which has helped with diarrhea.  No recent chest pain, shortness of breath, abdominal pain, diarrhea, constipation, or blood in stool. She previously experienced nausea and vomiting post-surgery, which has resolved.   02/2016 last colonoscopy by Dr. Kiang: Pandiverticulosis, otherwise normal.  No polyps.  Excellent prep.  10-year repeat (due 02/2026).   2008 colonoscopy: Diverticulosis, 2 small tubular adenoma polyps removed.   2008 EGD mild esophagitis.  Normal duodenum.  Hiatal hernia.  Continued on Nexium and gluten-free diet.  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   PMH: Hx atrial fibrillation, CVA (2023), HTN, celiac disease, GERD with esophagitis, adenomatous colon polyps, diverticulosis, hypothyroidism, CKD, LVEF 60 to 65%.  08/2024: Celiac labs normal.  Very low potassium 2.8.  Normal B12 and folate.  Low iron.  Mild low sodium and chloride.  She was started on potassium chloride  20 mEq daily.  She was started on OTC ferrous sulfate 325 mg once daily.  She followed up  with her PCP.  Labs 11/16/2024 showed normal potassium 4.3.  Lab 11/03/2024 showed normal hemoglobin 12.5.  Component Ref Range & Units (hover) 2 wk ago (11/03/24) 1 mo ago (09/25/24) 3  mo ago (08/23/24) 3 mo ago (08/22/24) 3 mo ago (08/21/24) 3 mo ago (08/20/24) 3 mo ago (08/19/24)  WBC 7.6 6.7 R 7.2 R 10.1 R 8.5 R 11.5 High  R 12.6 High  R  RBC 4.07 3.81 Low  R 2.95 Low  R 3.30 Low  R 3.42 Low  R 3.29 Low  R 3.82 Low  R  Hemoglobin 12.5 11.3 Low  R 9.1 Low  R 10.0 Low  R 10.2 Low  R 10.0 Low  R 11.6 Low  R  Hematocrit 36.8 33.1 Low  R 26.8 Low  R 29.6 Low  R 31.2 Low  R      Current Outpatient Medications  Medication Sig Dispense Refill   acetaminophen  (TYLENOL ) 500 MG tablet Take 1,000 mg by mouth every 6 (six) hours as needed for mild pain (pain score 1-3).     albuterol  (VENTOLIN  HFA) 108 (90 Base) MCG/ACT inhaler Inhale 2 puffs into the lungs every 8 (eight) hours as needed for shortness of breath or wheezing.     amLODipine  (NORVASC ) 10 MG tablet Take 1 tablet (10 mg total) by mouth every morning. 90 tablet 3   apixaban  (ELIQUIS ) 5 MG TABS tablet Take 1 tablet (5 mg total) by mouth 2 (two) times daily. 28 tablet    atorvastatin  (LIPITOR) 40 MG tablet Take 40 mg by mouth in the morning.     buPROPion  (WELLBUTRIN  XL) 150 MG 24 hr tablet Take 150 mg by mouth in the morning. (Patient taking differently: Take 75 mg by mouth in the morning.)     chlorthalidone  (HYGROTON ) 25 MG tablet Take 25 mg by mouth every morning.     clonazePAM (KLONOPIN) 0.5 MG tablet Take 0.5 mg by mouth at bedtime.     famotidine  (PEPCID ) 20 MG tablet Take 20 mg by mouth at bedtime.     Febuxostat  80 MG TABS Take 80 mg by mouth at bedtime.     fenofibrate  micronized (LOFIBRA) 67 MG capsule Take 67 mg by mouth in the morning.  5   levothyroxine  (SYNTHROID , LEVOTHROID) 112 MCG tablet Take 56-224 mcg by mouth See admin instructions. Take 1 tablets (112 mcg) by mouth on Mondays through Saturdays before breakfast. Take 2tablest (224 mcg) by mouth on Sundays before breakfast.     metoprolol  succinate (TOPROL  XL) 50 MG 24 hr tablet Take 1 tablet (50 mg total) by mouth at bedtime. 90 tablet 3   montelukast   (SINGULAIR ) 10 MG tablet Take 10 mg by mouth at bedtime.     Na Sulfate-K Sulfate-Mg Sulfate concentrate (SUPREP) 17.5-3.13-1.6 GM/177ML SOLN Take 1 kit (354 mLs total) by mouth once for 1 dose. 354 mL 0   olmesartan  (BENICAR ) 40 MG tablet Take 1 tablet (40 mg total) by mouth every evening. 90 tablet 3   oxyCODONE  (OXY IR/ROXICODONE ) 5 MG immediate release tablet Take 1 tablet (5 mg total) by mouth every 4 (four) hours as needed for moderate pain (pain score 4-6). 20 tablet 0   potassium chloride  SA (KLOR-CON  M) 20 MEQ tablet Take 1 tablet (20 mEq total) by mouth daily. (Patient taking differently: Take 10 mEq by mouth daily.) 30 tablet 0   No current facility-administered medications for this visit.    Allergies as of 11/21/2024 - Review Complete 11/21/2024  Allergen  Reaction Noted   Adhesive [tape] Other (See Comments) 08/16/2018   Allopurinol  Swelling, Hives, and Itching 09/27/2019   Codeine Nausea Only 01/03/2009   Latex Other (See Comments) 09/27/2019    Past Medical History:  Diagnosis Date   Anemia    Arthritis    fingers, shoulders (09/01/2018)   Asthma    Celiac disease    Gallstones 09/21/2024   GERD (gastroesophageal reflux disease)    Graves disease    Heart murmur    Hiatal hernia    History of blood transfusion    related to c-section   History of gout    fingers   HLD (hyperlipidemia)    HTN (hypertension)    Hypothyroidism    Migraine    1 q couple years (09/01/2018)   Pneumonia    several times in 1 yr (09/01/2018)   PONV (postoperative nausea and vomiting)    Seasonal allergies    Stroke [facility])    jan 2023    Past Surgical History:  Procedure Laterality Date   BUBBLE STUDY  09/23/2022   Procedure: BUBBLE STUDY;  Surgeon: Michele Richardson, DO;  Location: [facility] ENDOSCOPY;  Service: Cardiovascular;;   CESAREAN SECTION  1975   COLONOSCOPY     HIATAL HERNIA REPAIR N/A 08/21/2024   Procedure: REPAIR, HERNIA, HIATAL, LAPAROSCOPIC;  Surgeon: Kinsinger, Herlene Righter, MD;  Location: [facility] OR;  Service: General;  Laterality: N/A;   LAPAROSCOPIC GASTROSTOMY N/A 08/21/2024   Procedure: CREATION, GASTROSTOMY, LAPAROSCOPIC;  Surgeon: Stevie Herlene Righter, MD;  Location: [facility] OR;  Service: General;  Laterality: N/A;   POLYPECTOMY     REVERSE SHOULDER ARTHROPLASTY Right 09/01/2018   REVERSE SHOULDER ARTHROPLASTY Right 09/01/2018   Procedure: RIGHT REVERSE SHOULDER ARTHROPLASTY;  Surgeon: Melita Drivers, MD;  Location: [facility] OR;  Service: Orthopedics;  Laterality: Right;   SHOULDER ARTHROSCOPY W/ ROTATOR CUFF REPAIR Bilateral    TEE WITHOUT CARDIOVERSION N/A 09/23/2022   Procedure: TRANSESOPHAGEAL ECHOCARDIOGRAM (TEE);  Surgeon: Michele Richardson, DO;  Location: [facility] ENDOSCOPY;  Service: Cardiovascular;  Laterality: N/A;   THYROIDECTOMY     TONSILLECTOMY      Review of Systems:    All systems reviewed and negative except where noted in HPI.    Physical Exam:  BP 116/68   Pulse 68   Ht 5' 4 (1.626 m)   Wt 126 lb (57.2 kg)   BMI 21.63 kg/m  No LMP recorded. Patient is postmenopausal.  General: Well-nourished, well-developed in no acute distress.  Lungs: Clear to auscultation bilaterally. Non-labored. Heart: Regular rate and rhythm, no murmurs rubs or gallops.  Abdomen: Bowel sounds are normal; Abdomen is Soft; No hepatosplenomegaly, masses or hernias;  No Abdominal Tenderness; No guarding or rebound tenderness. Neuro: Alert and oriented x 3.  Grossly intact.  Psych: Alert and cooperative, normal mood and affect.   Imaging Studies: No results found.  Labs: CBC    Component Value Date/Time   WBC 7.6 11/03/2024 0905   WBC 6.7 09/25/2024 1417   RBC 4.07 11/03/2024 0905   RBC 3.81 (L) 09/25/2024 1417   HGB 12.5 11/03/2024 0905   HCT 36.8 11/03/2024 0905   PLT 266 11/03/2024 0905   MCV 90 11/03/2024 0905   MCH 30.7 11/03/2024 0905   MCH 30.8 08/23/2024 0219   MCHC 34.0 11/03/2024 0905   MCHC 34.2 09/25/2024 1417   RDW 13.8 11/03/2024 0905   LYMPHSABS  1.6 09/25/2024 1417   MONOABS 0.5 09/25/2024 1417   EOSABS 0.1 09/25/2024 1417   BASOSABS  0.0 09/25/2024 1417    CMP     Component Value Date/Time   NA 138 09/29/2024 0924   NA 133 (L) 11/09/2022 1006   K 4.0 09/29/2024 0924   CL 104 09/29/2024 0924   CO2 22 09/29/2024 0924   GLUCOSE 239 (H) 09/29/2024 0924   BUN 13 09/29/2024 0924   BUN 40 (H) 11/09/2022 1006   CREATININE 0.69 09/29/2024 0924   CALCIUM  9.2 09/29/2024 0924   CALCIUM  9.4 08/20/2024 0548   PROT 4.9 (L) 08/23/2024 0219   ALBUMIN  2.7 (L) 08/23/2024 0219   AST 28 08/23/2024 0219   ALT 27 08/23/2024 0219   ALKPHOS 42 08/23/2024 0219   BILITOT 0.9 08/23/2024 0219   GFRNONAA >60 08/23/2024 0219   GFRAA 53 (L) 08/23/2018 1147       Assessment and Plan:   BREE HEINZELMAN is a 78 y.o. y/o female returns for follow-up of:  1.  Iron deficiency anemia; history of A-fib currently on Eliquis . - Lab 11/03/2024 showed improved normal hemoglobin 12.5. - Schedule EGD and colonoscopy - Cardiac clearance from Dr. Ladona - Hold Eliquis  2 days prior to procedures - We canceled EGD and colonoscopy scheduled 12/01/2024 with Dr. Abran.  Postponing procedures (reschedule to 01/02/2025) until after she has follow-up with her cardiologist and receives cardiac clearance.  3.  Chronic intermittent diarrhea: Negative C. difficile, normal fecal pancreatic elastase, normal fecal calprotectin.  No evidence of EPI or IBD.  Celiac labs within normal range and she adheres to strict gluten-free diet due to history of celiac.  Celiac appears to be under good control based on lab results.  Diarrhea is currently controlled on Metamucil and cholestyramine. - Continue Metamucil daily - Continue cholestyramine 1 packet twice daily - She is advised to hold Metamucil and cholestyramine 1 week prior to colonoscopy procedure to ensure adequate prep.  4.  Status post large hiatal hernia repair: Healed.  5.  History of adenomatous colon polyps  6.   Cholelithiasis currently asymptomatic.  Surgery not advised at this time. - Follow-up with general surgeon if she has upper GI symptoms such as RUQ pain, nausea  2.  Hypokalemia: Resolved. - She followed up with her PCP.  Labs 11/16/2024 showed normal potassium 4.3. - Continue follow-up with PCP to monitor in the future.   Felicia Console, PA-C  Follow up based on procedure results and GI symptoms.

## 2024-11-21 NOTE — Telephone Encounter (Signed)
 [facility] Pre-operative Risk Assessment     Request for surgical clearance:     Endoscopy Procedure  What type of surgery is being performed?     Colonoscopy and Endoscopy   When is this surgery scheduled?     01/02/2025  What type of clearance is required ?   Pharmacy  Are there any medications that need to be held prior to surgery and how long? Eliquis  2 days   Practice name and name of physician performing surgery?       Gastroenterology  What is your office phone and fax number?      Phone- [phone]  Fax- [phone]  Anesthesia type (None, local, MAC, general) ?       MAC   Please route your response to Alethea Blocker, CMA

## 2024-11-21 NOTE — Patient Instructions (Signed)
 You have been scheduled for an Endoscopy and Colonoscopy. Please follow the written instructions given to you at your visit today.  If you use inhalers (even only as needed), please bring them with you on the day of your procedure.  DO NOT TAKE 7 DAYS PRIOR TO TEST- Trulicity (dulaglutide) Ozempic, Wegovy (semaglutide) Mounjaro (tirzepatide) Bydureon Bcise (exanatide extended release)  DO NOT TAKE 1 DAY PRIOR TO YOUR TEST Rybelsus (semaglutide) Adlyxin (lixisenatide) Victoza (liraglutide) Byetta (exanatide) ___________________________________________________________________________  Please follow up sooner if symptoms increase or worsen __________________________________________________________________________  Due to recent changes in healthcare laws, you may see the results of your imaging and laboratory studies on MyChart before your provider has had a chance to review them.  We understand that in some cases there may be results that are confusing or concerning to you. Not all laboratory results come back in the same time frame and the provider may be waiting for multiple results in order to interpret others.  Please give us  48 hours in order for your provider to thoroughly review all the results before contacting the office for clarification of your results.   Thank you for trusting me with your gastrointestinal care!   [name], PA-C _______________________________________________________  If your blood pressure at your visit was 140/90 or greater, please contact your primary care physician to follow up on this.  _______________________________________________________  If you are age 65 or older, your body mass index should be between 23-30. Your Body mass index is 21.63 kg/m. If this is out of the aforementioned range listed, please consider follow up with your Primary Care Provider.  If you are age 7 or younger, your body mass index should be between 19-25. Your Body  mass index is 21.63 kg/m. If this is out of the aformentioned range listed, please consider follow up with your Primary Care Provider.   ________________________________________________________  The [facility] providers would like to encourage you to use MYCHART to communicate with providers for non-urgent requests or questions.  Due to long hold times on the telephone, sending your provider a message by [facility] may be a faster and more efficient way to get a response.  Please allow 48 business hours for a response.  Please remember that this is for non-urgent requests.  _______________________________________________________

## 2024-11-22 ENCOUNTER — Ambulatory Visit

## 2024-11-22 DIAGNOSIS — I480 Paroxysmal atrial fibrillation: Secondary | ICD-10-CM

## 2024-11-22 NOTE — Telephone Encounter (Signed)
   Patient Name: JENDAYA GOSSETT  DOB: [birth year] MRN: [id number]  Primary Cardiologist: Gordy Bergamo, MD  Clinical pharmacists have reviewed the patient's past medical history, labs, and current medications as part of preoperative protocol coverage. The following recommendations have been made:  Patient with diagnosis of Afib  on Eliquis  for anticoagulation.     Procedure: Colonoscopy and Endoscopy  Date of procedure: 01/02/2025    CHA2DS2-VASc Score = 7   This indicates a 11.2% annual risk of stroke. The patient's score is based upon: CHF History: 1 HTN History: 1 Diabetes History: 0 Stroke History: 2 Vascular Disease History: 0 Age Score: 2 Gender Score: 1   CrCl 63 mL/min Platelet count 274 K    Patient has not had an Afib/aflutter ablation in the last 3 months, DCCV within the last 4 weeks or a watchman implanted in the last 45 days    Per office protocol, patient can hold Eliquis  for 2 days prior to procedure.  Please resume as soon as safe to do so from a bleeding standpoint. Patient will not need bridging with Lovenox  (enoxaparin ) around procedure.     I will route this recommendation to the requesting party via Epic fax function and remove from pre-op pool.  Please call with questions.  Andersyn Fragoso D Orlander [place], NP 11/22/2024, 10:41 AM

## 2024-11-22 NOTE — Progress Notes (Signed)
 Noted

## 2024-11-22 NOTE — Telephone Encounter (Signed)
 Patient with diagnosis of Afib  on Eliquis  for anticoagulation.    Procedure: Colonoscopy and Endoscopy  Date of procedure: 01/02/2025    CHA2DS2-VASc Score = 7   This indicates a 11.2% annual risk of stroke. The patient's score is based upon: CHF History: 1 HTN History: 1 Diabetes History: 0 Stroke History: 2 Vascular Disease History: 0 Age Score: 2 Gender Score: 1      CrCl 63 mL/min Platelet count 274 K   Patient has not had an Afib/aflutter ablation in the last 3 months, DCCV within the last 4 weeks or a watchman implanted in the last 45 days     Per office protocol, patient can hold Eliquis  for 2 days prior to procedure.   Patient will not need bridging with Lovenox  (enoxaparin ) around procedure.  **This guidance is not considered finalized until pre-operative APP has relayed final recommendations.**

## 2024-11-24 LAB — CUP PACEART REMOTE DEVICE CHECK
Date Time Interrogation Session: 20251126011300
Implantable Pulse Generator Implant Date: 20230214
Pulse Gen Serial Number: 172346

## 2024-11-27 ENCOUNTER — Ambulatory Visit: Payer: Self-pay | Admitting: Cardiology

## 2024-11-28 NOTE — Progress Notes (Signed)
 Remote Loop Recorder Transmission

## 2024-11-29 ENCOUNTER — Other Ambulatory Visit (HOSPITAL_COMMUNITY): Payer: Self-pay

## 2024-11-29 DIAGNOSIS — Z0000 Encounter for general adult medical examination without abnormal findings: Secondary | ICD-10-CM | POA: Diagnosis not present

## 2024-11-29 DIAGNOSIS — E039 Hypothyroidism, unspecified: Secondary | ICD-10-CM | POA: Diagnosis not present

## 2024-11-29 DIAGNOSIS — E871 Hypo-osmolality and hyponatremia: Secondary | ICD-10-CM | POA: Diagnosis not present

## 2024-11-29 DIAGNOSIS — R413 Other amnesia: Secondary | ICD-10-CM | POA: Diagnosis not present

## 2024-11-29 DIAGNOSIS — E782 Mixed hyperlipidemia: Secondary | ICD-10-CM | POA: Diagnosis not present

## 2024-11-29 DIAGNOSIS — Z8673 Personal history of transient ischemic attack (TIA), and cerebral infarction without residual deficits: Secondary | ICD-10-CM | POA: Diagnosis not present

## 2024-11-29 DIAGNOSIS — F411 Generalized anxiety disorder: Secondary | ICD-10-CM | POA: Diagnosis not present

## 2024-11-29 DIAGNOSIS — I10 Essential (primary) hypertension: Secondary | ICD-10-CM | POA: Diagnosis not present

## 2024-11-29 DIAGNOSIS — I480 Paroxysmal atrial fibrillation: Secondary | ICD-10-CM | POA: Diagnosis not present

## 2024-11-29 NOTE — Progress Notes (Signed)
 Medicare AWV  Felicia Washington is a 78 y.o. female who presents for her subsequent annual wellness visit for Medicare.  Clinical documentation was reviewed and is accessible via encounter-level attachments.    Any physical exam components or additional concerns beyond the scope of the Annual Wellness Visit may be documented in a separate note within this encounter.  Medicare Required Components     Reviewed and updated this visit by provider: Tobacco  Allergies  Meds  Problems  Med Hx  Surg Hx  Fam Hx       Medicare required assessment: Future risk of substance use disorder / overdose risk of 26% is higher (>=20%).    Patient Care Team: Hoy LITTIE Miu, PA-C as PCP - General (Family Medicine)  Vitals   Vitals:   11/29/24 0838  BP: 124/62  Patient Position: Sitting  Pulse: 61  Temp: 97.1 F (36.2 C)  TempSrc: Skin  Resp: 18  Height: 5' 5 (1.651 m)  Weight: 127 lb 12.8 oz (58 kg)  SpO2: 98%  BMI (Calculated): 21.3  PainSc: 0-No pain   Physical Exam Vitals and nursing note reviewed.  Constitutional:      Appearance: Normal appearance.  HENT:     Head: Normocephalic and atraumatic.     Right Ear: External ear normal.     Left Ear: External ear normal.     Ears:     Comments: Hearing aids in B/L    Mouth/Throat:     Mouth: Mucous membranes are moist.     Pharynx: Oropharynx is clear.  Eyes:     Extraocular Movements: Extraocular movements intact.     Conjunctiva/sclera: Conjunctivae normal.  Cardiovascular:     Rate and Rhythm: Normal rate and regular rhythm.     Pulses: Normal pulses.     Heart sounds: Normal heart sounds.  Pulmonary:     Effort: Pulmonary effort is normal.     Breath sounds: Normal breath sounds.  Abdominal:     General: Abdomen is flat. Bowel sounds are normal.     Palpations: Abdomen is soft.  Musculoskeletal:        General: Normal range of motion.     Cervical back: Normal range of motion and neck supple.  Skin:     General: Skin is warm and dry.  Neurological:     General: No focal deficit present.     Mental Status: She is alert and oriented to person, place, and time.  Psychiatric:        Mood and Affect: Mood normal.        Behavior: Behavior normal.     Disposition   1. Medicare annual wellness visit, subsequent (Primary) 2. Primary hypertension -     CMP14+CBC/D/Plt+TSH; Future 3. History of CVA (cerebrovascular accident) 4. Paroxysmal A-fib (*) -     CMP14+CBC/D/Plt+TSH; Future 5. Mixed hyperlipidemia -     Lipid panel; Future 6. Hypomagnesemia -     Magnesium ; Future -     Phosphorus; Future 7. Hyponatremia 8. GAD (generalized anxiety disorder) 9. Hypothyroidism, unspecified type 10. Memory loss   Follow up in 6 months (on 05/30/2025).    Health maintenance issues were discussed with the patient.  A written plan was provided to the patient in the form of patient instructions in the After Visit Summary document.  2-5.  BP 124/62 today Discontinued Chlorthalidone  as instructed due to hyponatremia and electrolyte abnormalities Continue medications as prescribed. Cont follow up with Cardiology. Has upcoming apt 01/2025.  Labs today: see orders.   6-7. Continue magnesium  glycinate 400 mg Will recheck labs today  8. Patient and daughter report mood has been very good recently.  Continue Lexapro as prescribed.  Patient has been on 0.25 mg Klonopin x 1 month. Denies withdrawal symptoms.  Denies issues with sleep.  Will discontinue Klonopin starting today. If patient experiences withdrawal symptoms, may take 0.25 mg Klonopin every other day for one month then d/c.  9. Denies thyroid  symptoms Labs today: see orders.  Continue medications as prescribed.  10. Referral placed. Daughter states she was waiting to schedule once pt was off Klonopin.  Follow up in 6 months   Previous office visits reviewed.  Previous labs reviewed.  Risks, benefits, and alternatives of the  medications and treatment plan prescribed today were discussed, and patient expressed understanding. Plan follow-up as discussed or as needed if any worsening symptoms or change in condition.

## 2024-11-30 NOTE — Telephone Encounter (Signed)
 Called patient's daughter to review her lab results.  Suspect that the continued hyponatremia despite stopping chlorthalidone  is related to the newly prescribed Lexapro.  She was advised to stop the Lexapro and switch to BuSpar 5 mg twice daily.  We will repeat her CMP next week.  Patient is asymptomatic with no neurological symptoms.  Regarding her TSH, she is currently taking 112 mcg Monday through Saturday and then 224 on Sunday.  Asked patient's daughter to review with her how she is taking the levothyroxine  given her TSH is 29.  She will message me with what her mother says.

## 2024-12-01 ENCOUNTER — Encounter: Admitting: Internal Medicine

## 2024-12-01 NOTE — Progress Notes (Signed)
 Medication Management with Clinical Pharmacist Practitioner [facility] [address] [place] [address] Dept: [phone] Dept Fax: [phone]   Patient has been advised as to the limitations and limited nature of physical exam due to nature of a telephone visit, the possibility of privacy risk in the use of a telephone visit, and that the healthcare provider may recommend visiting a healthcare clinic for in-person care and follow up. I have spent the following amount of time in direct patient care for this encounter and in coordination of care: New Patient - 20 minutes    Referral to CPP Medication Management placed on 10/25/24 for DOAC price by Chinita Hoy CROME, PA-C  Date of last OV:  11/29/24 with PCP Visit was conducted by (telephone/video visit): Telephone   Felicia Washington is a 78 y.o. female who presents today for initial visit for Atrial Fibrillation    Patient has been advised as to the limitations and limited nature of physical exam due to nature of a telephone visit, the possibility of privacy risk in the use of a telephone visit, and that the healthcare provider may recommend visiting a healthcare clinic for in-person care and follow up.  Assessment/ Plan  Assessment 1. Primary hypertension  Ambulatory Referral to CPP Medication Management    2. Paroxysmal A-fib (*)  Ambulatory Referral to CPP Medication Management    3. Mixed hyperlipidemia  Ambulatory Referral to CPP Medication Management    4. History of CVA (cerebrovascular accident)  Ambulatory Referral to CPP Medication Management      Chronic anticoagulation appropriate with CHADS2VASC = 6 (age, sex, HTN, CVA hx) .Does not meet criteria for reduced dose of Eliquis  (<60 kg, Scr <1.5, age <80 years).  Plan Continue Chronic use of anticoagulant: Eliquis  (apixaban ) 5 mg BID Counseling provided on the importance of adherence, handling of missed DOAC doses, proper administration,  avoidance of OTC NSAIDs and ASA (unless prescribed), minimizing EtOH intake, and self-monitoring. Pt was given opportunity to ask medication-related questions and was able to demonstrate understanding of topics discussed. Next follow up with CPP: 6 months Future Appointments  Date Time Provider Department Center  12/07/2024  1:00 PM [facility] None  05/30/2025 10:00 AM Hoy CROME Chinita, PA-C [facility] None    Length of encounter with pharmacist: 20 min.   Greater than 50% of the visit was spent on counseling or coordinating care regarding the following disease states:  Afib  Subjective:  Today, the patient states she has been doing well. Been on Eliquis  for about 2 months - samples given by PCP and cardiologist due to cost. Was told it'd be $400 at [facility]. Denies any adverse effects.  Indication for anticoagulation:  1. Primary hypertension   2. Paroxysmal A-fib (*)   3. Mixed hyperlipidemia   4. History of CVA (cerebrovascular accident)     Anticoagulant and dosing regimen: Eliquis  (apixaban ) 5 mg BID  Health status: Any new medical problems/ED visit/procedures since last cardiology visit? No   Any known upcoming medical procedures and/or surgeries? Yes   Colonoscopy, will stop several days prior as instructed by GI Any new embolic events? no  Any history of GI/colon resection or removal? no   Adherence: Any skipped/missed doses of medications? no  Is patient taking medication properly (ie rivaroxaban with food; apixaban  every 12 hours, etc) Yes  Is it difficult to pay for medications? Yes >$400  Bleeding and risk factors for bleeding: Has patient had CBC and CMP  in last 6 months? Yes  outside labs 11/03/24 Any significant change in hemoglobin? No   Any concerning bleeding episodes? No   Has patient experienced a recent fall? No    Patient agreeable to transition to [facility] for quality, safety, adherence, and cost savings measures.  Will  involve pharmacy team to assist with transition. Discussed anticipated cost of medication through [facility] savings program. Patient was made aware of their right to have prescriptions sent to a pharmacy of their choosing that best meets their needs.  Objective:   Drug interaction screening: Concomitant antiplatelet theray : N/A Taking oral NSAID medications? No   Drug interactions present? no   Renal function: Does current dose require adjustment for renal function? No CrCl Using Actual Weight Input 40.2 mL/min = 0.85 (female) x (140 - 78 yrs) x 58 kg / (72 x 1.09 mg/dL)  Creatinine  Date Value Ref Range Status  11/29/2024 1.09 (H) 0.57 - 1.00 mg/dL Final  88/75/7974 8.98 (H) 0.57 - 1.00 mg/dL Final  88/82/7974 8.89 (H) 0.57 - 1.00 mg/dL Final     Lab Results  Component Value Date   eGFR 53 (L) 11/29/2024   ALT (SGPT) 15 11/29/2024   AST 29 11/29/2024   Alkaline Phosphatase 47 (L) 11/29/2024   Hemoglobin 11.0 (L) 11/29/2024   Hematocrit 33.9 (L) 11/29/2024   Platelet Count 237 11/29/2024   Agent-specific administration:  Rivaroxaban should be taken with food for optimal absorption; dabigatran capsules should not be opened, crushed or chewed), and timing of apixaban  twice-daily dosing should be taken every 12 hours.   Missed dose- For once daily DOAC, may take missed dose if administered at least 12 hours before next dose is due. For twice daily DOAC, may take missed dose if administered at least 6 hours before next dose is due  Education/Counseling   Patient received instruction on the following topics: Home Delivery Pharmacy's goal is to get your prescription to our door within 7 days. They work with your insurance and us  to get you the best price possible. They will be reaching out to review delivery and payment medication. You should expect a call from [phone] Indication for anticoagulation Importance of adherence to medication  regimen Signs and symptoms of bleeding for which to monitor Signs and symptoms of blood clot for which to monitor When to seek immediate medical attention Importance of notifying your providers and dentists about all the medications you are taking including those that do not require a prescription (I.e. aspirin , ibuprofen, NSAIDs, supplements) If DOAC, pertinent agent specific administration details discussed  Patient's questions were addressed by pharmacist. Patient expressed understanding of information provided and was encouraged to contact pharmacist with questions/ future needs.  Medical Decision Making    Reviewed and updated this visit by provider: Allergies      Documentation for time-based billing:  Total time spent of date of service was 20 minutes.  Patient care activities included preparing to see the patient such as reviewing the patient record, obtaining and/or reviewing separately obtained history, counseling and educating the patient, family, and/or caregiver, ordering prescription medications, tests, or procedures, and documenting clinical information in the electronic or other health record.   Risks, benefits, and alternatives of the medications and treatment plan prescribed today were discussed, and patient expressed understanding. Plan follow-up as discussed or as needed if any worsening symptoms or change in condition.  Portions of this note was dictated using voice recognition software. I  have carefully reviewed this note and despite this, minor syntax, contextual, or spelling errors may be present. Despite my review, errors may be inadvertently overlooked and are related to this software and are not intentional. Please reach out if you have any questions or clarification is needed.  Jon Levan, PharmD, BCGP, CPP Clinical Pharmacist

## 2024-12-07 ENCOUNTER — Other Ambulatory Visit: Payer: Self-pay

## 2024-12-07 ENCOUNTER — Emergency Department (HOSPITAL_BASED_OUTPATIENT_CLINIC_OR_DEPARTMENT_OTHER)

## 2024-12-07 ENCOUNTER — Inpatient Hospital Stay (HOSPITAL_BASED_OUTPATIENT_CLINIC_OR_DEPARTMENT_OTHER)
Admission: EM | Admit: 2024-12-07 | Discharge: 2024-12-09 | DRG: 641 | Disposition: A | Attending: Internal Medicine | Admitting: Internal Medicine

## 2024-12-07 DIAGNOSIS — R410 Disorientation, unspecified: Secondary | ICD-10-CM | POA: Diagnosis not present

## 2024-12-07 DIAGNOSIS — E871 Hypo-osmolality and hyponatremia: Secondary | ICD-10-CM | POA: Diagnosis not present

## 2024-12-07 LAB — BASIC METABOLIC PANEL WITH GFR
Anion gap: 13 (ref 5–15)
BUN: 25 mg/dL — ABNORMAL HIGH (ref 8–23)
CO2: 21 mmol/L — ABNORMAL LOW (ref 22–32)
Calcium: 10 mg/dL (ref 8.9–10.3)
Chloride: 93 mmol/L — ABNORMAL LOW (ref 98–111)
Creatinine, Ser: 0.94 mg/dL (ref 0.44–1.00)
GFR, Estimated: >60 mL/min (ref >60)
Glucose, Bld: 92 mg/dL (ref 70–99)
Potassium: 4 mmol/L (ref 3.5–5.1)
Sodium: 126 mmol/L — ABNORMAL LOW (ref 135–145)

## 2024-12-07 LAB — CBC WITH DIFFERENTIAL/PLATELET
Abs Immature Granulocytes: 0.03 K/uL (ref 0.00–0.07)
Basophils Absolute: 0 K/uL (ref 0.0–0.1)
Basophils Relative: 0 %
Eosinophils Absolute: 0.3 K/uL (ref 0.0–0.5)
Eosinophils Relative: 4 %
HCT: 28.8 % — ABNORMAL LOW (ref 36.0–46.0)
Hemoglobin: 10 g/dL — ABNORMAL LOW (ref 12.0–15.0)
Immature Granulocytes: 0 %
Lymphocytes Relative: 26 %
Lymphs Abs: 1.9 K/uL (ref 0.7–4.0)
MCH: 31.2 pg (ref 26.0–34.0)
MCHC: 34.7 g/dL (ref 30.0–36.0)
MCV: 89.7 fL (ref 80.0–100.0)
Monocytes Absolute: 0.6 K/uL (ref 0.1–1.0)
Monocytes Relative: 8 %
Neutro Abs: 4.4 K/uL (ref 1.7–7.7)
Neutrophils Relative %: 62 %
Platelets: 202 K/uL (ref 150–400)
RBC: 3.21 MIL/uL — ABNORMAL LOW (ref 3.87–5.11)
RDW: 14 % (ref 11.5–15.5)
WBC: 7.2 K/uL (ref 4.0–10.5)
nRBC: 0 % (ref 0.0–0.2)

## 2024-12-07 LAB — NA AND K (SODIUM & POTASSIUM), RAND UR
Potassium Urine: <10 mmol/L
Sodium, Ur: 32 mmol/L

## 2024-12-07 LAB — OSMOLALITY, URINE: Osmolality, Ur: 138 mosm/kg — ABNORMAL LOW (ref 300–900)

## 2024-12-07 LAB — OSMOLALITY: Osmolality: 265 mosm/kg — ABNORMAL LOW (ref 275–295)

## 2024-12-07 MED ORDER — ACETAMINOPHEN 500 MG PO TABS
1000.0000 mg | ORAL_TABLET | Freq: Once | ORAL | Status: AC
  Administered 2024-12-07: 1000 mg via ORAL
  Filled 2024-12-07: qty 2

## 2024-12-07 MED ORDER — SODIUM CHLORIDE 0.9 % IV SOLN: Freq: Once | INTRAVENOUS | Status: AC

## 2024-12-07 NOTE — ED Provider Notes (Signed)
 [place] EMERGENCY DEPARTMENT AT [facility] Provider Note   CSN: [id number] Arrival date & time: 12/07/24  8286     Patient presents with: Abnormal Lab   Felicia Washington is a 78 y.o. female.   Patient here with low sodium.  Had a sodium of 125 at primary care doctor's office and sent here for workup.  She has had confusion the last week.  She just stopped her chlorthalidone  last week.  She does have celiac disease.  Somewhat has had decreased appetite recently.  She denies any nausea vomiting diarrhea otherwise.  She denies any headache fever chills.  Family just states that she has been a little bit lethargic not quite like herself some confusion.  Maybe some mild hyponatremia in the past but nothing this low before.  No new medications otherwise.  She is on blood thinner.  The history is provided by the patient and a caregiver.       Prior to Admission medications  Medication Sig Start Date End Date Taking? Authorizing Provider  acetaminophen  (TYLENOL ) 500 MG tablet Take 1,000 mg by mouth every 6 (six) hours as needed for mild pain (pain score 1-3).    [provider]  albuterol  (VENTOLIN  HFA) 108 (90 Base) MCG/ACT inhaler Inhale 2 puffs into the lungs every 8 (eight) hours as needed for shortness of breath or wheezing. 06/19/24   [provider]  amLODipine  (NORVASC ) 10 MG tablet Take 1 tablet (10 mg total) by mouth every morning. 11/24/23 11/21/24  Ladona Heinz, MD  apixaban  (ELIQUIS ) 5 MG TABS tablet Take 1 tablet (5 mg total) by mouth 2 (two) times daily. 10/24/24   Terra Fairy PARAS, PA-C  atorvastatin  (LIPITOR) 40 MG tablet Take 40 mg by mouth in the morning.    [provider]  buPROPion  (WELLBUTRIN  XL) 150 MG 24 hr tablet Take 150 mg by mouth in the morning. Patient taking differently: Take 75 mg by mouth in the morning.    [provider]  chlorthalidone  (HYGROTON ) 25 MG tablet Take 25 mg by mouth every morning. 05/16/24   [provider]  clonazePAM (KLONOPIN) 0.5 MG tablet Take 0.5 mg by mouth at bedtime. 02/04/22   [provider]  famotidine  (PEPCID ) 20 MG tablet Take 20 mg by mouth at bedtime.    [provider]  Febuxostat  80 MG TABS Take 80 mg by mouth at bedtime.    [provider]  fenofibrate  micronized (LOFIBRA) 67 MG capsule Take 67 mg by mouth in the morning. 08/08/18   [provider]  levothyroxine  (SYNTHROID , LEVOTHROID) 112 MCG tablet Take 56-224 mcg by mouth See admin instructions. Take 1 tablets (112 mcg) by mouth on Mondays through Saturdays before breakfast. Take 2tablest (224 mcg) by mouth on Sundays before breakfast.    [provider]  metoprolol  succinate (TOPROL  XL) 50 MG 24 hr tablet Take 1 tablet (50 mg total) by mouth at bedtime. 11/11/23   Lucien Orren SAILOR, PA-C  montelukast  (SINGULAIR ) 10 MG tablet Take 10 mg by mouth at bedtime.    [provider]  olmesartan  (BENICAR ) 40 MG tablet Take 1 tablet (40 mg total) by mouth every evening. 11/11/23   Lucien Orren SAILOR, PA-C  oxyCODONE  (OXY IR/ROXICODONE ) 5 MG immediate release tablet Take 1 tablet (5 mg total) by mouth every 4 (four) hours as needed for moderate pain (pain score 4-6). 08/23/24   Danton Reyes DASEN, MD  potassium chloride  SA (KLOR-CON  M) 20 MEQ tablet Take 1  tablet (20 mEq total) by mouth daily. Patient taking differently: Take 10 mEq by mouth daily. 09/25/24   [place], PA-C    Allergies: Adhesive [tape], Allopurinol , Codeine, and Latex    Review of Systems  Updated Vital Signs BP 124/70 (BP Location: Right Arm)   Pulse 76   Temp 98 F (36.7 C) (Oral)   Resp 20   Ht 5' 4 (1.626 m)   Wt 58.1 kg   SpO2 100%   BMI 21.97 kg/m   Physical Exam Vitals and nursing note reviewed.  Constitutional:      General: She is not in acute distress.    Appearance: She is well-developed. She is not ill-appearing.  HENT:     Head: Normocephalic and atraumatic.     Nose: Nose  normal.     Mouth/Throat:     Mouth: Mucous membranes are dry.  Eyes:     Extraocular Movements: Extraocular movements intact.     Conjunctiva/sclera: Conjunctivae normal.     Pupils: Pupils are equal, round, and reactive to light.  Cardiovascular:     Rate and Rhythm: Normal rate and regular rhythm.     Pulses: Normal pulses.     Heart sounds: Normal heart sounds. No murmur heard. Pulmonary:     Effort: Pulmonary effort is normal. No respiratory distress.     Breath sounds: Normal breath sounds.  Abdominal:     General: Abdomen is flat.     Palpations: Abdomen is soft.     Tenderness: There is no abdominal tenderness.  Musculoskeletal:        General: No swelling.     Cervical back: Normal range of motion and neck supple.  Skin:    General: Skin is warm and dry.     Capillary Refill: Capillary refill takes less than 2 seconds.  Neurological:     General: No focal deficit present.     Mental Status: She is alert and oriented to person, place, and time.     Cranial Nerves: No cranial nerve deficit.     Sensory: No sensory deficit.     Motor: No weakness.     Coordination: Coordination normal.  Psychiatric:        Mood and Affect: Mood normal.     (all labs ordered are listed, but only abnormal results are displayed) Labs Reviewed  CBC WITH DIFFERENTIAL/PLATELET - Abnormal; Notable for the following components:      Result Value   RBC 3.21 (*)    Hemoglobin 10.0 (*)    HCT 28.8 (*)    All other components within normal limits  BASIC METABOLIC PANEL WITH GFR - Abnormal; Notable for the following components:   Sodium 126 (*)    Chloride 93 (*)    CO2 21 (*)    BUN 25 (*)    All other components within normal limits  NA AND K (SODIUM & POTASSIUM), RAND UR  OSMOLALITY, URINE  OSMOLALITY    EKG: EKG Interpretation Date/Time:  Thursday December 07 2024 19:02:22 EST Ventricular Rate:  67 PR Interval:  181 QRS Duration:  97 QT Interval:  382 QTC Calculation: 404 R  Axis:   75  Text Interpretation: Sinus rhythm Atrial premature complexes Confirmed by Ruthe Cornet [phone]) on 12/07/2024 7:07:13 PM  Radiology: [facility] Chest Portable 1 View Result Date: 12/07/2024 CLINICAL DATA:  Cough. EXAM: PORTABLE CHEST 1 VIEW COMPARISON:  Chest radiograph dated 08/18/2024. FINDINGS: No focal consolidation, pleural effusion, pneumothorax. The cardiac silhouette is within  limits. Loop recorder device. Previously seen hiatal hernia is no longer visualized. Osteopenia with degenerative changes. No acute osseous pathology. Right shoulder arthroplasty. IMPRESSION: No active disease. Electronically Signed   By: Vanetta Chou M.D.   On: 12/07/2024 17:55     .Critical Care  Performed by: Ruthe Cornet, DO Authorized by: Ruthe Cornet, DO   Critical care provider statement:    Critical care time (minutes):  35   Critical care was necessary to treat or prevent imminent or life-threatening deterioration of the following conditions: hyponatremia.   Critical care was time spent personally by me on the following activities:  Blood draw for specimens, development of treatment plan with patient or surrogate, discussions with primary provider, evaluation of patient's response to treatment, examination of patient, obtaining history from patient or surrogate, ordering and performing treatments and interventions, ordering and review of laboratory studies, ordering and review of radiographic studies and pulse oximetry   Care discussed with: admitting provider      Medications Ordered in the ED  0.9 %  sodium chloride  infusion ( Intravenous New Bag/Given 12/07/24 1854)                                    Medical Decision Making Amount and/or Complexity of Data Reviewed Labs: ordered. Radiology: ordered.  Risk Prescription drug management. Decision regarding hospitalization.   Felicia Washington is here for generalized fatigue and weakness found to have a sodium of 125 at primary  care and they sent her for evaluation.  She got normal vitals.  No fever.  History of celiac disease.  She was just taken off of chlorthalidone .  She is on Eliquis .  Maybe some trace edema on her legs but she does not have any history of heart failure.  She has had decreased p.o. intake.  She looks a little bit dry on exam.  Overall new hyponatremia sodium of 125.  Other lab work is done by primary care was unremarkable.  Mild AKI.  Will get repeat labs as they need to get a CBC.  She had labs drawn around noon today.  Will get chest x-ray.  Anticipate admission for further treatment of hyponatremia.  I do not see any other obvious medications that would cause this.  So I do think this could be from being volume down but will need more workup for this.  I have confirmed hyponatremia sodium is 126 here.  No signs of volume overload on x-ray of your chest.  Lab work is otherwise unremarkable.  Will have her admitted to hospital for further hyponatremia care.  This chart was dictated using voice recognition software.  Despite best efforts to proofread,  errors can occur which can change the documentation meaning.      Final diagnoses:  Hyponatremia    ED Discharge Orders     None          Ruthe Cornet, DO 12/07/24 2004

## 2024-12-07 NOTE — Plan of Care (Signed)
 Plan of Care Note for accepted transfer   Patient name: Felicia Washington FMW:[id number] DOB: 04/29/1946  Facility requesting transfer: [facility] Requesting Provider: Dr. 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  Sodium was previously 138 on 09/29/2024.  Patient was on chlorthalidone  which was stopped last week.  Vital signs stable.  Repeat labs in the ED showing sodium 126.  Chest x-ray showing no active disease.  Patient was given IV fluids.  Plan of care: The patient is accepted for admission to Progressive unit at [facility].  [facility] will assume care on arrival to accepting facility. Until arrival, care as per EDP. However, [facility] available 24/7 for questions and assistance.  Check [email or web address] for on-call coverage.  Nursing staff, please call [facility] Admits & Consults System-Wide number under Amion on patient's arrival so appropriate admitting provider can evaluate the pt.

## 2024-12-07 NOTE — Telephone Encounter (Signed)
 Called and discussed hyponatremia with daughter.  Advised her to take patient to the emergency department given she is confused and disoriented.  They are getting in the car now and headed to [facility].

## 2024-12-07 NOTE — ED Triage Notes (Signed)
 Pt POV with daughter reporting sodium 125 today per PCP, pt experiencing mild confusion x1 week, advised to be seen in ED for fluids and further evaluation.

## 2024-12-08 ENCOUNTER — Other Ambulatory Visit (HOSPITAL_COMMUNITY): Payer: Self-pay

## 2024-12-08 ENCOUNTER — Encounter (HOSPITAL_COMMUNITY): Payer: Self-pay | Admitting: Internal Medicine

## 2024-12-08 DIAGNOSIS — I480 Paroxysmal atrial fibrillation: Secondary | ICD-10-CM | POA: Diagnosis present

## 2024-12-08 DIAGNOSIS — Z885 Allergy status to narcotic agent status: Secondary | ICD-10-CM | POA: Diagnosis not present

## 2024-12-08 DIAGNOSIS — E785 Hyperlipidemia, unspecified: Secondary | ICD-10-CM | POA: Diagnosis present

## 2024-12-08 DIAGNOSIS — E861 Hypovolemia: Secondary | ICD-10-CM | POA: Diagnosis present

## 2024-12-08 DIAGNOSIS — Z7901 Long term (current) use of anticoagulants: Secondary | ICD-10-CM | POA: Diagnosis not present

## 2024-12-08 DIAGNOSIS — Z79899 Other long term (current) drug therapy: Secondary | ICD-10-CM | POA: Diagnosis not present

## 2024-12-08 DIAGNOSIS — E871 Hypo-osmolality and hyponatremia: Secondary | ICD-10-CM | POA: Diagnosis present

## 2024-12-08 DIAGNOSIS — I10 Essential (primary) hypertension: Secondary | ICD-10-CM | POA: Diagnosis present

## 2024-12-08 DIAGNOSIS — Z91040 Latex allergy status: Secondary | ICD-10-CM | POA: Diagnosis not present

## 2024-12-08 DIAGNOSIS — K900 Celiac disease: Secondary | ICD-10-CM | POA: Diagnosis present

## 2024-12-08 DIAGNOSIS — Z96611 Presence of right artificial shoulder joint: Secondary | ICD-10-CM | POA: Diagnosis present

## 2024-12-08 DIAGNOSIS — Z79890 Hormone replacement therapy (postmenopausal): Secondary | ICD-10-CM | POA: Diagnosis not present

## 2024-12-08 DIAGNOSIS — Z833 Family history of diabetes mellitus: Secondary | ICD-10-CM | POA: Diagnosis not present

## 2024-12-08 DIAGNOSIS — Z83719 Family history of colon polyps, unspecified: Secondary | ICD-10-CM | POA: Diagnosis not present

## 2024-12-08 DIAGNOSIS — R451 Restlessness and agitation: Secondary | ICD-10-CM | POA: Diagnosis present

## 2024-12-08 DIAGNOSIS — R7989 Other specified abnormal findings of blood chemistry: Secondary | ICD-10-CM | POA: Diagnosis present

## 2024-12-08 DIAGNOSIS — Z8673 Personal history of transient ischemic attack (TIA), and cerebral infarction without residual deficits: Secondary | ICD-10-CM | POA: Diagnosis not present

## 2024-12-08 DIAGNOSIS — Z8379 Family history of other diseases of the digestive system: Secondary | ICD-10-CM | POA: Diagnosis not present

## 2024-12-08 DIAGNOSIS — F05 Delirium due to known physiological condition: Secondary | ICD-10-CM | POA: Diagnosis present

## 2024-12-08 DIAGNOSIS — J45909 Unspecified asthma, uncomplicated: Secondary | ICD-10-CM | POA: Diagnosis present

## 2024-12-08 DIAGNOSIS — Z8249 Family history of ischemic heart disease and other diseases of the circulatory system: Secondary | ICD-10-CM | POA: Diagnosis not present

## 2024-12-08 DIAGNOSIS — F411 Generalized anxiety disorder: Secondary | ICD-10-CM | POA: Diagnosis present

## 2024-12-08 DIAGNOSIS — Z8349 Family history of other endocrine, nutritional and metabolic diseases: Secondary | ICD-10-CM | POA: Diagnosis not present

## 2024-12-08 DIAGNOSIS — Z807 Family history of other malignant neoplasms of lymphoid, hematopoietic and related tissues: Secondary | ICD-10-CM | POA: Diagnosis not present

## 2024-12-08 LAB — BASIC METABOLIC PANEL WITH GFR
Anion gap: 10 (ref 5–15)
BUN: 21 mg/dL (ref 8–23)
CO2: 19 mmol/L — ABNORMAL LOW (ref 22–32)
Calcium: 8.9 mg/dL (ref 8.9–10.3)
Chloride: 101 mmol/L (ref 98–111)
Creatinine, Ser: 0.97 mg/dL (ref 0.44–1.00)
GFR, Estimated: >60 mL/min (ref >60)
Glucose, Bld: 120 mg/dL — ABNORMAL HIGH (ref 70–99)
Potassium: 4.2 mmol/L (ref 3.5–5.1)
Sodium: 130 mmol/L — ABNORMAL LOW (ref 135–145)

## 2024-12-08 LAB — T4, FREE: Free T4: 0.38 ng/dL — ABNORMAL LOW (ref 0.61–1.12)

## 2024-12-08 LAB — SODIUM
Sodium: 131 mmol/L — ABNORMAL LOW (ref 135–145)
Sodium: 131 mmol/L — ABNORMAL LOW (ref 135–145)

## 2024-12-08 LAB — PHOSPHORUS: Phosphorus: 2.7 mg/dL (ref 2.5–4.6)

## 2024-12-08 LAB — TSH: TSH: 86.8 u[IU]/mL — ABNORMAL HIGH (ref 0.350–4.500)

## 2024-12-08 LAB — MAGNESIUM: Magnesium: 2 mg/dL (ref 1.7–2.4)

## 2024-12-08 MED ORDER — FENOFIBRATE 67 MG PO CAPS: 67.0000 mg | ORAL_CAPSULE | Freq: Every morning | ORAL | Status: DC

## 2024-12-08 MED ORDER — CLONAZEPAM 0.5 MG PO TABS: 0.5000 mg | ORAL_TABLET | Freq: Every evening | ORAL | Status: DC | PRN

## 2024-12-08 MED ORDER — METOPROLOL SUCCINATE ER 50 MG PO TB24
50.0000 mg | ORAL_TABLET | Freq: Every day | ORAL | Status: DC
  Administered 2024-12-08: 50 mg via ORAL
  Filled 2024-12-08: qty 1

## 2024-12-08 MED ORDER — LEVOTHYROXINE SODIUM 112 MCG PO TABS: 56.0000 ug | ORAL_TABLET | ORAL | Status: DC

## 2024-12-08 MED ORDER — SODIUM CHLORIDE 0.9 % IV SOLN: INTRAVENOUS | Status: DC

## 2024-12-08 MED ORDER — FENOFIBRATE 54 MG PO TABS
54.0000 mg | ORAL_TABLET | Freq: Every day | ORAL | Status: DC
  Administered 2024-12-08 – 2024-12-09 (×2): 54 mg via ORAL
  Filled 2024-12-08 (×2): qty 1

## 2024-12-08 MED ORDER — LEVOTHYROXINE SODIUM 112 MCG PO TABS
112.0000 ug | ORAL_TABLET | ORAL | Status: DC
  Administered 2024-12-08 – 2024-12-09 (×2): 112 ug via ORAL
  Filled 2024-12-08 (×2): qty 1

## 2024-12-08 MED ORDER — ACETAMINOPHEN 500 MG PO TABS
500.0000 mg | ORAL_TABLET | Freq: Four times a day (QID) | ORAL | Status: DC | PRN
  Filled 2024-12-08: qty 1

## 2024-12-08 MED ORDER — LEVOTHYROXINE SODIUM 112 MCG PO TABS: 224.0000 ug | ORAL_TABLET | ORAL | Status: DC

## 2024-12-08 MED ORDER — PROCHLORPERAZINE EDISYLATE 10 MG/2ML IJ SOLN: 5.0000 mg | Freq: Four times a day (QID) | INTRAMUSCULAR | Status: DC | PRN

## 2024-12-08 MED ORDER — POLYETHYLENE GLYCOL 3350 17 G PO PACK: 17.0000 g | PACK | Freq: Every day | ORAL | Status: DC | PRN

## 2024-12-08 MED ORDER — APIXABAN 5 MG PO TABS: 5.0000 mg | ORAL_TABLET | Freq: Two times a day (BID) | ORAL | 0 refills | Status: DC

## 2024-12-08 MED ORDER — APIXABAN 5 MG PO TABS: 5.0000 mg | ORAL_TABLET | Freq: Two times a day (BID) | ORAL | Status: AC

## 2024-12-08 MED ORDER — APIXABAN 5 MG PO TABS
5.0000 mg | ORAL_TABLET | Freq: Two times a day (BID) | ORAL | Status: DC
  Administered 2024-12-08 – 2024-12-09 (×4): 5 mg via ORAL
  Filled 2024-12-08 (×4): qty 1

## 2024-12-08 MED ORDER — SODIUM CHLORIDE 0.9 % IV SOLN: INTRAVENOUS | Status: AC

## 2024-12-08 MED ORDER — MELATONIN 5 MG PO TABS
5.0000 mg | ORAL_TABLET | Freq: Every evening | ORAL | Status: DC | PRN
  Filled 2024-12-08: qty 1

## 2024-12-08 MED ADMIN — Atorvastatin Calcium Tab 40 MG (Base Equivalent): 40 mg | ORAL | NDC 50268009511

## 2024-12-08 MED ADMIN — Montelukast Sodium Tab 10 MG (Base Equiv): 10 mg | ORAL | NDC 68084087511

## 2024-12-08 MED ADMIN — Buspirone HCl Tab 5 MG: 5 mg | ORAL | NDC 69584009110

## 2024-12-08 MED ADMIN — Buspirone HCl Tab 5 MG: 5 mg | ORAL | NDC 51079098501

## 2024-12-08 MED FILL — Montelukast Sodium Tab 10 MG (Base Equiv): 10.0000 mg | ORAL | Qty: 1 | Status: AC

## 2024-12-08 MED FILL — Atorvastatin Calcium Tab 40 MG (Base Equivalent): 40.0000 mg | ORAL | Qty: 1 | Status: AC

## 2024-12-08 MED FILL — Buspirone HCl Tab 5 MG: 5.0000 mg | ORAL | Qty: 1 | Status: AC

## 2024-12-08 NOTE — H&P (Signed)
 History and Physical  Felicia Washington FMW:[id number] DOB: May 27, 1946 DOA: 12/07/2024  Referring physician: Accepted by Dr. Alfornia [facility], hospitalist service. PCP: Chinita Hoy CROME, PA-C  Outpatient Specialists: GI, cardiology. Patient coming from: Home through [facility] ED.  Chief Complaint: Low sodium level.  HPI: Felicia Washington is a 78 y.o. female with medical history significant for generalized anxiety disorder, hypothyroidism, paroxysmal A-fib on Eliquis , hyperlipidemia, hypertension on chlorthalidone , who presented to the ER due to low serum sodium level, 125, referred to by her PCP.  The patient endorses mild confusion for about a week.  States she is always cold.  Denies headache, nausea, vomiting, or diarrhea.  In the ER, vital signs are stable.  Serum sodium 126.  BUN 25 creatinine 0.94.  Due to concern for hypovolemic hyponatremia, the patient was started on IV fluid NS.  Admitted by [facility], hospitalist service.  Accepted by Dr. Alfornia and transferred to [facility] progressive care unit as observation status.  At the time of this visit, the patient has no complaints.  ED Course: Temperature 98.1.  BP 118/77, pulse 77, respiration rate 14, O2 saturation 100% on room air.  Review of Systems: Review of systems as noted in the HPI. All other systems reviewed and are negative.   Past Medical History:  Diagnosis Date   Anemia    Arthritis    fingers, shoulders (09/01/2018)   Asthma    Celiac disease    Gallstones 09/21/2024   GERD (gastroesophageal reflux disease)    Graves disease    Heart murmur    Hiatal hernia    History of blood transfusion    related to c-section   History of gout    fingers   HLD (hyperlipidemia)    HTN (hypertension)    Hypothyroidism    Migraine    1 q couple years (09/01/2018)   Pneumonia    several times in 1 yr (09/01/2018)   PONV (postoperative nausea and vomiting)    Seasonal allergies    Stroke [facility])    jan 2023    Past Surgical History:  Procedure Laterality Date   BUBBLE STUDY  09/23/2022   Procedure: BUBBLE STUDY;  Surgeon: Michele Richardson, DO;  Location: [facility] ENDOSCOPY;  Service: Cardiovascular;;   CESAREAN SECTION  1975   COLONOSCOPY     HIATAL HERNIA REPAIR N/A 08/21/2024   Procedure: REPAIR, HERNIA, HIATAL, LAPAROSCOPIC;  Surgeon: Kinsinger, Herlene Righter, MD;  Location: [facility] OR;  Service: General;  Laterality: N/A;   LAPAROSCOPIC GASTROSTOMY N/A 08/21/2024   Procedure: CREATION, GASTROSTOMY, LAPAROSCOPIC;  Surgeon: Stevie Herlene Righter, MD;  Location: [facility] OR;  Service: General;  Laterality: N/A;   POLYPECTOMY     REVERSE SHOULDER ARTHROPLASTY Right 09/01/2018   REVERSE SHOULDER ARTHROPLASTY Right 09/01/2018   Procedure: RIGHT REVERSE SHOULDER ARTHROPLASTY;  Surgeon: Melita Drivers, MD;  Location: [facility] OR;  Service: Orthopedics;  Laterality: Right;   SHOULDER ARTHROSCOPY W/ ROTATOR CUFF REPAIR Bilateral    TEE WITHOUT CARDIOVERSION N/A 09/23/2022   Procedure: TRANSESOPHAGEAL ECHOCARDIOGRAM (TEE);  Surgeon: Michele Richardson, DO;  Location: [facility] ENDOSCOPY;  Service: Cardiovascular;  Laterality: N/A;   THYROIDECTOMY     TONSILLECTOMY      Social History:  reports that she has never smoked. She has never used smokeless tobacco. She reports that she does not drink alcohol and does not use drugs.   Allergies[1]  Family History  Problem Relation Age of Onset   Thyroid  disease Mother    Gallstones Mother  Multiple myeloma Father    Bone cancer Father    Celiac disease Sister    Colon polyps Sister    Colon polyps Brother    Diabetes Maternal Grandmother    Heart attack Maternal Grandfather    Sleep apnea Daughter    Colon cancer Neg Hx       Prior to Admission medications  Medication Sig Start Date End Date Taking? Authorizing Provider  acetaminophen  (TYLENOL ) 500 MG tablet Take 1,000 mg by mouth every 6 (six) hours as needed for mild pain (pain score 1-3).    [provider]  albuterol   (VENTOLIN  HFA) 108 (90 Base) MCG/ACT inhaler Inhale 2 puffs into the lungs every 8 (eight) hours as needed for shortness of breath or wheezing. 06/19/24   [provider]  amLODipine  (NORVASC ) 10 MG tablet Take 1 tablet (10 mg total) by mouth every morning. 11/24/23 11/21/24  Ladona Heinz, MD  apixaban  (ELIQUIS ) 5 MG TABS tablet Take 1 tablet (5 mg total) by mouth 2 (two) times daily. 10/24/24   Terra Fairy PARAS, PA-C  atorvastatin  (LIPITOR) 40 MG tablet Take 40 mg by mouth in the morning.    [provider]  buPROPion  (WELLBUTRIN  XL) 150 MG 24 hr tablet Take 150 mg by mouth in the morning. Patient taking differently: Take 75 mg by mouth in the morning.    [provider]  chlorthalidone  (HYGROTON ) 25 MG tablet Take 25 mg by mouth every morning. 05/16/24   [provider]  clonazePAM (KLONOPIN) 0.5 MG tablet Take 0.5 mg by mouth at bedtime. 02/04/22   [provider]  famotidine  (PEPCID ) 20 MG tablet Take 20 mg by mouth at bedtime.    [provider]  Febuxostat  80 MG TABS Take 80 mg by mouth at bedtime.    [provider]  fenofibrate  micronized (LOFIBRA) 67 MG capsule Take 67 mg by mouth in the morning. 08/08/18   [provider]  levothyroxine  (SYNTHROID , LEVOTHROID) 112 MCG tablet Take 56-224 mcg by mouth See admin instructions. Take 1 tablets (112 mcg) by mouth on Mondays through Saturdays before breakfast. Take 2tablest (224 mcg) by mouth on Sundays before breakfast.    [provider]  metoprolol  succinate (TOPROL  XL) 50 MG 24 hr tablet Take 1 tablet (50 mg total) by mouth at bedtime. 11/11/23   Lucien Orren SAILOR, PA-C  montelukast  (SINGULAIR ) 10 MG tablet Take 10 mg by mouth at bedtime.    [provider]  olmesartan  (BENICAR ) 40 MG tablet Take 1 tablet (40 mg total) by mouth every evening. 11/11/23   Lucien Orren SAILOR, PA-C  oxyCODONE  (OXY IR/ROXICODONE ) 5 MG immediate release tablet Take 1 tablet (5 mg total) by  mouth every 4 (four) hours as needed for moderate pain (pain score 4-6). 08/23/24   Danton Reyes DASEN, MD  potassium chloride  SA (KLOR-CON  M) 20 MEQ tablet Take 1 tablet (20 mEq total) by mouth daily. Patient taking differently: Take 10 mEq by mouth daily. 09/25/24   [place], PA-C    Physical Exam: BP 126/80 (BP Location: Right Arm)   Pulse 70   Temp 98 F (36.7 C) (Oral)   Resp 16   Ht 5' 4 (1.626 m)   Wt 58.1 kg   SpO2 100%   BMI 21.97 kg/m   General: 78 y.o. year-old female well developed well nourished in no acute distress.  Alert and oriented x3. Cardiovascular: Regular rate and rhythm with no rubs or gallops.  No thyromegaly or JVD  noted.  No lower extremity edema. 2/4 pulses in all 4 extremities. Respiratory: Clear to auscultation with no wheezes or rales. Good inspiratory effort. Abdomen: Soft nontender nondistended with normal bowel sounds x4 quadrants. Muskuloskeletal: No cyanosis, clubbing or edema noted bilaterally Neuro: CN II-XII intact, strength, sensation, reflexes Skin: No ulcerative lesions noted or rashes Psychiatry: Judgement and insight appear normal. Mood is appropriate for condition and setting          Labs on Admission:  Basic Metabolic Panel: Recent Labs  Lab 12/07/24 1730  NA 126*  K 4.0  CL 93*  CO2 21*  GLUCOSE 92  BUN 25*  CREATININE 0.94  CALCIUM  10.0   Liver Function Tests: No results for input(s): AST, ALT, ALKPHOS, BILITOT, PROT, ALBUMIN  in the last 168 hours. No results for input(s): LIPASE, AMYLASE in the last 168 hours. No results for input(s): AMMONIA in the last 168 hours. CBC: Recent Labs  Lab 12/07/24 1730  WBC 7.2  NEUTROABS 4.4  HGB 10.0*  HCT 28.8*  MCV 89.7  PLT 202   Cardiac Enzymes: No results for input(s): CKTOTAL, CKMB, CKMBINDEX, TROPONINI in the last 168 hours.  BNP (last 3 results) No results for input(s): BNP in the last 8760 hours.  ProBNP (last 3 results) No results  for input(s): PROBNP in the last 8760 hours.  CBG: No results for input(s): GLUCAP in the last 168 hours.  Radiological Exams on Admission: DG Chest Portable 1 View Result Date: 12/07/2024 CLINICAL DATA:  Cough. EXAM: PORTABLE CHEST 1 VIEW COMPARISON:  Chest radiograph dated 08/18/2024. FINDINGS: No focal consolidation, pleural effusion, pneumothorax. The cardiac silhouette is within limits. Loop recorder device. Previously seen hiatal hernia is no longer visualized. Osteopenia with degenerative changes. No acute osseous pathology. Right shoulder arthroplasty. IMPRESSION: No active disease. Electronically Signed   By: Vanetta Chou M.D.   On: 12/07/2024 17:55    EKG: I independently viewed the EKG done and my findings are as followed: Sinus rhythm 69.  QTc 402.  Assessment/Plan Present on Admission:  Hyponatremia  Principal Problem:   Hyponatremia  Hypovolemic hyponatremia Presented with serum sodium 126 Follow urine lites. Continue IV fluid NS at 100 cc/h x 12 hours. Repeat BMP Encourage oral intake.  Hypothyroidism Resume home levothyroxine  Follow TSH  Paroxysmal A-fib on Eliquis  Resume home Eliquis . Resume home Toprol -XL when blood pressure allows.  Hypertension BP is currently at goal 118/77 Continue to monitor vital signs   Time: 75 minutes.   DVT prophylaxis: Home Eliquis  twice daily.  Code Status: Full code.  Family Communication: Significant other at bedside.  Disposition Plan: Admitted to progressive care unit.  Consults called: None.  Admission status: Observation status.   Status is: Observation    Terry LOISE Hurst MD [facility] Pager [phone]  If 7PM-7AM, please contact night-coverage [email or web address] Password [name]  12/08/2024, 4:27 AM      [1]  Allergies Allergen Reactions   Adhesive [Tape] Other (See Comments)    Paper tape only   Allopurinol  Swelling, Hives and Itching   Codeine Nausea Only   Latex Other (See  Comments)    Removes skin

## 2024-12-08 NOTE — Progress Notes (Signed)
 PROGRESS NOTE    Felicia Washington  FMW:[id number] DOB: [birth year] DOA: 12/07/2024 PCP: Chinita Hoy CROME, PA-C    Brief Narrative:   Felicia Washington is a 78 y.o. 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  Was seen by PCP earlier in the day with labs notable for sodium of 125.  Onset of confusion for roughly 1 week.  Denies headache, no nausea/vomiting, no diarrhea.  In the ED, temperature 98.3 F, HR 67, RR 17, BP 109/73, SpO2 100% on room air.  WBC 7.2, hemoglobin 10.0, platelet count 202.  Sodium 126, potassium 4.0, chloride 93, CO2 21, glucose 92, BUN 25, creatinine 0.94.  Serum osmolality 265.  Urine osmolality 138, urine sodium 32.  TSH is 86.800, free T4 0.38.  Chest x-ray with no active cardiopulmonary disease process.  Patient was started on IV normal saline.  TRH was consulted for admission and patient was transferred to [facility] for further evaluation management.  Assessment & Plan:   Hypovolemic hyponatremia Patient presenting with confusion x 1 week.  Was seen by PCP with sodium level of 125. (Most recent level 138 on 09/29/2024).  Serum osmolality 265, urine osmolality 138, urine sodium 32.  TSH elevated 86.800 with free T4 0.38.   -- Na 126>130 -- continue IVF w/ NS at 100 mL/h -- Continue to monitor sodium every 8 hours -- Monitor on telemetry -- BMP in a.m.  Paroxysmal atrial fibrillation HTN -- Metoprolol  succinate 50 mg p.o. daily -- Holding home amlodipine -olmesartan  for now given borderline blood pressures -- Eliquis  5 g p.o. twice daily  Hypothyroidism Follows with endocrinology outpatient, Dr. Nichole.  TSH elevated 86.800 with free T40.38.  Home dose of levothyroxine  112 mcg  M/T/W/T/F/Sat in 224 mcg every Sunday -- Continue home dose of levothyroxine  for now -- Messaged Dr. Nichole regarding abnormal LFTs and further recommendations  Asthma -- Montelukast    HLD -- Atorvastatin  40 mg p.o. daily -- Fenofibrate  67 mg p.o. daily  Generalized anxiety disorder -- BuSpar 5 mg p.o. twice daily -- Clonazepam 0.5 mg p.o. nightly as needed anxiety   DVT prophylaxis:  apixaban  (ELIQUIS ) tablet 5 mg    Code Status: Full Code Family Communication: Updated family present at bedside this morning  Disposition Plan:  Level of care: Progressive Status is: Observation The patient remains OBS appropriate and will d/c before 2 midnights.    Consultants:  None  Procedures:  None  Antimicrobials:  None   Subjective: Patient seen examined bedside, lying in bed.  Daughter present at bedside.  Confusion improved.  Sodium level also improving up to 130 this morning.  Remains on IV fluids.  Discussed elevated TSH with patient and daughter.  Daughter reports that she manages her mothers medications and is not aware of any missed doses.  Discussed with pharmacist, have messaged Dr. Nichole for recommendations regarding TFTs and any adjustments if needed at this point.  Patient denies headache, no dizziness, no chest pain, no palpitations, no shortness of breath, no abdominal pain, no fever/chills/night sweats, no nausea/vomiting/diarrhea, no focal weakness, no fatigue, no paresthesias.  No acute events overnight per staff.  Objective: Vitals:   12/08/24 0048 12/08/24 0435 12/08/24 0752 12/08/24 1116  BP: 126/80 118/77 110/73 118/86  Pulse: 70 77 81 77  Resp: 16 16 18  18  Temp: 98 F (36.7 C) 98.1 F (36.7 C) 99.1 F (37.3 C) 98.1 F (36.7 C)  TempSrc: Oral  Oral Oral  SpO2: 100% 100% 100% 100%  Weight:      Height:       No intake or output data in the 24 hours ending 12/08/24 1209 Filed Weights   12/07/24 1728  Weight: 58.1 kg    Examination:  Physical  Exam: GEN: NAD, alert and oriented x 3, elderly in appearance HEENT: NCAT, PERRL, EOMI, sclera clear, MMM PULM: CTAB w/o wheezes/crackles, normal respiratory effort, on room air CV: RRR w/o M/G/R GI: abd soft, NTND, + BS MSK: no peripheral edema NEURO: CN II-XII intact, no focal deficits, sensation to light touch intact PSYCH: Depressed mood, flat affect Integumentary: No concerning rashes/skin/wounds nonexposed skin surfaces    Data Reviewed: I have personally reviewed following labs and imaging studies  CBC: Recent Labs  Lab 12/07/24 1730  WBC 7.2  NEUTROABS 4.4  HGB 10.0*  HCT 28.8*  MCV 89.7  PLT 202   Basic Metabolic Panel: Recent Labs  Lab 12/07/24 1730 12/08/24 0452  NA 126* 130*  K 4.0 4.2  CL 93* 101  CO2 21* 19*  GLUCOSE 92 120*  BUN 25* 21  CREATININE 0.94 0.97  CALCIUM  10.0 8.9  MG  --  2.0  PHOS  --  2.7   GFR: Estimated Creatinine Clearance: 42.6 mL/min (by C-G formula based on SCr of 0.97 mg/dL). Liver Function Tests: No results for input(s): AST, ALT, ALKPHOS, BILITOT, PROT, ALBUMIN  in the last 168 hours. No results for input(s): LIPASE, AMYLASE in the last 168 hours. No results for input(s): AMMONIA in the last 168 hours. Coagulation Profile: No results for input(s): INR, PROTIME in the last 168 hours. Cardiac Enzymes: No results for input(s): CKTOTAL, CKMB, CKMBINDEX, TROPONINI in the last 168 hours. BNP (last 3 results) No results for input(s): PROBNP in the last 8760 hours. HbA1C: No results for input(s): HGBA1C in the last 72 hours. CBG: No results for input(s): GLUCAP in the last 168 hours. Lipid Profile: No results for input(s): CHOL, HDL, LDLCALC, TRIG, CHOLHDL, LDLDIRECT in the last 72 hours. Thyroid  Function Tests: Recent Labs    12/08/24 0452  TSH 86.800*  FREET4 0.38*   Anemia Panel: No results for input(s): VITAMINB12, FOLATE, FERRITIN, TIBC, IRON, RETICCTPCT in  the last 72 hours. Sepsis Labs: No results for input(s): PROCALCITON, LATICACIDVEN in the last 168 hours.  No results found for this or any previous visit (from the past 240 hours).       Radiology Studies: DG Chest Portable 1 View Result Date: 12/07/2024 CLINICAL DATA:  Cough. EXAM: PORTABLE CHEST 1 VIEW COMPARISON:  Chest radiograph dated 08/18/2024. FINDINGS: No focal consolidation, pleural effusion, pneumothorax. The cardiac silhouette is within limits. Loop recorder device. Previously seen hiatal hernia is no longer visualized. Osteopenia with degenerative changes. No acute osseous pathology. Right shoulder arthroplasty. IMPRESSION: No active disease. Electronically Signed   By: Vanetta Chou M.D.   On: 12/07/2024 17:55        Scheduled Meds:  apixaban   5 mg Oral BID   levothyroxine   112 mcg Oral Once per day on Monday Tuesday Wednesday Thursday Friday Saturday   [START ON 12/10/2024] levothyroxine   224 mcg Oral Every Sunday   Continuous Infusions:  sodium chloride        LOS: 0 days    Time spent: 52 minutes spent on 12/08/2024 caring for this patient face-to-face including chart review, ordering  labs/tests, documenting, discussion with nursing staff, consultants, updating family and interview/physical exam    Felicia PARAS Deanthony Maull, DO [facility] Available via Epic secure chat 7am-7pm After these hours, please refer to coverage provider listed on [email or web address] 12/08/2024, 12:09 PM

## 2024-12-08 NOTE — Plan of Care (Signed)
°  Problem: Education: Goal: Knowledge of General Education information will improve Description: Including pain rating scale, medication(s)/side effects and non-pharmacologic comfort measures Outcome: Progressing   Problem: Clinical Measurements: Goal: Diagnostic test results will improve Outcome: Progressing Goal: Cardiovascular complication will be avoided Outcome: Progressing   Problem: Activity: Goal: Risk for activity intolerance will decrease Outcome: Progressing   Problem: Nutrition: Goal: Adequate nutrition will be maintained Outcome: Progressing   Problem: Elimination: Goal: Will not experience complications related to bowel motility Outcome: Progressing Goal: Will not experience complications related to urinary retention Outcome: Progressing   Problem: Pain Managment: Goal: General experience of comfort will improve and/or be controlled Outcome: Progressing   Problem: Safety: Goal: Ability to remain free from injury will improve Outcome: Progressing   Problem: Skin Integrity: Goal: Risk for impaired skin integrity will decrease Outcome: Progressing

## 2024-12-08 NOTE — TOC Initial Note (Signed)
 Transition of Care [facility]) - Initial/Assessment Note    Patient Details  Name: Felicia Washington MRN: [id number] Date of Birth: 03/13/1946  [facility] CM/SW Contact:    Bascom Service, RN Phone Number: 12/08/2024, 8:52 AM  Clinical Narrative:  d/c plan home.                 Expected Discharge Plan: Home/Self Care Barriers to Discharge: Continued Medical Work up   Patient Goals and CMS Choice Patient states their goals for this hospitalization and ongoing recovery are:: Home CMS Medicare.gov Compare Post Acute Care list provided to:: Patient Choice offered to / list presented to : Patient [place] ownership interest in [facility].provided to:: Patient    Expected Discharge Plan and Services   Discharge Planning Services: CM Consult   Living arrangements for the past 2 months: Single Family Home                                      Prior Living Arrangements/Services Living arrangements for the past 2 months: Single Family Home Lives with:: Self   Do you feel safe going back to the place where you live?: Yes               Activities of Daily Living   ADL Screening (condition at time of admission) Independently performs ADLs?: Yes (appropriate for developmental age) Is the patient deaf or have difficulty hearing?: No Does the patient have difficulty seeing, even when wearing glasses/contacts?: No Does the patient have difficulty concentrating, remembering, or making decisions?: No  Permission Sought/Granted Permission sought to share information with : Case Manager                Emotional Assessment              Admission diagnosis:  Hyponatremia [E87.1] Patient Active Problem List   Diagnosis Date Noted   Hyponatremia 12/07/2024   Gastric outlet obstruction 08/19/2024   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 08/18/2024   Encounter for loop recorder check 09/15/2022   Cerebrovascular accident (CVA)  (HCC) 09/15/2022   Abnormal liver function tests 03/03/2022   Acquired cerebral atrophy 03/03/2022   Diastolic dysfunction 03/03/2022   Loop recorder: [employer] LUX-Dx 02/10/2022 02/10/2022   CVA (cerebral vascular accident) (HCC) 01/01/2022   Cardiomegaly 01/01/2022   Acute lower UTI 01/01/2022   Acute metabolic encephalopathy 01/01/2022   Asthma    Spider veins of both lower extremities 02/22/2020   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 01/15/2020   Chronic kidney disease due to hypertension 04/14/2019   S/P reverse total shoulder arthroplasty, 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   Gout 06/17/2018   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   Postoperative hypothyroidism 11/13/2013   Migraine without aura, not refractory 08/26/2009   RHINOSINUSITIS, 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   URTICARIA 02/08/2009   COUGH 01/03/2009   Nonspecific (abnormal) findings on radiological and other examination of body structure 01/03/2009   ABNORMAL LUNG XRAY 01/03/2009   Hypothyroidism 03/16/2008   Hypercholesteremia 03/16/2008   Primary hypertension 03/16/2008   GERD 03/16/2008   Celiac disease 03/16/2008   COLONIC POLYPS 10/19/2007   ESOPHAGITIS, REFLUX 10/19/2007   DIVERTICULOSIS, COLON 10/19/2007   PCP:  Chinita Hoy CROME, PA-C Pharmacy:   [facility] [phone] - [place], [place] - [address] [place]  [address] Phone: [phone] Fax: [phone]  [facility] - [place], [place] - [address] [place] [address] Phone: [phone] Fax: [phone]     Social Drivers of Health (SDOH) Social History: SDOH Screenings   Food Insecurity: No Food Insecurity (12/08/2024)  Housing: Low Risk (12/08/2024)  Transportation Needs: No Transportation Needs (12/08/2024)  Utilities: Not At Risk (12/08/2024)  Financial Resource Strain: Low Risk (11/29/2024)   Received from [facility]   Physical Activity: Insufficiently Active (11/29/2024)   [facility] from [facility]  Social Connections: Moderately Isolated (12/08/2024)  Stress: No Stress Concern Present (11/29/2024)   [facility] from [facility]  Tobacco Use: Low Risk (12/08/2024)   SDOH Interventions:     Readmission Risk Interventions     No data to display

## 2024-12-08 NOTE — Addendum Note (Signed)
 Addended by: EZZARD PALMA F on: 12/08/2024 03:04 PM   Modules accepted: Orders

## 2024-12-09 DIAGNOSIS — E871 Hypo-osmolality and hyponatremia: Secondary | ICD-10-CM | POA: Diagnosis not present

## 2024-12-09 LAB — BASIC METABOLIC PANEL WITH GFR
Anion gap: 9 (ref 5–15)
BUN: 12 mg/dL (ref 8–23)
CO2: 24 mmol/L (ref 22–32)
Calcium: 9.6 mg/dL (ref 8.9–10.3)
Chloride: 99 mmol/L (ref 98–111)
Creatinine, Ser: 0.85 mg/dL (ref 0.44–1.00)
GFR, Estimated: >60 mL/min (ref >60)
Glucose, Bld: 85 mg/dL (ref 70–99)
Potassium: 3.9 mmol/L (ref 3.5–5.1)
Sodium: 132 mmol/L — ABNORMAL LOW (ref 135–145)

## 2024-12-09 MED ORDER — LORAZEPAM 2 MG/ML IJ SOLN
INTRAMUSCULAR | Status: AC
  Administered 2024-12-09: 1 mg
  Filled 2024-12-09: qty 1

## 2024-12-09 MED ORDER — HALOPERIDOL LACTATE 5 MG/ML IJ SOLN
2.0000 mg | Freq: Four times a day (QID) | INTRAMUSCULAR | Status: DC | PRN
  Administered 2024-12-09: 2 mg via INTRAVENOUS
  Filled 2024-12-09: qty 1

## 2024-12-09 MED ORDER — SODIUM CHLORIDE 0.9 % IV SOLN: INTRAVENOUS | Status: DC

## 2024-12-09 MED ORDER — HALOPERIDOL LACTATE 5 MG/ML IJ SOLN
2.5000 mg | Freq: Once | INTRAMUSCULAR | Status: AC
  Administered 2024-12-09: 2.5 mg via INTRAVENOUS
  Filled 2024-12-09: qty 1

## 2024-12-09 MED ORDER — LORAZEPAM 2 MG/ML IJ SOLN: 1.0000 mg | Freq: Once | INTRAMUSCULAR | Status: AC

## 2024-12-09 MED ADMIN — Atorvastatin Calcium Tab 40 MG (Base Equivalent): 40 mg | ORAL | NDC 50268009511

## 2024-12-09 MED ADMIN — Buspirone HCl Tab 5 MG: 5 mg | ORAL | NDC 69584009110

## 2024-12-09 NOTE — Progress Notes (Signed)
° ° ° °  Patient Name: Felicia Washington           DOB: 1946/03/12  MRN: [id number]      Admission Date: 12/07/2024  Attending Provider: Austria, Eric J, DO  Primary Diagnosis: Hyponatremia   Level of care: Progressive   OVERNIGHT EVENT   HPI/ Events of Note Felicia Washington, 78 y.o. female, was admitted on 12/07/2024 for Hyponatremia.   Hypovolemic hyponatremia- Patient presenting with confusion x 1 week.  Waxing and waning orientation. Notified by RN that pt is restless and confused.  She is wandering around in the unit and is entering other pt rooms.  RN staff attempted to redirect her, but she has become physically aggressive with staff and is removing medical equipment.   Unfortunately, she is not following commands despite multiple attempts at redirection by staff.    Plan: Given level of restlessness patient was administered 1 mg IV Ativan .   Restraint use started, however will discontinued as soon as patient is able to safely have them removed. BMP   Lavanda Horns, DNP, ACNPC- AG [facility] [place]

## 2024-12-09 NOTE — Progress Notes (Signed)
 Patient has becoming increasingly more agitated despite dose of Haldol . Kicking and attempting to hit staff. Sustained a scratch on left middle finger during incident - photo taken, LDA created and bandage placed over abrasion.   Placed back in belt restraint and wrist restraints. Son at bedside. Dr. Austria on unit rounding. Assessed at bedside, orders for soft restraints placed.

## 2024-12-09 NOTE — Discharge Summary (Signed)
 Physician Discharge Summary  Felicia Washington FMW:[id number] DOB: Dec 20, 1946 DOA: 12/07/2024  PCP: Chinita Hoy CROME, PA-C  Admit date: 12/07/2024 Discharge date: 12/09/2024  Admitted From: Home Disposition: Home  Recommendations for Outpatient Follow-up:  Follow up with PCP in 1-2 weeks Follow-up with endocrinology, Dr. Nichole 1-2 weeks; suspect will need adjustments in her thyroid  medicine given elevated TSH and low free T4; discussed with Dr. Nichole and advised that he will follow-up with patient regarding this Recommend outpatient referral to neurology versus neuropsychology for dementia evaluation Please obtain BMP in one week to recheck sodium level   Home Health: No Equipment/Devices: None  Discharge Condition: Stable CODE STATUS: Full code Diet recommendation: Regular diet  History of present illness:  Felicia Washington is a 78 y.o. 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  Was seen by PCP earlier in the day with labs notable for sodium of 125.  Onset of confusion for roughly 1 week.  Denies headache, no nausea/vomiting, no diarrhea.   In the ED, temperature 98.3 F, HR 67, RR 17, BP 109/73, SpO2 100% on room air.  WBC 7.2, hemoglobin 10.0, platelet count 202.  Sodium 126, potassium 4.0, chloride 93, CO2 21, glucose 92, BUN 25, creatinine 0.94.  Serum osmolality 265.  Urine osmolality 138, urine sodium 32.  TSH is 86.800, free T4 0.38.  Chest x-ray with no active cardiopulmonary disease process.  Patient was started on IV normal saline.  TRH was consulted for admission and patient was transferred to [facility] for further evaluation management.  Hospital course:  Hypovolemic  hyponatremia Patient presenting with confusion x 1 week.  Was seen by PCP with sodium level of 125. (Most recent level 138 on 09/29/2024).  Serum osmolality 265, urine osmolality 138, urine sodium 32.  TSH elevated 86.800 with free T4 0.38.  Patient was started on IV fluid hydration with improvement of sodium to 132 at time of discharge.  Recommend to continue to encourage increased oral intake, Gatorade use with repeat BMP 1 week.  Outpatient follow-up with PCP 1 week.   Paroxysmal atrial fibrillation HTN Metoprolol  succinate, amlodipine -olmesartan , Eliquis    Hypothyroidism Follows with endocrinology outpatient, Dr. Nichole.  TSH elevated 86.800 with free T40.38.  Home dose of levothyroxine  112 mcg M/T/W/T/F/Sat in 224 mcg every Sunday.  Discussed with endocrinology, Dr. Nichole who will follow-up with patient regarding medication changes.  He recommends to continue dose of levothyroxine  for now.   Asthma Montelukast     HLD Atorvastatin  40 mg p.o. daily, Fenofibrate     Generalized anxiety disorder BuSpar  5 mg p.o. twice daily; no longer taking clonazepam .  Suspicion for cognitive impairment versus mild dementia with behavioral disturbance Hospital-acquired delirium During hospitalization, patient's at nighttime became agitated, wandering the halls.  Likely secondary to increased stress from hospitalization and sundowning.  Although patient is alert and oriented to person/place/time and situation suspect that stressful environment/situations may bring about behavioral disturbances.  Recommend outpatient follow-up with PCP, referral to neurology versus neuro psychology for official dementia testing.   Discharge Diagnoses:  Principal Problem:   Hyponatremia    Discharge Instructions  Discharge Instructions     Call MD for:  difficulty breathing, headache or visual disturbances   Complete by: As directed    Call MD for:  extreme fatigue   Complete by:  As directed    Call MD for:   persistant dizziness or light-headedness   Complete by: As directed    Call MD for:  persistant nausea and vomiting   Complete by: As directed    Call MD for:  severe uncontrolled pain   Complete by: As directed    Call MD for:  temperature >100.4   Complete by: As directed    Increase activity slowly   Complete by: As directed    No wound care   Complete by: As directed       Allergies as of 12/09/2024       Reactions   Adhesive [tape] Other (See Comments)   Paper tape only   Allopurinol  Hives, Itching, Swelling   Codeine Nausea Only   Latex Other (See Comments)   Removes skin   Wheat Nausea And Vomiting, Other (See Comments)   Patient has ceiliac         Medication List     STOP taking these medications    amLODipine  10 MG tablet Commonly known as: NORVASC    buPROPion  75 MG tablet Commonly known as: WELLBUTRIN    chlorthalidone  25 MG tablet Commonly known as: HYGROTON    clonazePAM  0.5 MG tablet Commonly known as: KLONOPIN    escitalopram 10 MG tablet Commonly known as: LEXAPRO   escitalopram 5 MG tablet Commonly known as: LEXAPRO   famotidine  20 MG tablet Commonly known as: PEPCID    olmesartan  40 MG tablet Commonly known as: BENICAR    oxyCODONE  5 MG immediate release tablet Commonly known as: Oxy IR/ROXICODONE    potassium chloride  SA 20 MEQ tablet Commonly known as: KLOR-CON  M   Wellbutrin  XL 150 MG 24 hr tablet Generic drug: buPROPion        TAKE these medications    acetaminophen  500 MG tablet Commonly known as: TYLENOL  Take 1,000 mg by mouth every 6 (six) hours as needed for mild pain (pain score 1-3).   albuterol  108 (90 Base) MCG/ACT inhaler Commonly known as: VENTOLIN  HFA Inhale 2 puffs into the lungs every 8 (eight) hours as needed for shortness of breath or wheezing.   amLODipine -olmesartan  10-40 MG tablet Commonly known as: AZOR Take 1 tablet by mouth daily.   apixaban  5 MG Tabs tablet Commonly known as: ELIQUIS  Take 1  tablet (5 mg total) by mouth 2 (two) times daily.   atorvastatin  40 MG tablet Commonly known as: LIPITOR Take 40 mg by mouth in the morning.   busPIRone  5 MG tablet Commonly known as: BUSPAR  Take 5 mg by mouth 2 (two) times daily.   fenofibrate  micronized 67 MG capsule Commonly known as: LOFIBRA Take 67 mg by mouth in the morning.   levothyroxine  112 MCG tablet Commonly known as: SYNTHROID  Take 112-224 mcg by mouth See admin instructions. Take 1 tablets (112 mcg) by mouth on Mondays through Saturdays before breakfast. Take 2tablest (224 mcg) by mouth on Sundays before breakfast.   metoprolol  succinate 50 MG 24 hr tablet Commonly known as: Toprol  XL Take 1 tablet (50 mg total) by mouth at bedtime.   montelukast  10 MG tablet Commonly known as: SINGULAIR  Take 10 mg by mouth at bedtime.   potassium chloride  10 MEQ tablet Commonly known as: KLOR-CON  Take 10 mEq by mouth daily.        Follow-up Information     Chinita Hoy CROME, PA-C. Schedule an appointment as soon as possible for a visit in 1 week(s).   Specialty: Physician Assistant Contact information: [address] [place]  [place] [address] [phone]         Nichole Senior, MD. Schedule an appointment as soon as possible for a visit.   Specialty: Endocrinology Contact information: [address] [place] [address] [phone]                Allergies[1]  Consultations: None   Procedures/Studies: DG Chest Portable 1 View Result Date: 12/07/2024 CLINICAL DATA:  Cough. EXAM: PORTABLE CHEST 1 VIEW COMPARISON:  Chest radiograph dated 08/18/2024. FINDINGS: No focal consolidation, pleural effusion, pneumothorax. The cardiac silhouette is within limits. Loop recorder device. Previously seen hiatal hernia is no longer visualized. Osteopenia with degenerative changes. No acute osseous pathology. Right shoulder arthroplasty. IMPRESSION: No active disease. Electronically Signed   By:  Vanetta Chou M.D.   On: 12/07/2024 17:55   CUP PACEART REMOTE DEVICE CHECK Result Date: 11/24/2024 ILR summary report received. Battery status OK. Normal device function. No new symptom, tachy, brady, or pause episodes. 2 new AF episodes, longest 14 minutes, some RVR is noted. Known AF, on Eliquis  per Epic. Monthly summary reports and ROV/PRN - CS, CVRS    Subjective: Patient seen examined bedside, lying in bed.  Restraints were placed overnight due to agitation, patient wandering halls into other patients rooms.  Patient remains alert and oriented to person/place/time and situation.  Family present at bedside.  Discussed with them regarding that continued hospitalization will likely increase her agitation due to hospital-acquired delirium, lack of sleep and underlying dementia.  At this moment patient possesses the medical capacity to make her decisions as she remains oriented.  Patient desires discharge home.  Discussed with family and patient this is reasonable given improvement of her sodium.  Did discuss with other family members that would recommend outpatient follow-up with PCP, with referral to neurology versus neuropsychology for evaluation of possible underlying mild dementia.  Patient requesting to use the restroom.  Discharging home.  Discharge Exam: Vitals:   12/08/24 1427 12/08/24 2000  BP: 121/70 127/81  Pulse: 72   Resp: 18 17  Temp: 98.2 F (36.8 C) 98 F (36.7 C)  SpO2: 100% 100%   Vitals:   12/08/24 0752 12/08/24 1116 12/08/24 1427 12/08/24 2000  BP: 110/73 118/86 121/70 127/81  Pulse: 81 77 72   Resp: 18 18 18 17   Temp: 99.1 F (37.3 C) 98.1 F (36.7 C) 98.2 F (36.8 C) 98 F (36.7 C)  TempSrc: Oral Oral Oral   SpO2: 100% 100% 100% 100%  Weight:      Height:        Physical Exam: GEN: NAD, alert and oriented x 4 to person ([place]), Place [facility]), Time (2025) and situation, elderly in appearance HEENT: NCAT, PERRL, EOMI, sclera clear,  MMM PULM: CTAB w/o wheezes/crackles, normal respiratory effort, on room air CV: RRR w/o M/G/R GI: abd soft, NTND, + BS MSK: no peripheral edema, moves l extremities independently with preserved muscle strength NEURO: No focal neurological deficit PSYCH: Depressed mood, flat affect Integumentary: Ecchymosis to bilateral upper extremities in various stages of healing, right middle finger abrasion as depicted below, otherwise no other concerning rashes/lesions/wounds noted on exposed skin surfaces    The results of significant diagnostics from this hospitalization (including imaging, microbiology, ancillary and laboratory) are listed below for reference.     Microbiology: No results found for this or any previous visit (from the past 240 hours).   Labs: BNP (last 3 results) No results for input(s): BNP in the last 8760 hours. Basic Metabolic Panel:  Recent Labs  Lab 12/07/24 1730 12/08/24 0452 12/08/24 1230 12/08/24 2046 12/09/24 0808  NA 126* 130* 131* 131* 132*  K 4.0 4.2  --   --  3.9  CL 93* 101  --   --  99  CO2 21* 19*  --   --  24  GLUCOSE 92 120*  --   --  85  BUN 25* 21  --   --  12  CREATININE 0.94 0.97  --   --  0.85  CALCIUM  10.0 8.9  --   --  9.6  MG  --  2.0  --   --   --   PHOS  --  2.7  --   --   --    Liver Function Tests: No results for input(s): AST, ALT, ALKPHOS, BILITOT, PROT, ALBUMIN  in the last 168 hours. No results for input(s): LIPASE, AMYLASE in the last 168 hours. No results for input(s): AMMONIA in the last 168 hours. CBC: Recent Labs  Lab 12/07/24 1730  WBC 7.2  NEUTROABS 4.4  HGB 10.0*  HCT 28.8*  MCV 89.7  PLT 202   Cardiac Enzymes: No results for input(s): CKTOTAL, CKMB, CKMBINDEX, TROPONINI in the last 168 hours. BNP: Invalid input(s): POCBNP CBG: No results for input(s): GLUCAP in the last 168 hours. D-Dimer No results for input(s): DDIMER in the last 72 hours. Hgb A1c No results for  input(s): HGBA1C in the last 72 hours. Lipid Profile No results for input(s): CHOL, HDL, LDLCALC, TRIG, CHOLHDL, LDLDIRECT in the last 72 hours. Thyroid  function studies Recent Labs    12/08/24 0452  TSH 86.800*   Anemia work up No results for input(s): VITAMINB12, FOLATE, FERRITIN, TIBC, IRON, RETICCTPCT in the last 72 hours. Urinalysis    Component Value Date/Time   COLORURINE YELLOW 08/18/2024 1842   APPEARANCEUR HAZY (A) 08/18/2024 1842   APPEARANCEUR Clear 11/09/2022 1006   LABSPEC 1.017 08/18/2024 1842   PHURINE 8.0 08/18/2024 1842   GLUCOSEU 50 (A) 08/18/2024 1842   HGBUR NEGATIVE 08/18/2024 1842   BILIRUBINUR NEGATIVE 08/18/2024 1842   BILIRUBINUR Negative 11/09/2022 1006   KETONESUR 5 (A) 08/18/2024 1842   PROTEINUR 100 (A) 08/18/2024 1842   NITRITE NEGATIVE 08/18/2024 1842   LEUKOCYTESUR NEGATIVE 08/18/2024 1842   Sepsis Labs Recent Labs  Lab 12/07/24 1730  WBC 7.2   Microbiology No results found for this or any previous visit (from the past 240 hours).   Time coordinating discharge: Over 30 minutes  SIGNED:   Camellia PARAS Oluwatosin Higginson, DO  [facility] 12/09/2024, 10:09 AM     [1]  Allergies Allergen Reactions   Adhesive [Tape] Other (See Comments)    Paper tape only   Allopurinol  Hives, Itching and Swelling   Codeine Nausea Only   Latex Other (See Comments)    Removes skin   Wheat Nausea And Vomiting and Other (See Comments)    Patient has ceiliac
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
## 2024-12-09 NOTE — Progress Notes (Signed)
 Shift assessment completed. A&O x4 , answers questions appropriately despite irritability and anxiousness. Standby assist to [facility] and placed back in bed. Patient adamant about wanting to leave. Removed belt restraint and mittens as the patient is redirectable after some effort. Son at bedside. Given dose of haldol  as an attempt to keep the patient in the bed and out of restraints.

## 2024-12-10 LAB — T3: T3, Total: 36 ng/dL — ABNORMAL LOW (ref 71–180)

## 2024-12-23 ENCOUNTER — Ambulatory Visit (INDEPENDENT_AMBULATORY_CARE_PROVIDER_SITE_OTHER)

## 2024-12-23 DIAGNOSIS — I480 Paroxysmal atrial fibrillation: Secondary | ICD-10-CM | POA: Diagnosis not present

## 2024-12-25 LAB — CUP PACEART REMOTE DEVICE CHECK
Date Time Interrogation Session: 20251227001800
Implantable Pulse Generator Implant Date: 20230214
Pulse Gen Serial Number: 172346

## 2024-12-27 ENCOUNTER — Ambulatory Visit: Payer: Self-pay | Admitting: Student in an Organized Health Care Education/Training Program

## 2024-12-27 NOTE — Progress Notes (Signed)
 Remote Loop Recorder Transmission

## 2024-12-29 ENCOUNTER — Other Ambulatory Visit: Payer: Self-pay | Admitting: Physician Assistant

## 2024-12-29 DIAGNOSIS — I10 Essential (primary) hypertension: Secondary | ICD-10-CM

## 2025-01-02 ENCOUNTER — Encounter: Admitting: Internal Medicine

## 2025-01-23 ENCOUNTER — Encounter

## 2025-01-29 ENCOUNTER — Ambulatory Visit: Admitting: Cardiology

## 2025-02-23 ENCOUNTER — Encounter

## 2025-03-13 ENCOUNTER — Ambulatory Visit: Admitting: Neurology

## 2025-03-23 ENCOUNTER — Ambulatory Visit: Admitting: Cardiology

## 2025-03-26 ENCOUNTER — Encounter

## 2025-04-26 ENCOUNTER — Encounter

## 2025-05-27 ENCOUNTER — Encounter

## 2025-06-27 ENCOUNTER — Encounter

## 2025-07-28 ENCOUNTER — Encounter

## 2025-08-28 ENCOUNTER — Encounter

## 2025-09-28 ENCOUNTER — Encounter

## 2025-10-29 ENCOUNTER — Encounter
